# Patient Record
Sex: Female | Born: 1970 | Race: White | Hispanic: No | Marital: Single | Smoking: Current every day smoker
Health system: Southern US, Community
[De-identification: ages and names within clinical notes are randomized; demographics above are authoritative.]

## PROBLEM LIST (undated history)

## (undated) DIAGNOSIS — J45909 Unspecified asthma, uncomplicated: Secondary | ICD-10-CM

## (undated) DIAGNOSIS — I509 Heart failure, unspecified: Secondary | ICD-10-CM

## (undated) DIAGNOSIS — E119 Type 2 diabetes mellitus without complications: Secondary | ICD-10-CM

## (undated) DIAGNOSIS — D649 Anemia, unspecified: Secondary | ICD-10-CM

## (undated) DIAGNOSIS — I10 Essential (primary) hypertension: Secondary | ICD-10-CM

## (undated) DIAGNOSIS — D7389 Other diseases of spleen: Secondary | ICD-10-CM

## (undated) DIAGNOSIS — J449 Chronic obstructive pulmonary disease, unspecified: Secondary | ICD-10-CM

## (undated) DIAGNOSIS — I890 Lymphedema, not elsewhere classified: Secondary | ICD-10-CM

## (undated) HISTORY — DX: Heart failure, unspecified: I50.9

## (undated) HISTORY — DX: Other diseases of spleen: D73.89

## (undated) HISTORY — DX: Chronic obstructive pulmonary disease, unspecified: J44.9

## (undated) HISTORY — DX: Essential (primary) hypertension: I10

## (undated) HISTORY — DX: Type 2 diabetes mellitus without complications: E11.9

## (undated) HISTORY — DX: Anemia, unspecified: D64.9

## (undated) HISTORY — DX: Lymphedema, not elsewhere classified: I89.0

---

## 2017-02-22 ENCOUNTER — Emergency Department: Payer: Self-pay

## 2017-02-22 ENCOUNTER — Encounter: Payer: Self-pay | Admitting: Emergency Medicine

## 2017-02-22 ENCOUNTER — Emergency Department
Admission: EM | Admit: 2017-02-22 | Discharge: 2017-02-22 | Disposition: A | Discharge status: Home / Self Care | Payer: Self-pay | Attending: Emergency Medicine | Admitting: Emergency Medicine

## 2017-02-22 DIAGNOSIS — R0602 Shortness of breath: Secondary | ICD-10-CM | POA: Insufficient documentation

## 2017-02-22 LAB — URINALYSIS, ROUTINE W REFLEX MICROSCOPIC
BACTERIA UA: NONE SEEN
BILIRUBIN URINE: NEGATIVE
Glucose, UA: NEGATIVE mg/dL
KETONES UR: NEGATIVE mg/dL
NITRITE: NEGATIVE
PROTEIN: NEGATIVE mg/dL
Specific Gravity, Urine: 1.006 (ref 1.005–1.030)
pH: 5 (ref 5.0–8.0)

## 2017-02-22 LAB — CBC
HEMATOCRIT: 31 % — AB (ref 35.0–47.0)
Hemoglobin: 9.5 g/dL — ABNORMAL LOW (ref 12.0–16.0)
MCH: 22.2 pg — ABNORMAL LOW (ref 26.0–34.0)
MCHC: 30.8 g/dL — ABNORMAL LOW (ref 32.0–36.0)
MCV: 72 fL — ABNORMAL LOW (ref 80.0–100.0)
Platelets: 377 10*3/uL (ref 150–440)
RBC: 4.31 MIL/uL (ref 3.80–5.20)
RDW: 19.1 % — AB (ref 11.5–14.5)
WBC: 7.5 10*3/uL (ref 3.6–11.0)

## 2017-02-22 LAB — COMPREHENSIVE METABOLIC PANEL
ALBUMIN: 4.4 g/dL (ref 3.5–5.0)
ALK PHOS: 55 U/L (ref 38–126)
ALT: 12 U/L — AB (ref 14–54)
ANION GAP: 7 (ref 5–15)
AST: 18 U/L (ref 15–41)
BUN: 9 mg/dL (ref 6–20)
CALCIUM: 8.9 mg/dL (ref 8.9–10.3)
CO2: 25 mmol/L (ref 22–32)
Chloride: 106 mmol/L (ref 101–111)
Creatinine, Ser: 0.69 mg/dL (ref 0.44–1.00)
GFR calc Af Amer: >60 mL/min (ref >60)
GFR calc non Af Amer: >60 mL/min (ref >60)
GLUCOSE: 97 mg/dL (ref 65–99)
Potassium: 3.7 mmol/L (ref 3.5–5.1)
SODIUM: 138 mmol/L (ref 135–145)
Total Bilirubin: 0.6 mg/dL (ref 0.3–1.2)
Total Protein: 8.3 g/dL — ABNORMAL HIGH (ref 6.5–8.1)

## 2017-02-22 LAB — TROPONIN I
Troponin I: <0.03 ng/mL (ref <0.03)
Troponin I: <0.03 ng/mL (ref <0.03)

## 2017-02-22 NOTE — ED Provider Notes (Signed)
[facility] Emergency Department Provider Note  Time seen: 4:27 PM  I have reviewed the triage vital signs and the nursing notes.   HISTORY  Chief Complaint Shortness of Breath    HPI [name] is a 46 y.o. female who presents to the emergency department for shortness of breath. According to the patient for the past 3 days while ambulating she become short of breath and feels like she needs to stop and rest. Denies any chest pain. Denies any leg pain or swelling. Patient states she used to have similar symptoms when she was smoking however she quit 10 years ago. Denies any fever or increased cough or congestion. Denies any shortness of breath while lying in bed but states if she begins walking or exerting herself she becomes very short of breath.  History reviewed. No pertinent past medical history.  There are no active problems to display for this patient.   History reviewed. No pertinent surgical history.  Prior to Admission medications   Not on File    No Known Allergies  No family history on file.  Social History Social History  Substance Use Topics  . Smoking status: Former Games developermoker  . Smokeless tobacco: Not on file  . Alcohol use Not on file    Review of Systems Constitutional: Negative for fever. Cardiovascular: Negative for chest pain. Respiratory: Positive for shortness of breath with exertion, none at rest. Gastrointestinal: Negative for abdominal pain GU: Positive for Mild dysuria. Musculoskeletal: Negative for back pain. Negative for leg pain. Neurological: Negative for headache All other ROS negative  ____________________________________________   PHYSICAL EXAM:  VITAL SIGNS: ED Triage Vitals  Enc Vitals Group     BP 02/22/17 1351 131/61     Pulse Rate 02/22/17 1351 (!) 58     Resp 02/22/17 1351 20     Temp 02/22/17 1351 98 F (36.7 C)     Temp Source 02/22/17 1351 Oral     SpO2 02/22/17 1257 98 %     Weight  02/22/17 1351 275 lb (124.7 kg)     Height 02/22/17 1351 5\' 1"  (1.549 m)     Head Circumference --      Peak Flow --      Pain Score --      Pain Loc --      Pain Edu? --      Excl. in GC? --     Constitutional: Alert and oriented. Well appearing and in no distress. Eyes: Normal exam ENT   Head: Normocephalic and atraumatic.   Mouth/Throat: Mucous membranes are moist. Cardiovascular: Normal rate, regular rhythm. No murmur Respiratory: Normal respiratory effort without tachypnea nor retractions. Breath sounds are clear  Gastrointestinal: Soft and nontender. No distention.  Obese. Musculoskeletal: Nontender with normal range of motion in all extremities. No lower extremity tenderness. Mild nonpitting edema bilaterally. Neurologic:  Normal speech and language. No gross focal neurologic deficits  Skin:  Skin is warm, dry and intact.  Psychiatric: Mood and affect are normal.  ____________________________________________    EKG  EKG reviewed and interpreted by myself shows sinus bradycardia at 55 bpm, narrow QRS, normal axis, normal intervals, no ST changes. Normal EKG.  ____________________________________________    RADIOLOGY  Chest x-ray is negative  ____________________________________________   INITIAL IMPRESSION / ASSESSMENT AND PLAN / ED COURSE  Pertinent labs & imaging results that were available during my care of the patient were reviewed by me and considered in my medical decision making (see chart for  details).  Patient presents to the emergency department for 3 days of shortness of breath with minimal exertion. Denies any short of breath at rest. Denies any chest pain at any point. Overall the patient appears well. Clear lung sounds, normal respiratory rate, normal heart rate. 99-100% room air saturation. Patient denies any history of DVT/PE. Denies any increase in swelling in her legs. No recent surgery or immobilizations. Denies cough or estrogen use.  Patient is PERC negative. Given the patient's complaint of shortness breath with minimal exertion we will ambulate with a pulse oximeter. We'll obtain a repeat troponin. On review of systems the patient also states mild dysuria we will check a urinalysis sample. Patient agreeable to this plan.  Patient maintained a 98% room air saturation throughout ambulation. Repeat troponin is negative. I believe the patient is safe for discharge home. We will have the patient follow up with cardiology for further evaluation and possible stress test.  Patient agreeable to this plan. I discussed my normal chest pain/shortness breath return precautions. ____________________________________________   FINAL CLINICAL IMPRESSION(S) / ED DIAGNOSES  Dyspnea    Minna AntisPaduchowski, Darbi Chandran, MD 02/22/17 1742

## 2017-02-22 NOTE — ED Triage Notes (Signed)
States SOB with even slight exertion x 3 days. Denies pain or fevers. Denies cough. No unusual swelling in legs.

## 2017-02-22 NOTE — Discharge Instructions (Signed)
You have been seen in the emergency department today for shortness of breath. Your workup has shown normal results. As we discussed please follow-up with your primary care physician in the next 1-2 days for recheck. Return to the emergency department for any chest pain, worsening trouble breathing, or any other symptom personally concerning to yourself.  Please call the number provided for cardiology to arrange a follow-up appointments as possible for further evaluation and consideration of a stress test.

## 2017-05-04 DIAGNOSIS — E669 Obesity, unspecified: Secondary | ICD-10-CM | POA: Insufficient documentation

## 2017-05-04 DIAGNOSIS — R600 Localized edema: Secondary | ICD-10-CM | POA: Insufficient documentation

## 2017-05-04 DIAGNOSIS — D509 Iron deficiency anemia, unspecified: Secondary | ICD-10-CM | POA: Insufficient documentation

## 2017-05-04 DIAGNOSIS — R0789 Other chest pain: Secondary | ICD-10-CM | POA: Insufficient documentation

## 2017-05-04 DIAGNOSIS — R0600 Dyspnea, unspecified: Secondary | ICD-10-CM | POA: Insufficient documentation

## 2017-07-14 DIAGNOSIS — R7303 Prediabetes: Secondary | ICD-10-CM | POA: Insufficient documentation

## 2018-06-01 DIAGNOSIS — G4733 Obstructive sleep apnea (adult) (pediatric): Secondary | ICD-10-CM | POA: Insufficient documentation

## 2018-07-01 ENCOUNTER — Encounter: Payer: Self-pay | Admitting: Emergency Medicine

## 2018-07-01 ENCOUNTER — Emergency Department
Admission: EM | Admit: 2018-07-01 | Discharge: 2018-07-01 | Disposition: A | Discharge status: Home / Self Care | Payer: Self-pay | Attending: Emergency Medicine | Admitting: Emergency Medicine

## 2018-07-01 ENCOUNTER — Other Ambulatory Visit: Payer: Self-pay

## 2018-07-01 ENCOUNTER — Emergency Department: Payer: Self-pay

## 2018-07-01 DIAGNOSIS — J45909 Unspecified asthma, uncomplicated: Secondary | ICD-10-CM | POA: Insufficient documentation

## 2018-07-01 DIAGNOSIS — Z7984 Long term (current) use of oral hypoglycemic drugs: Secondary | ICD-10-CM | POA: Insufficient documentation

## 2018-07-01 DIAGNOSIS — M1711 Unilateral primary osteoarthritis, right knee: Secondary | ICD-10-CM | POA: Insufficient documentation

## 2018-07-01 DIAGNOSIS — M25561 Pain in right knee: Secondary | ICD-10-CM | POA: Insufficient documentation

## 2018-07-01 DIAGNOSIS — Z87891 Personal history of nicotine dependence: Secondary | ICD-10-CM | POA: Insufficient documentation

## 2018-07-01 HISTORY — DX: Unspecified asthma, uncomplicated: J45.909

## 2018-07-01 LAB — GLUCOSE, CAPILLARY: GLUCOSE-CAPILLARY: 103 mg/dL — AB (ref 70–99)

## 2018-07-01 MED ORDER — PREDNISONE 10 MG PO TABS: ORAL_TABLET | ORAL | 0 refills | Status: DC

## 2018-07-01 MED ORDER — HYDROCODONE-ACETAMINOPHEN 5-325 MG PO TABS: 1.0000 | ORAL_TABLET | Freq: Four times a day (QID) | ORAL | 0 refills | Status: DC | PRN

## 2018-07-01 NOTE — ED Triage Notes (Signed)
Pt to ED via POV c/o right knee pain. Pt states that she was trying to get into the bath tub last night and felt something pop in her right knee. When pt got up this morning she was not able to put a lot of pressure on her right knee. Pain has continued to get worse as the day has went on. Pt limping. Pt is in NAD at this time.

## 2018-07-01 NOTE — ED Provider Notes (Signed)
[facility] Emergency Department Provider Note  ____________________________________________   First MD Initiated Contact with Patient 07/01/18 1634     (approximate)  I have reviewed the triage vital signs and the nursing notes.   HISTORY  Chief Complaint Knee Pain   HPI [name] is a 47 y.o. female resents to the ED with complaint of right knee pain.  Patient states that she was trying to get into the bathtub last evening and felt a pop in her right knee.  She denies any previous injury to her knee.  She states that weightbearing increases her pain.  She states that she was at work today when she "could not stand it anymore".  Patient states that she has been limping and taking Goody powders without any relief of her pain.  Currently she rates her pain as 10/10.   Past Medical History:  Diagnosis Date  . Asthma     There are no active problems to display for this patient.   History reviewed. No pertinent surgical history.  Prior to Admission medications   Medication Sig Start Date End Date Taking? Authorizing Provider  Albuterol Sulfate 108 (90 Base) MCG/ACT AEPB Inhale into the lungs.   Yes [provider]  metFORMIN (GLUCOPHAGE) 500 MG tablet Take 500 mg by mouth 2 (two) times daily with a meal.   Yes [provider]  HYDROcodone-acetaminophen (NORCO/VICODIN) 5-325 MG tablet Take 1 tablet by mouth every 6 (six) hours as needed for moderate pain. 07/01/18   Tommi Rumps, PA-C  predniSONE (DELTASONE) 10 MG tablet Take 4 tablets for 2 days, 3 tablets for 2 days, 2 tablets for 2 days, 1 tablet for 2 days. 07/01/18   Tommi Rumps, PA-C    Allergies Patient has no known allergies.  No family history on file.  Social History Social History   Tobacco Use  . Smoking status: Former Games developer  . Smokeless tobacco: Never Used  Substance Use Topics  . Alcohol use: Not Currently  . Drug use: Not Currently    Review of  Systems Constitutional: No fever/chills Cardiovascular: Denies chest pain. Respiratory: Denies shortness of breath. Gastrointestinal: No abdominal pain.  No nausea, no vomiting.   Musculoskeletal: Positive for right knee pain. Skin: Negative for rash. Neurological: Negative for headaches, focal weakness or numbness.  ____________________________________________   PHYSICAL EXAM:  VITAL SIGNS: ED Triage Vitals  Enc Vitals Group     BP 07/01/18 1623 (!) 130/52     Pulse Rate 07/01/18 1623 87     Resp 07/01/18 1623 16     Temp 07/01/18 1623 97.8 F (36.6 C)     Temp Source 07/01/18 1623 Oral     SpO2 07/01/18 1623 98 %     Weight 07/01/18 1624 (!) 310 lb (140.6 kg)     Height 07/01/18 1624 5\' 1"  (1.549 m)     Head Circumference --      Peak Flow --      Pain Score 07/01/18 1624 10     Pain Loc --      Pain Edu? --      Excl. in GC? --    Constitutional: Alert and oriented. Well appearing and in no acute distress. Eyes: Conjunctivae are normal. PERRL. EOMI. Neck: No stridor.   Cardiovascular: Normal rate, regular rhythm. Grossly normal heart sounds.  Good peripheral circulation. Respiratory: Normal respiratory effort.  No retractions. Lungs CTAB. Gastrointestinal: Soft and nontender. No distention.  Obese. Musculoskeletal: On examination  of the right knee there is no gross deformity however patient is morbidly obese and landmarks are difficult to find.  Range of motion is restricted secondary to patient's pain.  There is some crepitus noted with range of motion.  No effusion is present.  Skin is intact and no discoloration or warmth is appreciated. Neurologic:  Normal speech and language. No gross focal neurologic deficits are appreciated.  Skin:  Skin is warm, dry and intact.  Psychiatric: Mood and affect are normal. Speech and behavior are normal.  ____________________________________________   LABS (all labs ordered are listed, but only abnormal results are  displayed)  Labs Reviewed  GLUCOSE, CAPILLARY - Abnormal; Notable for the following components:      Result Value   Glucose-Capillary 103 (*)    All other components within normal limits  CBG MONITORING, ED    RADIOLOGY  ED MD interpretation:  Right knee x-ray is negative for acute bony injury but does show degenerative changes.  Official radiology report(s): Dg Knee Complete 4 Views Right  Result Date: 07/01/2018 CLINICAL DATA:  47 year old female with right knee pain and popping EXAM: RIGHT KNEE - COMPLETE 4+ VIEW COMPARISON:  None. FINDINGS: No evidence of acute fracture or malalignment. 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 No evidence of knee joint effusion. No focal soft tissue abnormality. 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 Electronically Signed   By: Malachy MoanHeath  McCullough M.D.   On: 07/01/2018 17:03    ____________________________________________   PROCEDURES  Procedure(s) performed: None  Procedures  Critical Care performed: No  ____________________________________________   INITIAL IMPRESSION / ASSESSMENT AND PLAN / ED COURSE  As part of my medical decision making, I reviewed the following data within the electronic MEDICAL RECORD NUMBER Notes from prior ED visits and [place] Controlled Substance Database  Patient presents to the ED with complaint of right knee pain since trying to get into the bathtub last evening.  She states that it popped and since that time she has had pain.  She has been taking multiple BC powders without any relief.  Patient denies any previous injury to her knee.  She is still ambulatory but with a limp.  On examination of the knee landmarks are difficult to determine due to patient habitus.  Crepitus is appreciated with restricted range of motion  secondary to pain.  X-ray of the right knee confirms tricompartmentmental degenerative osteoarthritis.  Patient was placed in a knee immobilizer for support for limited use.  She is also given a prescription for prednisone tapering dose starting at 40 mg and Norco as needed for pain.  She is encouraged to ice and elevate this evening.  She was given a note to remain out of work this weekend.  She is to follow-up with Dr. Odis LusterBowers who is the orthopedist on-call.   ____________________________________________   FINAL CLINICAL IMPRESSION(S) / ED DIAGNOSES  Final diagnoses:  Acute pain of right knee  Osteoarthritis of right knee, unspecified osteoarthritis type     ED Discharge Orders         Ordered    predniSONE (DELTASONE) 10 MG tablet     07/01/18 1734    HYDROcodone-acetaminophen (NORCO/VICODIN) 5-325 MG tablet  Every 6 hours PRN     07/01/18 1734           Note:  This document was prepared using Dragon voice recognition software and  may include unintentional dictation errors.    Tommi Rumps, PA-C 07/01/18 1741    Phineas Semen, MD 07/01/18 2008

## 2018-07-01 NOTE — Discharge Instructions (Signed)
Follow-up with Dr. Odis LusterBowers or orthopedist of your choice if any continued problems with your knee.  Ice and elevation tonight.  Wear knee immobilizer for support.  You may take this off to sleep but should be worn anytime you are up walking.  [facility] or Goody powders as this will cause some stomach upset and you are taking too many.  Begin taking the prednisone as directed and if severe pain take Norco.  Do not take at the pain medication, Norco, if you are driving or working.

## 2019-08-25 ENCOUNTER — Ambulatory Visit: Payer: Self-pay | Attending: Internal Medicine

## 2019-08-25 DIAGNOSIS — Z20822 Contact with and (suspected) exposure to covid-19: Secondary | ICD-10-CM | POA: Insufficient documentation

## 2019-08-26 LAB — NOVEL CORONAVIRUS, NAA: SARS-CoV-2, NAA: NOT DETECTED

## 2019-12-07 ENCOUNTER — Emergency Department
Admission: EM | Admit: 2019-12-07 | Discharge: 2019-12-07 | Disposition: A | Discharge status: Home / Self Care | Payer: Self-pay | Attending: Emergency Medicine | Admitting: Emergency Medicine

## 2019-12-07 ENCOUNTER — Emergency Department: Payer: Self-pay

## 2019-12-07 ENCOUNTER — Other Ambulatory Visit: Payer: Self-pay

## 2019-12-07 ENCOUNTER — Encounter: Payer: Self-pay | Admitting: Emergency Medicine

## 2019-12-07 DIAGNOSIS — Z79899 Other long term (current) drug therapy: Secondary | ICD-10-CM | POA: Insufficient documentation

## 2019-12-07 DIAGNOSIS — J45909 Unspecified asthma, uncomplicated: Secondary | ICD-10-CM | POA: Insufficient documentation

## 2019-12-07 DIAGNOSIS — Z87891 Personal history of nicotine dependence: Secondary | ICD-10-CM | POA: Insufficient documentation

## 2019-12-07 DIAGNOSIS — M25561 Pain in right knee: Secondary | ICD-10-CM | POA: Insufficient documentation

## 2019-12-07 MED ORDER — LIDOCAINE 5 % EX PTCH
1.0000 | MEDICATED_PATCH | CUTANEOUS | Status: DC
  Administered 2019-12-07: 1 via TRANSDERMAL
  Filled 2019-12-07: qty 1

## 2019-12-07 MED ORDER — TRAMADOL HCL 50 MG PO TABS
50.0000 mg | ORAL_TABLET | Freq: Once | ORAL | Status: AC
  Administered 2019-12-07: 50 mg via ORAL
  Filled 2019-12-07: qty 1

## 2019-12-07 MED ORDER — TRAMADOL HCL 50 MG PO TABS: 50.0000 mg | ORAL_TABLET | Freq: Four times a day (QID) | ORAL | 0 refills | Status: DC | PRN

## 2019-12-07 MED ORDER — MELOXICAM 15 MG PO TABS: 15.0000 mg | ORAL_TABLET | Freq: Every day | ORAL | 2 refills | Status: DC

## 2019-12-07 NOTE — ED Provider Notes (Signed)
[facility] Emergency Department Provider Note   ____________________________________________   First MD Initiated Contact with Patient 12/07/19 [id number]     (approximate)  I have reviewed the triage vital signs and the nursing notes.   HISTORY  Chief Complaint Knee Pain    HPI [name] is a 49 y.o. female patient presents with acute right knee pain.  Patient has a history of chronic knee pain secondary to osteoarthritis.  Patient pain has increased in the past week.  Patient rates pain as a 6/10.  Patient described the pain as achy.  Pain increased with weightbearing and ambulation.  Patient  lack assurance has hindered her for seeking care with orthopedics.         Past Medical History:  Diagnosis Date  . Asthma     There are no problems to display for this patient.   History reviewed. No pertinent surgical history.  Prior to Admission medications   Medication Sig Start Date End Date Taking? Authorizing Provider  Albuterol Sulfate 108 (90 Base) MCG/ACT AEPB Inhale into the lungs.    [provider]  meloxicam (MOBIC) 15 MG tablet Take 1 tablet (15 mg total) by mouth daily. 12/07/19   Joni Reining, PA-C  metFORMIN (GLUCOPHAGE) 500 MG tablet Take 500 mg by mouth 2 (two) times daily with a meal.    [provider]  traMADol (ULTRAM) 50 MG tablet Take 1 tablet (50 mg total) by mouth every 6 (six) hours as needed. 12/07/19 12/06/20  Joni Reining, PA-C    Allergies Patient has no known allergies.  No family history on file.  Social History Social History   Tobacco Use  . Smoking status: Former Games developer  . Smokeless tobacco: Never Used  Substance Use Topics  . Alcohol use: Not Currently  . Drug use: Not Currently    Review of Systems Constitutional: No fever/chills Eyes: No visual changes. ENT: No sore throat. Cardiovascular: Denies chest pain. Respiratory: Denies shortness of breath. Gastrointestinal: No  abdominal pain.  No nausea, no vomiting.  No diarrhea.  No constipation. Genitourinary: Negative for dysuria. Musculoskeletal: Right knee pain Skin: Negative for rash. Neurological: Negative for headaches, focal weakness or numbness. Endocrine:  Diabetes ____________________________________________   PHYSICAL EXAM:  VITAL SIGNS: ED Triage Vitals  Enc Vitals Group     BP 12/07/19 0853 (!) 143/89     Pulse Rate 12/07/19 0851 70     Resp 12/07/19 0851 20     Temp 12/07/19 0851 97.8 F (36.6 C)     Temp src --      SpO2 12/07/19 0851 98 %     Weight 12/07/19 0854 300 lb (136.1 kg)     Height 12/07/19 0854 5\' 1"  (1.549 m)     Head Circumference --      Peak Flow --      Pain Score 12/07/19 0854 6     Pain Loc --      Pain Edu? --      Excl. in GC? --     Constitutional: Alert and oriented. Well appearing and in no acute distress.  Morbid obesity Cardiovascular: Normal rate, regular rhythm. Grossly normal heart sounds.  Good peripheral circulation. Respiratory: Normal respiratory effort.  No retractions. Lungs CTAB. Musculoskeletal: Body habitus limits the exam.  No obvious deformity to the right knee.  Patient is moderate guarding palpation of medial aspect of the knee.  Patient has full equal range of motion in the sitting position.  Neurologic:  Normal speech and language. No gross focal neurologic deficits are appreciated. No gait instability. Skin:  Skin is warm, dry and intact. No rash noted. Psychiatric: Mood and affect are normal. Speech and behavior are normal.  ____________________________________________   LABS (all labs ordered are listed, but only abnormal results are displayed)  Labs Reviewed - No data to display ____________________________________________  EKG   ____________________________________________  RADIOLOGY  ED MD interpretation:    Official radiology report(s): DG Knee Complete 4 Views Right  Result Date: 12/07/2019 CLINICAL DATA:   Worsening chronic right knee pain. History of arthritis. EXAM: RIGHT KNEE - COMPLETE 4+ VIEW COMPARISON:  07/01/2018 FINDINGS: No acute fracture, dislocation, or knee joint effusion is identified. Moderate to severe medial compartment joint space narrowing is unchanged as is moderate tricompartmental marginal osteophytosis. The soft tissues are unremarkable. IMPRESSION: 1. No acute osseous abnormality. 2. Unchanged tricompartmental osteoarthrosis, greatest medially. Electronically Signed   By: Logan Bores M.D.   On: 12/07/2019 09:51    ____________________________________________   PROCEDURES  Procedure(s) performed (including Critical Care):  Procedures   ____________________________________________   INITIAL IMPRESSION / ASSESSMENT AND PLAN / ED COURSE  As part of my medical decision making, I reviewed the following data within the [facility]      Right knee pain secondary to osteoarthritis.  Discussed x-ray findings with patient which shows no change from the views taken 2019.  Discussed sequela of arthritis with patient.  Patient given discharge care instruction advised [facility].  Take medication as directed.    [name] was evaluated in Emergency Department on 12/07/2019 for the symptoms described in the history of present illness. She was evaluated in the context of the global COVID-19 pandemic, which necessitated consideration that the patient might be at risk for infection with the SARS-CoV-2 virus that causes COVID-19. Institutional protocols and algorithms that pertain to the evaluation of patients at risk for COVID-19 are in a state of rapid change based on information released by regulatory bodies including the CDC and federal and state organizations. These policies and algorithms were followed during the patient's care in the ED.       ____________________________________________   FINAL CLINICAL IMPRESSION(S) / ED  DIAGNOSES  Final diagnoses:  Acute pain of right knee     ED Discharge Orders         Ordered    traMADol (ULTRAM) 50 MG tablet  Every 6 hours PRN     12/07/19 1003    meloxicam (MOBIC) 15 MG tablet  Daily     12/07/19 1003           Note:  This document was prepared using Dragon voice recognition software and may include unintentional dictation errors.    Sable Feil, PA-C 12/07/19 Wiley Ford, MD 12/08/19 [phone]

## 2019-12-07 NOTE — ED Notes (Signed)
See triage note  Presents with right knee pain  Denies any injury to knee   States pain has been there for about 2 years

## 2019-12-07 NOTE — ED Triage Notes (Signed)
C/O right knee pain. States pain has been ongoing for the last couple of years, but worse over past week.  Pain worse when putting weight on knee. Denies injury.

## 2019-12-07 NOTE — Discharge Instructions (Signed)
Follow discharge care instruction take medication as directed. °

## 2020-02-06 ENCOUNTER — Ambulatory Visit: Payer: Self-pay

## 2020-02-08 ENCOUNTER — Other Ambulatory Visit: Payer: Self-pay

## 2020-02-08 ENCOUNTER — Other Ambulatory Visit: Payer: Self-pay | Admitting: Gerontology

## 2020-02-08 ENCOUNTER — Ambulatory Visit: Payer: Self-pay | Admitting: Gerontology

## 2020-02-08 ENCOUNTER — Encounter: Payer: Self-pay | Admitting: Gerontology

## 2020-02-08 VITALS — BP 139/73 | HR 66 | Ht 62.0 in | Wt 319.0 lb

## 2020-02-08 DIAGNOSIS — Z7689 Persons encountering health services in other specified circumstances: Secondary | ICD-10-CM

## 2020-02-08 DIAGNOSIS — M25561 Pain in right knee: Secondary | ICD-10-CM

## 2020-02-08 DIAGNOSIS — E119 Type 2 diabetes mellitus without complications: Secondary | ICD-10-CM | POA: Insufficient documentation

## 2020-02-08 DIAGNOSIS — Z8709 Personal history of other diseases of the respiratory system: Secondary | ICD-10-CM | POA: Insufficient documentation

## 2020-02-08 DIAGNOSIS — F17200 Nicotine dependence, unspecified, uncomplicated: Secondary | ICD-10-CM

## 2020-02-08 LAB — POCT GLYCOSYLATED HEMOGLOBIN (HGB A1C): Hemoglobin A1C: 6.6 % — AB (ref 4.0–5.6)

## 2020-02-08 MED ORDER — MELOXICAM 15 MG PO TABS: 15.0000 mg | ORAL_TABLET | Freq: Every day | ORAL | 2 refills | Status: DC

## 2020-02-08 MED ORDER — METFORMIN HCL 500 MG PO TABS: 500.0000 mg | ORAL_TABLET | Freq: Every day | ORAL | 0 refills | Status: DC

## 2020-02-08 MED ORDER — ALBUTEROL SULFATE HFA 108 (90 BASE) MCG/ACT IN AERS
2.0000 | INHALATION_SPRAY | Freq: Four times a day (QID) | RESPIRATORY_TRACT | 3 refills | Status: DC | PRN
Start: 2020-02-08 — End: 2020-06-26

## 2020-02-08 MED ORDER — FLUTICASONE-SALMETEROL 250-50 MCG/DOSE IN AEPB: 1.0000 | INHALATION_SPRAY | Freq: Two times a day (BID) | RESPIRATORY_TRACT | 3 refills | Status: DC

## 2020-02-08 NOTE — Progress Notes (Signed)
Patient ID: [name], female   DOB: 1970/09/22, 49 y.o.   MRN: [id number]  Chief Complaint  Patient presents with  . Establish Care  . Knee Pain    Right knee, swelling around ankle    HPI [name] is a 49 y.o. female who presents to establish care and evaluation of her chronic conditions. She was seen at the ED on 12/07/2019 for right knee pain. Imaging done showed No acute osseous abnormality.  Unchanged tricompartmental osteoarthrosis, greatest medially, per Dr Jeralyn Ruths A. Currently, she continues to experience constant sharp 6-8/10 pain, which radiates to her lower leg. Walking aggravates pain, and taking Tylenol and Ibuprofen minimally relieves symptoms. She states that she has picked up Meloxicam from the Pharmacy because of cost.  She also states that she was diagnosed with Type 2 diabetes and has not been taking Metformin. Her HgbA1c was done during visit was 6.6% and her blood glucose reading was 179 mg/dl. She denies hypoglycemic/hyperglycemic symptoms, and peripheral neuropathy. She also has a history of COPD and unable to afford her inhaler. She states that her breathing is stable and continues to smoke less than one pack of cigarette daily and admits the desire to quit. She has not had Mammogram, Pap smear nor Colonoscopy done. Overall, she states that she's doing well and offers no further complaint.   Past Medical History:  Diagnosis Date  . Asthma     No past surgical history on file.  No family history on file.  Social History Social History   Tobacco Use  . Smoking status: Current Every Day Smoker    Packs/day: 1.00    Types: Cigarettes  . Smokeless tobacco: Never Used  Vaping Use  . Vaping Use: Never used  Substance Use Topics  . Alcohol use: Not Currently  . Drug use: Not Currently    No Known Allergies  Current Outpatient Medications  Medication Sig Dispense Refill  . albuterol (VENTOLIN HFA) 108 (90 Base) MCG/ACT inhaler Inhale 2 puffs into the lungs  every 6 (six) hours as needed for wheezing or shortness of breath. 18 g 3  . Fluticasone-Salmeterol (ADVAIR) 250-50 MCG/DOSE AEPB Inhale 1 puff into the lungs 2 (two) times daily. 60 each 3  . meloxicam (MOBIC) 15 MG tablet Take 1 tablet (15 mg total) by mouth daily. 30 tablet 2  . metFORMIN (GLUCOPHAGE) 500 MG tablet Take 1 tablet (500 mg total) by mouth daily with breakfast. 30 tablet 0  . traMADol (ULTRAM) 50 MG tablet Take 1 tablet (50 mg total) by mouth every 6 (six) hours as needed. (Patient not taking: Reported on 02/08/2020) 20 tablet 0   No current facility-administered medications for this visit.    Review of Systems Review of Systems  Constitutional: Negative.   HENT: Negative.   Eyes: Negative.   Respiratory: Negative.   Cardiovascular: Negative.   Gastrointestinal: Negative.   Endocrine: Negative.   Genitourinary: Negative.   Musculoskeletal: Positive for arthralgias (right knee pain).  Skin: Negative.   Neurological: Negative.   Hematological: Negative.   Psychiatric/Behavioral: Negative.     Blood pressure 139/73, pulse 66, height _0  (1.575 m), weight (!) 319 lb (144.7 kg), SpO2 95 %.  Physical Exam Physical Exam Constitutional:      Appearance: Normal appearance.  HENT:     Head: Normocephalic and atraumatic.     Nose: Nose normal.     Mouth/Throat:     Mouth: Mucous membranes are moist.  Eyes:     Pupils: Pupils  are equal, round, and reactive to light.  Cardiovascular:     Rate and Rhythm: Normal rate and regular rhythm.     Pulses: Normal pulses.     Heart sounds: Normal heart sounds.  Pulmonary:     Effort: Pulmonary effort is normal.     Breath sounds: Examination of the right-upper field reveals wheezing. Examination of the left-upper field reveals wheezing. Examination of the right-middle field reveals wheezing. Examination of the left-middle field reveals wheezing. Wheezing (expiratory wheezes) present.  Abdominal:     General: Bowel sounds are  normal.  Genitourinary:    Comments: Deferred per Patient. Musculoskeletal:        General: Tenderness (to right knee with palpation) present.     Cervical back: Normal range of motion.  Skin:    General: Skin is warm and dry.  Neurological:     General: No focal deficit present.     Mental Status: She is alert and oriented to person, place, and time. Mental status is at baseline.  Psychiatric:        Mood and Affect: Mood normal.        Behavior: Behavior normal.        Thought Content: Thought content normal.        Judgment: Judgment normal.     Data Reviewed Lab and Past medical history was reviewed.  Assessment and Plan  1. Encounter to establish care -Routine labs will be checked. - Ambulatory referral to Hematology / Oncology for Mammogram and Pap smear - Ambulatory referral to Gastroenterology for Colonoscopy - CBC w/Diff; Future - Comp Met (CMET); Future - Lipid panel; Future - Urinalysis; Future - TSH; Future  2. Right knee pain, unspecified chronicity - She will continue on Meloxicam and follow up with [facility] Orthopedic Surgeon Dr Vickki Hearing. - meloxicam (MOBIC) 15 MG tablet; Take 1 tablet (15 mg total) by mouth daily.  Dispense: 30 tablet; Refill: 2  3. History of COPD - She has expiratory wheezes, and will start on Advair and Albuterol as needed. She was educated on medication side effects and advised to notify clinic. - albuterol (VENTOLIN HFA) 108 (90 Base) MCG/ACT inhaler; Inhale 2 puffs into the lungs every 6 (six) hours as needed for wheezing or shortness of breath.  Dispense: 18 g; Refill: 3 - Fluticasone-Salmeterol (ADVAIR) 250-50 MCG/DOSE AEPB; Inhale 1 puff into the lungs 2 (two) times daily.  Dispense: 60 each; Refill: 3  4. Type 2 diabetes mellitus without complication, without long-term current use of insulin (HCC) - Her HgbA1c was 6.6%, she will start on Metformin, was educated on medication side effects and advised to notify clinic. She was also  encouraged to continue on Low carb /non concentrated sweet diet and exercise as tolerated. - metFORMIN (GLUCOPHAGE) 500 MG tablet; Take 1 tablet (500 mg total) by mouth daily with breakfast.  Dispense: 30 tablet; Refill: 0 - POCT HgB A1C; Future - POCT HgB A1C - Urine Microalbumin w/creat. ratio; Future  5. Smoking - She was advised on smoking cessation and provided with [place] Quit line information  Follow Up: 02/28/2020 if symptoms worsen or fail to improve.   Zacherie Honeyman E Loyal Holzheimer 02/08/2020, 12:20 PM

## 2020-02-08 NOTE — Patient Instructions (Signed)
Smoking Tobacco Information, Adult Smoking tobacco can be harmful to your health. Tobacco contains a poisonous (toxic), colorless chemical called nicotine. Nicotine is addictive. It changes the brain and can make it hard to stop smoking. Tobacco also has other toxic chemicals that can hurt your body and raise your risk of many cancers. How can smoking tobacco affect me? Smoking tobacco puts you at risk for:  Cancer. Smoking is most commonly associated with lung cancer, but can also lead to cancer in other parts of the body.  Chronic obstructive pulmonary disease (COPD). This is a long-term lung condition that makes it hard to breathe. It also gets worse over time.  High blood pressure (hypertension), heart disease, stroke, or heart attack.  Lung infections, such as pneumonia.  Cataracts. This is when the lenses in the eyes become clouded.  Digestive problems. This may include peptic ulcers, heartburn, and gastroesophageal reflux disease (GERD).  Oral health problems, such as gum disease and tooth loss.  Loss of taste and smell. Smoking can affect your appearance by causing:  Wrinkles.  Yellow or stained teeth, fingers, and fingernails. Smoking tobacco can also affect your social life, because:  It may be challenging to find places to smoke when away from home. Many workplaces, restaurants, hotels, and public places are tobacco-free.  Smoking is expensive. This is due to the cost of tobacco and the long-term costs of treating health problems from smoking.  Secondhand smoke may affect those around you. Secondhand smoke can cause lung cancer, breathing problems, and heart disease. Children of smokers have a higher risk for: ? Sudden infant death syndrome (SIDS). ? Ear infections. ? Lung infections. If you currently smoke tobacco, quitting now can help you:  Lead a longer and healthier life.  Look, smell, breathe, and feel better over time.  Save money.  Protect others from the  harms of secondhand smoke. What actions can I take to prevent health problems? Quit smoking   Do not start smoking. Quit if you already do.  Make a plan to quit smoking and commit to it. Look for programs to help you and ask your health care provider for recommendations and ideas.  Set a date and write down all the reasons you want to quit.  Let your friends and family know you are quitting so they can help and support you. Consider finding friends who also want to quit. It can be easier to quit with someone else, so that you can support each other.  Talk with your health care provider about using nicotine replacement medicines to help you quit, such as gum, lozenges, patches, sprays, or pills.  Do not replace cigarette smoking with electronic cigarettes, which are commonly called e-cigarettes. The safety of e-cigarettes is not known, and some may contain harmful chemicals.  If you try to quit but return to smoking, stay positive. It is common to slip up when you first quit, so take it one day at a time.  Be prepared for cravings. When you feel the urge to smoke, chew gum or suck on hard candy. Lifestyle  Stay busy and take care of your body.  Drink enough fluid to keep your urine pale yellow.  Get plenty of exercise and eat a healthy diet. This can help prevent weight gain after quitting.  Monitor your eating habits. Quitting smoking can cause you to have a larger appetite than when you smoke.  Find ways to relax. Go out with friends or family to a movie or a restaurant   where people do not smoke.  Ask your health care provider about having regular tests (screenings) to check for cancer. This may include blood tests, imaging tests, and other tests.  Find ways to manage your stress, such as meditation, yoga, or exercise. Where to find support To get support to quit smoking, consider:  Asking your health care provider for more information and resources.  Taking classes to learn  more about quitting smoking.  Looking for local organizations that offer resources about quitting smoking.  Joining a support group for people who want to quit smoking in your local community.  Calling the smokefree.gov counselor helpline: 1-800-Quit-Now 2016916937) Where to find more information You may find more information about quitting smoking from:  HelpGuide.org: www.helpguide.org  BankRights.uy: smokefree.gov  American Lung Association: www.lung.org Contact a health care provider if you:  Have problems breathing.  Notice that your lips, nose, or fingers turn blue.  Have chest pain.  Are coughing up blood.  Feel faint or you pass out.  Have other health changes that cause you to worry. Summary  Smoking tobacco can negatively affect your health, the health of those around you, your finances, and your social life.  Do not start smoking. Quit if you already do. If you need help quitting, ask your health care provider.  Think about joining a support group for people who want to quit smoking in your local community. There are many effective programs that will help you to quit this behavior. This information is not intended to replace advice given to you by your health care provider. Make sure you discuss any questions you have with your health care provider. Document Revised: 04/07/2019 Document Reviewed: 07/28/2016 Elsevier Patient Education  2020 Elsevier Inc. Carbohydrate Counting for Diabetes Mellitus, Adult  Carbohydrate counting is a method of keeping track of how many carbohydrates you eat. Eating carbohydrates naturally increases the amount of sugar (glucose) in the blood. Counting how many carbohydrates you eat helps keep your blood glucose within normal limits, which helps you manage your diabetes (diabetes mellitus). It is important to know how many carbohydrates you can safely have in each meal. This is different for every person. A diet and nutrition  specialist (registered dietitian) can help you make a meal plan and calculate how many carbohydrates you should have at each meal and snack. Carbohydrates are found in the following foods:  Grains, such as breads and cereals.  Dried beans and soy products.  Starchy vegetables, such as potatoes, peas, and corn.  Fruit and fruit juices.  Milk and yogurt.  Sweets and snack foods, such as cake, cookies, candy, chips, and soft drinks. How do I count carbohydrates? There are two ways to count carbohydrates in food. You can use either of the methods or a combination of both. Reading "Nutrition Facts" on packaged food The "Nutrition Facts" list is included on the labels of almost all packaged foods and beverages in the [place] It includes:  The serving size.  Information about nutrients in each serving, including the grams (g) of carbohydrate per serving. To use the "Nutrition Facts":  Decide how many servings you will have.  Multiply the number of servings by the number of carbohydrates per serving.  The resulting number is the total amount of carbohydrates that you will be having. Learning standard serving sizes of other foods When you eat carbohydrate foods that are not packaged or do not include "Nutrition Facts" on the label, you need to measure the servings in order to count  the amount of carbohydrates:  Measure the foods that you will eat with a food scale or measuring cup, if needed.  Decide how many standard-size servings you will eat.  Multiply the number of servings by 15. Most carbohydrate-rich foods have about 15 g of carbohydrates per serving. ? For example, if you eat 8 oz (170 g) of strawberries, you will have eaten 2 servings and 30 g of carbohydrates (2 servings x 15 g = 30 g).  For foods that have more than one food mixed, such as soups and casseroles, you must count the carbohydrates in each food that is included. The following list contains standard serving sizes of  common carbohydrate-rich foods. Each of these servings has about 15 g of carbohydrates:   hamburger bun or  English muffin.   oz (15 mL) syrup.   oz (14 g) jelly.  1 slice of bread.  1 six-inch tortilla.  3 oz (85 g) cooked rice or pasta.  4 oz (113 g) cooked dried beans.  4 oz (113 g) starchy vegetable, such as peas, corn, or potatoes.  4 oz (113 g) hot cereal.  4 oz (113 g) mashed potatoes or  of a large baked potato.  4 oz (113 g) canned or frozen fruit.  4 oz (120 mL) fruit juice.  4-6 crackers.  6 chicken nuggets.  6 oz (170 g) unsweetened dry cereal.  6 oz (170 g) plain fat-free yogurt or yogurt sweetened with artificial sweeteners.  8 oz (240 mL) milk.  8 oz (170 g) fresh fruit or one small piece of fruit.  24 oz (680 g) popped popcorn. Example of carbohydrate counting Sample meal  3 oz (85 g) chicken breast.  6 oz (170 g) brown rice.  4 oz (113 g) corn.  8 oz (240 mL) milk.  8 oz (170 g) strawberries with sugar-free whipped topping. Carbohydrate calculation 1. Identify the foods that contain carbohydrates: ? Rice. ? Corn. ? Milk. ? Strawberries. 2. Calculate how many servings you have of each food: ? 2 servings rice. ? 1 serving corn. ? 1 serving milk. ? 1 serving strawberries. 3. Multiply each number of servings by 15 g: ? 2 servings rice x 15 g = 30 g. ? 1 serving corn x 15 g = 15 g. ? 1 serving milk x 15 g = 15 g. ? 1 serving strawberries x 15 g = 15 g. 4. Add together all of the amounts to find the total grams of carbohydrates eaten: ? 30 g + 15 g + 15 g + 15 g = 75 g of carbohydrates total. Summary  Carbohydrate counting is a method of keeping track of how many carbohydrates you eat.  Eating carbohydrates naturally increases the amount of sugar (glucose) in the blood.  Counting how many carbohydrates you eat helps keep your blood glucose within normal limits, which helps you manage your diabetes.  A diet and nutrition  specialist (registered dietitian) can help you make a meal plan and calculate how many carbohydrates you should have at each meal and snack. This information is not intended to replace advice given to you by your health care provider. Make sure you discuss any questions you have with your health care provider. Document Revised: 02/04/2017 Document Reviewed: 12/25/2015 Elsevier Patient Education  [place].

## 2020-02-13 ENCOUNTER — Other Ambulatory Visit: Payer: Self-pay

## 2020-02-13 ENCOUNTER — Ambulatory Visit: Payer: Self-pay | Admitting: Pharmacy Technician

## 2020-02-13 DIAGNOSIS — Z79899 Other long term (current) drug therapy: Secondary | ICD-10-CM

## 2020-02-13 NOTE — Progress Notes (Signed)
Completed [facility] application.  Patient agreed to all terms of the [facility] contract.  Mailing contract to patient to sign.   Patient approved to receive medication assistance at [facility] until time for re-certification in 8864, and as long as eligibility criteria continues to be met.    Provided patient with community resource material based on her particular needs.    [facility]

## 2020-02-21 ENCOUNTER — Other Ambulatory Visit: Payer: Self-pay

## 2020-02-21 DIAGNOSIS — E119 Type 2 diabetes mellitus without complications: Secondary | ICD-10-CM

## 2020-02-21 DIAGNOSIS — Z7689 Persons encountering health services in other specified circumstances: Secondary | ICD-10-CM

## 2020-02-22 LAB — CBC WITH DIFFERENTIAL/PLATELET
Basophils Absolute: 0.1 10*3/uL (ref 0.0–0.2)
Basos: 1 %
EOS (ABSOLUTE): 0.3 10*3/uL (ref 0.0–0.4)
Eos: 3 %
Hematocrit: 27.8 % — ABNORMAL LOW (ref 34.0–46.6)
Hemoglobin: 8.2 g/dL — ABNORMAL LOW (ref 11.1–15.9)
Immature Grans (Abs): 0 10*3/uL (ref 0.0–0.1)
Immature Granulocytes: 0 %
Lymphocytes Absolute: 2.4 10*3/uL (ref 0.7–3.1)
Lymphs: 22 %
MCH: 20.2 pg — ABNORMAL LOW (ref 26.6–33.0)
MCHC: 29.5 g/dL — ABNORMAL LOW (ref 31.5–35.7)
MCV: 69 fL — ABNORMAL LOW (ref 79–97)
Monocytes Absolute: 0.8 10*3/uL (ref 0.1–0.9)
Monocytes: 8 %
Neutrophils Absolute: 7.1 10*3/uL — ABNORMAL HIGH (ref 1.4–7.0)
Neutrophils: 66 %
Platelets: 254 10*3/uL (ref 150–450)
RBC: 4.06 x10E6/uL (ref 3.77–5.28)
RDW: 18.2 % — ABNORMAL HIGH (ref 11.7–15.4)
WBC: 10.7 10*3/uL (ref 3.4–10.8)

## 2020-02-22 LAB — URINALYSIS
Bilirubin, UA: NEGATIVE
Glucose, UA: NEGATIVE
Ketones, UA: NEGATIVE
Leukocytes,UA: NEGATIVE
Nitrite, UA: NEGATIVE
Protein,UA: NEGATIVE
RBC, UA: NEGATIVE
Specific Gravity, UA: 1.015 (ref 1.005–1.030)
Urobilinogen, Ur: 1 mg/dL (ref 0.2–1.0)
pH, UA: 6.5 (ref 5.0–7.5)

## 2020-02-22 LAB — COMPREHENSIVE METABOLIC PANEL
ALT: 9 IU/L (ref 0–32)
AST: 13 IU/L (ref 0–40)
Albumin/Globulin Ratio: 1.3 (ref 1.2–2.2)
Albumin: 4.1 g/dL (ref 3.8–4.8)
Alkaline Phosphatase: 64 IU/L (ref 48–121)
BUN/Creatinine Ratio: 14 (ref 9–23)
BUN: 11 mg/dL (ref 6–24)
Bilirubin Total: <0.2 mg/dL (ref 0.0–1.2)
CO2: 23 mmol/L (ref 20–29)
Calcium: 8.8 mg/dL (ref 8.7–10.2)
Chloride: 101 mmol/L (ref 96–106)
Creatinine, Ser: 0.81 mg/dL (ref 0.57–1.00)
GFR calc Af Amer: 98 mL/min/{1.73_m2} (ref >59)
GFR calc non Af Amer: 85 mL/min/{1.73_m2} (ref >59)
Globulin, Total: 3.1 g/dL (ref 1.5–4.5)
Glucose: 133 mg/dL — ABNORMAL HIGH (ref 65–99)
Potassium: 4 mmol/L (ref 3.5–5.2)
Sodium: 137 mmol/L (ref 134–144)
Total Protein: 7.2 g/dL (ref 6.0–8.5)

## 2020-02-22 LAB — TSH: TSH: 5.23 u[IU]/mL — ABNORMAL HIGH (ref 0.450–4.500)

## 2020-02-22 LAB — LIPID PANEL
Chol/HDL Ratio: 4.3 ratio (ref 0.0–4.4)
Cholesterol, Total: 137 mg/dL (ref 100–199)
HDL: 32 mg/dL — ABNORMAL LOW (ref >39)
LDL Chol Calc (NIH): 73 mg/dL (ref 0–99)
Triglycerides: 186 mg/dL — ABNORMAL HIGH (ref 0–149)
VLDL Cholesterol Cal: 32 mg/dL (ref 5–40)

## 2020-02-22 LAB — MICROALBUMIN / CREATININE URINE RATIO
Creatinine, Urine: 80.6 mg/dL
Microalb/Creat Ratio: 14 mg/g creat (ref 0–29)
Microalbumin, Urine: 11.4 ug/mL

## 2020-02-26 ENCOUNTER — Telehealth: Payer: Self-pay | Admitting: Pharmacist

## 2020-02-26 NOTE — Telephone Encounter (Signed)
02/26/2020 3:09:00 PM - ProAirHFA & Advair to pat. & dr  -- Annette Johnson - Monday, February 26, 2020 3:06 PM --Received pharmacy printout to order Advair 250/50 Inhale 1 puff into the lungs two times a day & Albjterol HFA 90mcg--ordering ProAir HFA per [facility]-Inhale 2 puffs into the lungs every 6 hours as needed for wheezing or shortness of breath. Printed applications, mailing patient her portion to sign & return, also sending to [facility] for Elizabeth to sign. 

## 2020-02-27 ENCOUNTER — Ambulatory Visit: Payer: Self-pay | Admitting: Gerontology

## 2020-03-04 ENCOUNTER — Telehealth: Payer: Self-pay

## 2020-03-04 ENCOUNTER — Telehealth: Payer: Self-pay | Admitting: Pharmacist

## 2020-03-04 NOTE — Telephone Encounter (Signed)
LVM for pt to contact the office to reschedule her nurse triage visit.  I was unable to contact her initially.  I've asked front office to try to place her on the schedule for tomorrow if she is available.  Thanks,  [place], [place]

## 2020-03-04 NOTE — Telephone Encounter (Signed)
03/04/2020 4:12:03 PM - Advair & ProAir HFA pending  -- Rhetta Mura - Monday, March 04, 2020 4:11 PM --I have received the signed provider portion of applications for ProAir HFA & Advair. Holding for patient to return her portion, mailed to patient 02/26/2020.

## 2020-03-05 ENCOUNTER — Ambulatory Visit: Payer: Self-pay | Admitting: Specialist

## 2020-03-05 ENCOUNTER — Other Ambulatory Visit: Payer: Self-pay

## 2020-03-05 ENCOUNTER — Ambulatory Visit: Payer: Self-pay | Admitting: Gerontology

## 2020-03-05 DIAGNOSIS — M25561 Pain in right knee: Secondary | ICD-10-CM

## 2020-03-05 NOTE — Progress Notes (Signed)
   Subjective:    Patient ID: [name], female    DOB: 1971-03-05, 49 y.o.   MRN: [id number]  HPI 49 year old who is super obese. We calculate her BMI is 59.8. She is complaining of R knee pain. X rays taken previously show tricompartmental OA. Medial compartment is essentially bone on bone. She's been taking Mobic.   She also has a Hx of diabetes and COPD.    Review of Systems     Objective:   Physical Exam She has a mild limp to the R side. She has full act EXT and flexes pass 90 degrees. She has some mild P/F crepitus.       Assessment & Plan:  She is not a candidate for TKA because of her weight. I am going to refer her to [facility] as we do not have the capability of even offering a steroid injection.

## 2020-03-07 ENCOUNTER — Encounter: Payer: Self-pay | Admitting: Emergency Medicine

## 2020-03-07 ENCOUNTER — Emergency Department: Payer: Self-pay

## 2020-03-07 ENCOUNTER — Emergency Department
Admission: EM | Admit: 2020-03-07 | Discharge: 2020-03-07 | Disposition: A | Discharge status: Home / Self Care | Payer: Self-pay | Attending: Student in an Organized Health Care Education/Training Program | Admitting: Student in an Organized Health Care Education/Training Program

## 2020-03-07 ENCOUNTER — Other Ambulatory Visit: Payer: Self-pay

## 2020-03-07 ENCOUNTER — Ambulatory Visit: Payer: Self-pay | Admitting: Gerontology

## 2020-03-07 VITALS — BP 132/83 | HR 67 | Ht 62.0 in | Wt 339.6 lb

## 2020-03-07 DIAGNOSIS — F17200 Nicotine dependence, unspecified, uncomplicated: Secondary | ICD-10-CM

## 2020-03-07 DIAGNOSIS — R600 Localized edema: Secondary | ICD-10-CM

## 2020-03-07 DIAGNOSIS — E119 Type 2 diabetes mellitus without complications: Secondary | ICD-10-CM

## 2020-03-07 DIAGNOSIS — Z7951 Long term (current) use of inhaled steroids: Secondary | ICD-10-CM | POA: Insufficient documentation

## 2020-03-07 DIAGNOSIS — Z20822 Contact with and (suspected) exposure to covid-19: Secondary | ICD-10-CM | POA: Insufficient documentation

## 2020-03-07 DIAGNOSIS — R0602 Shortness of breath: Secondary | ICD-10-CM

## 2020-03-07 DIAGNOSIS — Z7984 Long term (current) use of oral hypoglycemic drugs: Secondary | ICD-10-CM | POA: Insufficient documentation

## 2020-03-07 DIAGNOSIS — J449 Chronic obstructive pulmonary disease, unspecified: Secondary | ICD-10-CM | POA: Insufficient documentation

## 2020-03-07 DIAGNOSIS — J45909 Unspecified asthma, uncomplicated: Secondary | ICD-10-CM | POA: Insufficient documentation

## 2020-03-07 DIAGNOSIS — F17210 Nicotine dependence, cigarettes, uncomplicated: Secondary | ICD-10-CM | POA: Insufficient documentation

## 2020-03-07 DIAGNOSIS — IMO0001 Reserved for inherently not codable concepts without codable children: Secondary | ICD-10-CM

## 2020-03-07 DIAGNOSIS — R2241 Localized swelling, mass and lump, right lower limb: Secondary | ICD-10-CM | POA: Insufficient documentation

## 2020-03-07 LAB — COMPREHENSIVE METABOLIC PANEL
ALT: 13 U/L (ref 0–44)
AST: 16 U/L (ref 15–41)
Albumin: 4.1 g/dL (ref 3.5–5.0)
Alkaline Phosphatase: 62 U/L (ref 38–126)
Anion gap: 10 (ref 5–15)
BUN: 14 mg/dL (ref 6–20)
CO2: 27 mmol/L (ref 22–32)
Calcium: 8.7 mg/dL — ABNORMAL LOW (ref 8.9–10.3)
Chloride: 100 mmol/L (ref 98–111)
Creatinine, Ser: 1.01 mg/dL — ABNORMAL HIGH (ref 0.44–1.00)
GFR calc Af Amer: >60 mL/min (ref >60)
GFR calc non Af Amer: >60 mL/min (ref >60)
Glucose, Bld: 139 mg/dL — ABNORMAL HIGH (ref 70–99)
Potassium: 4 mmol/L (ref 3.5–5.1)
Sodium: 137 mmol/L (ref 135–145)
Total Bilirubin: 0.6 mg/dL (ref 0.3–1.2)
Total Protein: 7.7 g/dL (ref 6.5–8.1)

## 2020-03-07 LAB — CBC
HCT: 28.3 % — ABNORMAL LOW (ref 36.0–46.0)
Hemoglobin: 7.8 g/dL — ABNORMAL LOW (ref 12.0–15.0)
MCH: 20.6 pg — ABNORMAL LOW (ref 26.0–34.0)
MCHC: 27.6 g/dL — ABNORMAL LOW (ref 30.0–36.0)
MCV: 74.9 fL — ABNORMAL LOW (ref 80.0–100.0)
Platelets: 228 10*3/uL (ref 150–400)
RBC: 3.78 MIL/uL — ABNORMAL LOW (ref 3.87–5.11)
RDW: 20.5 % — ABNORMAL HIGH (ref 11.5–15.5)
WBC: 9.4 10*3/uL (ref 4.0–10.5)
nRBC: 0.2 % (ref 0.0–0.2)

## 2020-03-07 LAB — SARS CORONAVIRUS 2 BY RT PCR (HOSPITAL ORDER, PERFORMED IN ~~LOC~~ HOSPITAL LAB): SARS Coronavirus 2: NEGATIVE

## 2020-03-07 LAB — TROPONIN I (HIGH SENSITIVITY)
Troponin I (High Sensitivity): 11 ng/L (ref <18)
Troponin I (High Sensitivity): 12 ng/L (ref <18)

## 2020-03-07 LAB — FIBRIN DERIVATIVES D-DIMER (ARMC ONLY): Fibrin derivatives D-dimer (ARMC): 798.56 ng/mL (FEU) — ABNORMAL HIGH (ref 0.00–499.00)

## 2020-03-07 LAB — BRAIN NATRIURETIC PEPTIDE: B Natriuretic Peptide: 290.7 pg/mL — ABNORMAL HIGH (ref 0.0–100.0)

## 2020-03-07 MED ORDER — METHYLPREDNISOLONE SODIUM SUCC 125 MG IJ SOLR
125.0000 mg | Freq: Once | INTRAMUSCULAR | Status: AC
  Administered 2020-03-07: 125 mg via INTRAVENOUS
  Filled 2020-03-07: qty 2

## 2020-03-07 MED ORDER — IPRATROPIUM-ALBUTEROL 0.5-2.5 (3) MG/3ML IN SOLN
3.0000 mL | Freq: Once | RESPIRATORY_TRACT | Status: AC
  Administered 2020-03-07: 3 mL via RESPIRATORY_TRACT
  Filled 2020-03-07: qty 3

## 2020-03-07 MED ORDER — IOHEXOL 350 MG/ML SOLN
75.0000 mL | Freq: Once | INTRAVENOUS | Status: AC | PRN
  Administered 2020-03-07: 75 mL via INTRAVENOUS

## 2020-03-07 MED ORDER — PREDNISONE 10 MG (21) PO TBPK
ORAL_TABLET | ORAL | 0 refills | Status: DC
Start: 2020-03-07 — End: 2020-03-14

## 2020-03-07 MED ORDER — METFORMIN HCL 500 MG PO TABS: 500.0000 mg | ORAL_TABLET | Freq: Every day | ORAL | 2 refills | Status: DC

## 2020-03-07 NOTE — ED Provider Notes (Signed)
[facility] Emergency Department Provider Note    First MD Initiated Contact with Patient 03/07/20 1413     (approximate)  I have reviewed the triage vital signs and the nursing notes.   HISTORY  Chief Complaint Shortness of Breath and Leg Pain    HPI Nehemiah Montee is a 49 y.o. female history of asthma presents to the ER for evaluation of progressive worsening shortness of breath and wheeze.  Also concerned about some swelling to the right leg.  States she has had issues with this in the past.  Denies any chest pain.  Does have some tightness with ambulation.  Feels like her COPD asthma is acting up.  Is not been out of having any fevers or chills.  No productive cough.  No recent antibiotics or steroids.    Past Medical History:  Diagnosis Date  . Asthma    No family history on file. History reviewed. No pertinent surgical history. Patient Active Problem List   Diagnosis Date Noted  . Bilateral leg edema 03/07/2020  . Encounter to establish care 02/08/2020  . Right knee pain 02/08/2020  . History of COPD 02/08/2020  . Type 2 diabetes mellitus without complications (HCC) 02/08/2020  . Smoking 02/08/2020      Prior to Admission medications   Medication Sig Start Date End Date Taking? Authorizing Provider  albuterol (VENTOLIN HFA) 108 (90 Base) MCG/ACT inhaler Inhale 2 puffs into the lungs every 6 (six) hours as needed for wheezing or shortness of breath. 02/08/20   Iloabachie, Chioma E, NP  Fluticasone-Salmeterol (ADVAIR) 250-50 MCG/DOSE AEPB Inhale 1 puff into the lungs 2 (two) times daily. 02/08/20   Iloabachie, Chioma E, NP  meloxicam (MOBIC) 15 MG tablet Take 1 tablet (15 mg total) by mouth daily. 02/08/20   Iloabachie, Chioma E, NP  metFORMIN (GLUCOPHAGE) 500 MG tablet Take 1 tablet (500 mg total) by mouth daily with breakfast. 03/07/20   Iloabachie, Chioma E, NP  traMADol (ULTRAM) 50 MG tablet Take 1 tablet (50 mg total) by mouth every 6 (six)  hours as needed. Patient not taking: Reported on 02/08/2020 12/07/19 12/06/20  Joni Reining, PA-C    Allergies Patient has no known allergies.    Social History Social History   Tobacco Use  . Smoking status: Current Every Day Smoker    Packs/day: 1.00    Types: Cigarettes  . Smokeless tobacco: Never Used  Vaping Use  . Vaping Use: Never used  Substance Use Topics  . Alcohol use: Not Currently  . Drug use: Not Currently    Review of Systems Patient denies headaches, rhinorrhea, blurry vision, numbness, shortness of breath, chest pain, edema, cough, abdominal pain, nausea, vomiting, diarrhea, dysuria, fevers, rashes or hallucinations unless otherwise stated above in HPI. ____________________________________________   PHYSICAL EXAM:  VITAL SIGNS: Vitals:   03/07/20 1432 03/07/20 1518  BP:  (!) 159/93  Pulse: 60 60  Resp: 15 20  Temp:    SpO2: 95% 95%    Constitutional: Alert and oriented.  Eyes: Conjunctivae are normal.  Head: Atraumatic. Nose: No congestion/rhinnorhea. Mouth/Throat: Mucous membranes are moist.   Neck: No stridor. Painless ROM.  Cardiovascular: Normal rate, regular rhythm. Grossly normal heart sounds.  Good peripheral circulation. Respiratory: mild tachypnea with diffuse expiratory wheeze. Gastrointestinal: Soft and nontender. No distention. No abdominal bruits. No CVA tenderness. Genitourinary:  Musculoskeletal: No lower extremity tenderness nor edema.  No joint effusions. Neurologic:  Normal speech and language. No gross focal neurologic deficits are  appreciated. No facial droop Skin:  Skin is warm, dry and intact. No rash noted. Psychiatric: Mood and affect are normal. Speech and behavior are normal.  ____________________________________________   LABS (all labs ordered are listed, but only abnormal results are displayed)  Results for orders placed or performed during the hospital encounter of 03/07/20 (from the past 24 hour(s))  CBC      Status: Abnormal   Collection Time: 03/07/20 11:43 AM  Result Value Ref Range   WBC 9.4 4.0 - 10.5 K/uL   RBC 3.78 (L) 3.87 - 5.11 MIL/uL   Hemoglobin 7.8 (L) 12.0 - 15.0 g/dL   HCT 76.7 (L) 36 - 46 %   MCV 74.9 (L) 80.0 - 100.0 fL   MCH 20.6 (L) 26.0 - 34.0 pg   MCHC 27.6 (L) 30.0 - 36.0 g/dL   RDW 34.1 (H) 93.7 - 90.2 %   Platelets 228 150 - 400 K/uL   nRBC 0.2 0.0 - 0.2 %  Comprehensive metabolic panel     Status: Abnormal   Collection Time: 03/07/20 11:43 AM  Result Value Ref Range   Sodium 137 135 - 145 mmol/L   Potassium 4.0 3.5 - 5.1 mmol/L   Chloride 100 98 - 111 mmol/L   CO2 27 22 - 32 mmol/L   Glucose, Bld 139 (H) 70 - 99 mg/dL   BUN 14 6 - 20 mg/dL   Creatinine, Ser 4.09 (H) 0.44 - 1.00 mg/dL   Calcium 8.7 (L) 8.9 - 10.3 mg/dL   Total Protein 7.7 6.5 - 8.1 g/dL   Albumin 4.1 3.5 - 5.0 g/dL   AST 16 15 - 41 U/L   ALT 13 0 - 44 U/L   Alkaline Phosphatase 62 38 - 126 U/L   Total Bilirubin 0.6 0.3 - 1.2 mg/dL   GFR calc non Af Amer >60 >60 mL/min   GFR calc Af Amer >60 >60 mL/min   Anion gap 10 5 - 15  Troponin I (High Sensitivity)     Status: None   Collection Time: 03/07/20 11:43 AM  Result Value Ref Range   Troponin I (High Sensitivity) 11 <18 ng/L  Brain natriuretic peptide     Status: Abnormal   Collection Time: 03/07/20 11:43 AM  Result Value Ref Range   B Natriuretic Peptide 290.7 (H) 0.0 - 100.0 pg/mL  Fibrin derivatives D-Dimer ([facility] only)     Status: Abnormal   Collection Time: 03/07/20  2:36 PM  Result Value Ref Range   Fibrin derivatives D-dimer ([facility]) 798.56 (H) 0.00 - 499.00 ng/mL (FEU)  Troponin I (High Sensitivity)     Status: None   Collection Time: 03/07/20  2:36 PM  Result Value Ref Range   Troponin I (High Sensitivity) 12 <18 ng/L   ____________________________________________  EKG My review and personal interpretation at Time: 11:36   Indication: sob  Rate: 70  Rhythm: sinus Axis: normal Other: normal intervals, no  stemi ____________________________________________  RADIOLOGY  I personally reviewed all radiographic images ordered to evaluate for the above acute complaints and reviewed radiology reports and findings.  These findings were personally discussed with the patient.  Please see medical record for radiology report.  ____________________________________________   PROCEDURES  Procedure(s) performed:  Procedures    Critical Care performed: no ____________________________________________   INITIAL IMPRESSION / ASSESSMENT AND PLAN / ED COURSE  Pertinent labs & imaging results that were available during my care of the patient were reviewed by me and considered in my medical decision making (  see chart for details).   DDX: Asthma, copd, CHF, pna, ptx, malignancy, Pe, anemia   Solymar Grace is a 49 y.o. who presents to the ED with symptoms as described above.  Does have some wheezing on exam but also has some lower extremity swelling but right is greater than left.  No history of DVTs but she not on anticoagulation.  Her lung exam is consistent with COPD exacerbation or asthma will give nebulizer as well as steroids.  Will test for Covid.  D-dimer was in triage and is elevated and given the swelling with her consistent shortness of breath will order lower extremity duplex as well as CTA.  Patient be signed out to oncoming physician pending reassessment.     The patient was evaluated in Emergency Department today for the symptoms described in the history of present illness. He/she was evaluated in the context of the global COVID-19 pandemic, which necessitated consideration that the patient might be at risk for infection with the SARS-CoV-2 virus that causes COVID-19. Institutional protocols and algorithms that pertain to the evaluation of patients at risk for COVID-19 are in a state of rapid change based on information released by regulatory bodies including the CDC and federal and state  organizations. These policies and algorithms were followed during the patient's care in the ED.  As part of my medical decision making, I reviewed the following data within the electronic MEDICAL RECORD NUMBER Nursing notes reviewed and incorporated, Labs reviewed, notes from prior ED visits and [place] Controlled Substance Database   ____________________________________________   FINAL CLINICAL IMPRESSION(S) / ED DIAGNOSES  Final diagnoses:  SOB (shortness of breath)  Edema of right lower leg      NEW MEDICATIONS STARTED DURING THIS VISIT:  New Prescriptions   No medications on file     Note:  This document was prepared using Dragon voice recognition software and may include unintentional dictation errors.    Willy Eddy, MD 03/07/20 1539

## 2020-03-07 NOTE — Discharge Instructions (Addendum)
As we discussed please talk to your doctor about obtaining further imaging of your spleen to better evaluate the abnormality seen today. Please seek medical attention for any high fevers, chest pain, shortness of breath, change in behavior, persistent vomiting, bloody stool or any other new or concerning symptoms.

## 2020-03-07 NOTE — Progress Notes (Signed)
Established Patient Office Visit  Subjective:  Patient ID: [name], female    DOB: [birth year]  Age: 49 y.o. MRN: [id number]  CC: No chief complaint on file.   HPI [name] presents for follow up of Type 2 diabetes mellitus, COPD . Her HgbA1c done on 02/08/2020 was 6.6% and she states that she's compliant with her medications and continues to make healthy lifestyle modifications. Her blood glucose was 113 mg/dl when checked during visit. Currently, she states that she's has been experiencing worsening shorthness of breath , that has been going on for 1 week. She reports that she's short of breath walking from her bedroom to the bathroom. She endorses wheezing, chest tightness and non productive cough. She states that she cut back to smoking 1/2 pack of cigarette daily and continues to working on quitting. She also c/o edema to bilateral lower extremity with right lower leg > than left lower leg. She admits to experiencing claudication to right lower leg, denies erythema. She's concerned about her shortness of breath and edema.   Past Medical History:  Diagnosis Date  . Asthma     No past surgical history on file.  No family history on file.  Social History   Socioeconomic History  . Marital status: Single    Spouse name: Not on file  . Number of children: Not on file  . Years of education: Not on file  . Highest education level: Not on file  Occupational History  . Not on file  Tobacco Use  . Smoking status: Current Every Day Smoker    Packs/day: 1.00    Types: Cigarettes  . Smokeless tobacco: Never Used  Vaping Use  . Vaping Use: Never used  Substance and Sexual Activity  . Alcohol use: Not Currently  . Drug use: Not Currently  . Sexual activity: Not on file  Other Topics Concern  . Not on file  Social History Narrative  . Not on file   Social Determinants of Health   Financial Resource Strain:   . Difficulty of Paying Living Expenses:   Food Insecurity:    . Worried About Programme researcher, broadcasting/film/video in the Last Year:   . Barista in the Last Year:   Transportation Needs:   . Freight forwarder (Medical):   Marland Kitchen Lack of Transportation (Non-Medical):   Physical Activity:   . Days of Exercise per Week:   . Minutes of Exercise per Session:   Stress:   . Feeling of Stress :   Social Connections:   . Frequency of Communication with Friends and Family:   . Frequency of Social Gatherings with Friends and Family:   . Attends Religious Services:   . Active Member of Clubs or Organizations:   . Attends Banker Meetings:   Marland Kitchen Marital Status:   Intimate Partner Violence:   . Fear of Current or Ex-Partner:   . Emotionally Abused:   Marland Kitchen Physically Abused:   . Sexually Abused:     Outpatient Medications Prior to Visit  Medication Sig Dispense Refill  . albuterol (VENTOLIN HFA) 108 (90 Base) MCG/ACT inhaler Inhale 2 puffs into the lungs every 6 (six) hours as needed for wheezing or shortness of breath. 18 g 3  . Fluticasone-Salmeterol (ADVAIR) 250-50 MCG/DOSE AEPB Inhale 1 puff into the lungs 2 (two) times daily. 60 each 3  . meloxicam (MOBIC) 15 MG tablet Take 1 tablet (15 mg total) by mouth daily. 30 tablet 2  .  traMADol (ULTRAM) 50 MG tablet Take 1 tablet (50 mg total) by mouth every 6 (six) hours as needed. (Patient not taking: Reported on 02/08/2020) 20 tablet 0  . metFORMIN (GLUCOPHAGE) 500 MG tablet Take 1 tablet (500 mg total) by mouth daily with breakfast. 30 tablet 0   No facility-administered medications prior to visit.    No Known Allergies  ROS Review of Systems  Constitutional: Negative.   Respiratory: Positive for cough, chest tightness, shortness of breath and wheezing.   Cardiovascular: Positive for leg swelling.  Neurological: Negative.   Psychiatric/Behavioral: Negative.       Objective:    Physical Exam HENT:     Head: Normocephalic and atraumatic.  Cardiovascular:     Rate and Rhythm: Normal rate and  regular rhythm.     Pulses: Normal pulses.     Heart sounds: Normal heart sounds.  Pulmonary:     Breath sounds: Examination of the right-upper field reveals wheezing. Examination of the left-upper field reveals wheezing. Wheezing present.  Musculoskeletal:     Right lower leg: Edema (+3) present.     Left lower leg: Edema (+1) present.  Skin:    General: Skin is warm.  Neurological:     Mental Status: She is alert.     BP 132/83 (BP Location: Left Arm, Patient Position: Sitting, Cuff Size: Large)   Pulse 67   Ht 5\' 2"  (1.575 m)   Wt (!) 339 lb 9.6 oz (154 kg)   LMP 02/26/2020 (Approximate)   SpO2 93%   BMI 62.11 kg/m  Wt Readings from Last 3 Encounters:  03/07/20 (!) 315 lb (142.9 kg)  03/07/20 (!) 339 lb 9.6 oz (154 kg)  02/22/20 (!) 327 lb 6.4 oz (148.5 kg)     Health Maintenance Due  Topic Date Due  . Hepatitis C Screening  Never done  . PNEUMOCOCCAL POLYSACCHARIDE VACCINE AGE 67-64 HIGH RISK  Never done  . FOOT EXAM  Never done  . OPHTHALMOLOGY EXAM  Never done  . COVID-19 Vaccine (1) Never done  . HIV Screening  Never done  . TETANUS/TDAP  Never done  . PAP SMEAR-Modifier  Never done  . MAMMOGRAM  Never done  . COLONOSCOPY  Never done  . INFLUENZA VACCINE  02/25/2020    There are no preventive care reminders to display for this patient.  Lab Results  Component Value Date   TSH 5.230 (H) 02/21/2020   Lab Results  Component Value Date   WBC 9.4 03/07/2020   HGB 7.8 (L) 03/07/2020   HCT 28.3 (L) 03/07/2020   MCV 74.9 (L) 03/07/2020   PLT 228 03/07/2020   Lab Results  Component Value Date   NA 137 03/07/2020   K 4.0 03/07/2020   CO2 27 03/07/2020   GLUCOSE 139 (H) 03/07/2020   BUN 14 03/07/2020   CREATININE 1.01 (H) 03/07/2020   BILITOT 0.6 03/07/2020   ALKPHOS 62 03/07/2020   AST 16 03/07/2020   ALT 13 03/07/2020   PROT 7.7 03/07/2020   ALBUMIN 4.1 03/07/2020   CALCIUM 8.7 (L) 03/07/2020   ANIONGAP 10 03/07/2020   Lab Results  Component  Value Date   CHOL 137 02/21/2020   Lab Results  Component Value Date   HDL 32 (L) 02/21/2020   Lab Results  Component Value Date   LDLCALC 73 02/21/2020   Lab Results  Component Value Date   TRIG 186 (H) 02/21/2020   Lab Results  Component Value Date   CHOLHDL 4.3  02/21/2020   Lab Results  Component Value Date   HGBA1C 6.6 (A) 02/08/2020      Assessment & Plan:   1. Type 2 diabetes mellitus without complication, without long-term current use of insulin (HCC) - She will continue on current treatment regimen, advised to continue on low carb/non concentrated sweet diet. - metFORMIN (GLUCOPHAGE) 500 MG tablet; Take 1 tablet (500 mg total) by mouth daily with breakfast.  Dispense: 30 tablet; Refill: 2   2. Bilateral leg edema - Ddx DVT, unable to get imaging, she was advised to go to the Emergency Room.  3. Smoking - She was strongly encouraged on smoking cessation, was provided with [place] Quit line information.  4. SOB (shortness of breath) - Ddx PE, COPD exacerbation,unable to get imaging, she was advised to go to the Emergency room.     Follow-up: Return in about 1 week (around 03/14/2020), or if symptoms worsen or fail to improve.    Biddie Sebek Trellis Paganini, NP

## 2020-03-07 NOTE — ED Notes (Signed)
[employer] tech in room.

## 2020-03-07 NOTE — ED Notes (Signed)
Pt with o2 sat decreasing while sleeping. Pt placed on O2 2 Lnc. Pt tolerating well.

## 2020-03-07 NOTE — ED Provider Notes (Signed)
[place] negative for DVT. CT without evidence of PE. 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 Discussed this with the patient. Discussed importance of follow up with PCP for further management and work up.   Phineas Semen, MD 03/07/20 [phone]

## 2020-03-07 NOTE — ED Triage Notes (Signed)
Pt here with c/o bilateral leg pain and swelling over the past few days, R>L. Denies hx of CHF. States her MD thinks possible blood clot. Pt states some increasing shob and wheezing the past few days as well, chest tightness with shob at times with exertion. Pt in no distress in triage.

## 2020-03-08 ENCOUNTER — Telehealth: Payer: Self-pay

## 2020-03-08 NOTE — Telephone Encounter (Signed)
Contacted patient to schedule her colonoscopy.  Her sister answered the call and stated that pt is not available.  I asked her to please have her sister call to the office to reschedule this call.  Chart notes that patient was in ER yesterday for SOB.  Thanks,  [place], [place]

## 2020-03-14 ENCOUNTER — Other Ambulatory Visit: Payer: Self-pay

## 2020-03-14 ENCOUNTER — Ambulatory Visit: Payer: Self-pay | Admitting: Gerontology

## 2020-03-14 VITALS — BP 134/81 | HR 65 | Temp 97.6°F | Resp 18 | Ht 62.0 in | Wt 335.5 lb

## 2020-03-14 DIAGNOSIS — R7989 Other specified abnormal findings of blood chemistry: Secondary | ICD-10-CM

## 2020-03-14 DIAGNOSIS — R600 Localized edema: Secondary | ICD-10-CM

## 2020-03-14 DIAGNOSIS — R9389 Abnormal findings on diagnostic imaging of other specified body structures: Secondary | ICD-10-CM

## 2020-03-14 DIAGNOSIS — R0602 Shortness of breath: Secondary | ICD-10-CM

## 2020-03-14 NOTE — Progress Notes (Signed)
Established Patient Office Visit  Subjective:  Patient ID: [name], female    DOB: 26-Jun-1971  Age: 49 y.o. MRN: [id number]  CC: No chief complaint on file.   HPI [name] is a 49 y.o. female who presents for follow up of shortness of breath and bilateral lower extremity edema. She reports being compliant with her medication and treatment regimen. She was seen at the ED on 03/07/2020 for shortness of breath and was treated with prednisone and nebulizer. During her ED visit, chest CT was done and showed   presence of cardiomegaly and no acute intrathoracic findings or pulmonary embolism seen. No pneumonia or pulmonary edema was also noted. Currently, she continues to experience intermittent shortness of breath and wheezing. Though, she denies chest pain, she endorsed chest tightness and verbalized using her Albuterol inhaler every 4-6 hours as needed with moderate relief. She reports cutting back on her smoking and states that she currently smokes 1/2 a pack of cigarette per day and admits the desire to quit.   During her ED visit, her D-dimer which was noted to be 798.56 ng/mL, doppler US done to RLE showed no evidence of deep venous thrombosis in the right lower extremity. Left common femoral vein also patent per Dr. Bretta Bang. Currently, she states that the edema to her right lower extremity is greater than the left, and she denies claudication or erythema to the RLE. Her lab done 03/07/2020 showed a BNP of 290.7pd/mL, and Cardiomegaly was noted on CT scan. She denies chest pain and palpitations. In addition, her CT scan also showed Atypical configuration of the spleen, suggesting a possible exophytic solid mass of approximately 6-7 cm greatest dimension. Per consensus guidelines, recommend further characterization with nonemergent MRI of the spleen or PET-CT, per Dr. Bary Richard. She denies abnormal pain, fever, or chills. Overall, she states that she is doing well, despite her  persistent shortness of breath and offers no further complaint.   Past Medical History:  Diagnosis Date  . Asthma     No past surgical history on file.  No family history on file.  Social History   Socioeconomic History  . Marital status: Single    Spouse name: Not on file  . Number of children: Not on file  . Years of education: Not on file  . Highest education level: Not on file  Occupational History  . Not on file  Tobacco Use  . Smoking status: Current Every Day Smoker    Packs/day: 1.00    Types: Cigarettes  . Smokeless tobacco: Never Used  Vaping Use  . Vaping Use: Never used  Substance and Sexual Activity  . Alcohol use: Not Currently  . Drug use: Not Currently  . Sexual activity: Not on file  Other Topics Concern  . Not on file  Social History Narrative  . Not on file   Social Determinants of Health   Financial Resource Strain:   . Difficulty of Paying Living Expenses: Not on file  Food Insecurity:   . Worried About Programme researcher, broadcasting/film/video in the Last Year: Not on file  . Ran Out of Food in the Last Year: Not on file  Transportation Needs:   . Lack of Transportation (Medical): Not on file  . Lack of Transportation (Non-Medical): Not on file  Physical Activity:   . Days of Exercise per Week: Not on file  . Minutes of Exercise per Session: Not on file  Stress:   . Feeling of Stress :  Not on file  Social Connections:   . Frequency of Communication with Friends and Family: Not on file  . Frequency of Social Gatherings with Friends and Family: Not on file  . Attends Religious Services: Not on file  . Active Member of Clubs or Organizations: Not on file  . Attends Banker Meetings: Not on file  . Marital Status: Not on file  Intimate Partner Violence:   . Fear of Current or Ex-Partner: Not on file  . Emotionally Abused: Not on file  . Physically Abused: Not on file  . Sexually Abused: Not on file    Outpatient Medications Prior to Visit   Medication Sig Dispense Refill  . albuterol (VENTOLIN HFA) 108 (90 Base) MCG/ACT inhaler Inhale 2 puffs into the lungs every 6 (six) hours as needed for wheezing or shortness of breath. 18 g 3  . Fluticasone-Salmeterol (ADVAIR) 250-50 MCG/DOSE AEPB Inhale 1 puff into the lungs 2 (two) times daily. 60 each 3  . meloxicam (MOBIC) 15 MG tablet Take 1 tablet (15 mg total) by mouth daily. 30 tablet 2  . metFORMIN (GLUCOPHAGE) 500 MG tablet Take 1 tablet (500 mg total) by mouth daily with breakfast. 30 tablet 2  . traMADol (ULTRAM) 50 MG tablet Take 1 tablet (50 mg total) by mouth every 6 (six) hours as needed. (Patient not taking: Reported on 02/08/2020) 20 tablet 0  . predniSONE (STERAPRED UNI-PAK 21 TAB) 10 MG (21) TBPK tablet Per packaging instructions (Patient not taking: Reported on 03/14/2020) 21 tablet 0   No facility-administered medications prior to visit.    No Known Allergies  ROS Review of Systems  Constitutional: Negative.   Respiratory: Positive for chest tightness (.Hx of Asthma ), shortness of breath (.Hx of Asthma .) and wheezing (..Hx of Asthma ).   Cardiovascular: Positive for leg swelling. Palpitations: .Bilateral Lower extremity edema   Gastrointestinal: Negative.   Genitourinary: Negative.   Musculoskeletal: Negative.   Neurological: Negative.   Psychiatric/Behavioral: Negative.       Objective:    Physical Exam Constitutional:      Appearance: Normal appearance. She is obese.  HENT:     Head: Normocephalic and atraumatic.  Cardiovascular:     Rate and Rhythm: Normal rate and regular rhythm.     Pulses: Normal pulses.     Heart sounds: Normal heart sounds.  Pulmonary:     Breath sounds: Wheezing (.Bilateral anterior wheezing with expiration) present.  Musculoskeletal:     Right lower leg: Edema (.+2 Pitting ) present.     Left lower leg: Edema (.+1 pitting) present.  Neurological:     General: No focal deficit present.     Mental Status: She is alert and  oriented to person, place, and time.  Psychiatric:        Mood and Affect: Mood normal.        Behavior: Behavior normal.        Thought Content: Thought content normal.        Judgment: Judgment normal.     BP 134/81 (BP Location: Left Arm, Patient Position: Sitting)   Pulse 65   Temp 97.6 F (36.4 C)   Resp 18   Ht 5\' 2"  (1.575 m)   Wt (!) 335 lb 8 oz (152.2 kg)   LMP 02/26/2020 (Approximate)   SpO2 90%   BMI 61.36 kg/m  Wt Readings from Last 3 Encounters:  03/14/20 (!) 335 lb 8 oz (152.2 kg)  03/07/20 (!) 315 lb (142.9  kg)  03/07/20 (!) 339 lb 9.6 oz (154 kg)   She has lost a about 4 lbs in a month. She was advised and encouraged to continue her weight loss regimen.   Health Maintenance Due  Topic Date Due  . Hepatitis C Screening  Never done  . PNEUMOCOCCAL POLYSACCHARIDE VACCINE AGE 57-64 HIGH RISK  Never done  . FOOT EXAM  Never done  . OPHTHALMOLOGY EXAM  Never done  . COVID-19 Vaccine (1) Never done  . HIV Screening  Never done  . TETANUS/TDAP  Never done  . PAP SMEAR-Modifier  Never done  . MAMMOGRAM  Never done  . COLONOSCOPY  Never done  . INFLUENZA VACCINE  02/25/2020    There are no preventive care reminders to display for this patient.  Lab Results  Component Value Date   TSH 5.230 (H) 02/21/2020   Lab Results  Component Value Date   WBC 9.4 03/07/2020   HGB 7.8 (L) 03/07/2020   HCT 28.3 (L) 03/07/2020   MCV 74.9 (L) 03/07/2020   PLT 228 03/07/2020   Lab Results  Component Value Date   NA 137 03/07/2020   K 4.0 03/07/2020   CO2 27 03/07/2020   GLUCOSE 139 (H) 03/07/2020   BUN 14 03/07/2020   CREATININE 1.01 (H) 03/07/2020   BILITOT 0.6 03/07/2020   ALKPHOS 62 03/07/2020   AST 16 03/07/2020   ALT 13 03/07/2020   PROT 7.7 03/07/2020   ALBUMIN 4.1 03/07/2020   CALCIUM 8.7 (L) 03/07/2020   ANIONGAP 10 03/07/2020   Lab Results  Component Value Date   CHOL 137 02/21/2020   Lab Results  Component Value Date   HDL 32 (L) 02/21/2020    Lab Results  Component Value Date   LDLCALC 73 02/21/2020   Lab Results  Component Value Date   TRIG 186 (H) 02/21/2020   Lab Results  Component Value Date   CHOLHDL 4.3 02/21/2020   Lab Results  Component Value Date   HGBA1C 6.6 (A) 02/08/2020      Assessment & Plan:   1. SOB (shortness of breath) She continues to experience increased shortness of breath and would be referred to pulmonology   - Ambulatory referral to Pulmonology  2. Bilateral leg edema RLE edema > LLE edema - Ambulatory referral to Cardiology - Ambulatory referral to Vascular Surgery   3. Abnormal CT scan   - Ambulatory referral to Gastroenterology to evaluate exophytic solid mass noted on her spleen.  4. Elevated brain natriuretic peptide (BNP) level  - Ambulatory referral to Cardiology for an abnormally elevated BNP level.    Follow-up: Return in about 1 month (around 04/17/2020), or if symptoms worsen or fail to improve.    Onnie Graham, RN

## 2020-03-18 ENCOUNTER — Telehealth: Payer: Self-pay | Admitting: Pharmacist

## 2020-03-18 NOTE — Telephone Encounter (Signed)
03/18/2020 12:20:12 PM - ProAir faxed to [employer]  -- [name] - Monday, March 18, 2020 12:19 PM --Faxed [employer] application for ProAir HFA 0.09mg /2.5gm Inhale 2 puffs into the lungs every 6 hours as needed for wheezing or shortness of breath with copies of check stubs and 1040.   03/18/2020 12:18:39 PM - Advair faxed to [employer]  -- [name] - Monday, March 18, 2020 12:17 PM --Faxed [employer] application for enrollment on Advair 250/50 Inhale 1 puff into the lungs 2 times a day with copies of check stubs.

## 2020-04-08 ENCOUNTER — Other Ambulatory Visit: Payer: Self-pay

## 2020-04-08 ENCOUNTER — Ambulatory Visit (INDEPENDENT_AMBULATORY_CARE_PROVIDER_SITE_OTHER): Payer: Self-pay | Admitting: Vascular Surgery

## 2020-04-08 ENCOUNTER — Encounter (INDEPENDENT_AMBULATORY_CARE_PROVIDER_SITE_OTHER): Payer: Self-pay | Admitting: Vascular Surgery

## 2020-04-08 VITALS — BP 136/77 | HR 78 | Ht 61.0 in | Wt 331.0 lb

## 2020-04-08 DIAGNOSIS — J45909 Unspecified asthma, uncomplicated: Secondary | ICD-10-CM

## 2020-04-08 DIAGNOSIS — I890 Lymphedema, not elsewhere classified: Secondary | ICD-10-CM | POA: Insufficient documentation

## 2020-04-08 DIAGNOSIS — E119 Type 2 diabetes mellitus without complications: Secondary | ICD-10-CM

## 2020-04-08 NOTE — Progress Notes (Signed)
MRN : [id number]  [name] is a 49 y.o. (1971/05/02) female who presents with chief complaint of  Chief Complaint  Patient presents with  . New Patient (Initial Visit)    LE edema  .  History of Present Illness:   Patient is seen for evaluation of leg pain and leg swelling. The patient first noticed the swelling remotely. The swelling is associated with pain and discoloration. The pain and swelling worsens with prolonged dependency and improves with elevation. The pain is unrelated to activity.  The patient notes that in the morning the legs are significantly improved but they steadily worsened throughout the course of the day. The patient also notes a steady worsening of the discoloration in the ankle and shin area.   The patient denies claudication symptoms.  The patient denies symptoms consistent with rest pain.  The patient denies and extensive history of DJD and LS spine disease.  The patient has no had any past angiography, interventions or vascular surgery.  Elevation makes the leg symptoms better, dependency makes them much worse. There is no history of ulcerations. The patient denies any recent changes in medications.  The patient has not been wearing graduated compression.  The patient denies a history of DVT or PE. There is no prior history of phlebitis. There is no history of primary lymphedema.  No history of malignancies. No history of trauma or groin or pelvic surgery. There is no history of radiation treatment to the groin or pelvis  The patient denies amaurosis fugax or recent TIA symptoms. There are no recent neurological changes noted. The patient denies recent episodes of angina or shortness of breath  Duplex ultrasound of the lower extremities is negative for DVT.  No outpatient medications have been marked as taking for the 04/08/20 encounter (Office Visit) with Gilda Crease, Latina Craver, MD.    Past Medical History:  Diagnosis Date  . Asthma     No past  surgical history on file.  Social History Social History   Tobacco Use  . Smoking status: Current Every Day Smoker    Packs/day: 1.00    Types: Cigarettes  . Smokeless tobacco: Never Used  Vaping Use  . Vaping Use: Never used  Substance Use Topics  . Alcohol use: Not Currently  . Drug use: Not Currently    Family History No family history of bleeding/clotting disorders, porphyria or autoimmune disease   No Known Allergies   REVIEW OF SYSTEMS (Negative unless checked)  Constitutional: [] Weight loss  [] Fever  [] Chills Cardiac: [] Chest pain   [] Chest pressure   [] Palpitations   [] Shortness of breath when laying flat   [] Shortness of breath with exertion. Vascular:  [] Pain in legs with walking   [x] Pain in legs at rest  [] History of DVT   [] Phlebitis   [x] Swelling in legs   [] Varicose veins   [] Non-healing ulcers Pulmonary:   [] Uses home oxygen   [] Productive cough   [] Hemoptysis   [] Wheeze  [] COPD   [] Asthma Neurologic:  [] Dizziness   [] Seizures   [] History of stroke   [] History of TIA  [] Aphasia   [] Vissual changes   [] Weakness or numbness in arm   [] Weakness or numbness in leg Musculoskeletal:   [] Joint swelling   [] Joint pain   [] Low back pain Hematologic:  [] Easy bruising  [] Easy bleeding   [] Hypercoagulable state   [] Anemic Gastrointestinal:  [] Diarrhea   [] Vomiting  [] Gastroesophageal reflux/heartburn   [] Difficulty swallowing. Genitourinary:  [] Chronic kidney disease   [] Difficult urination  []   Frequent urination   [] Blood in urine Skin:  [] Rashes   [] Ulcers  Psychological:  [] History of anxiety   []  History of major depression.  Physical Examination  Vitals:   04/08/20 1336  BP: 136/77  Pulse: 78  Weight: (!) 331 lb (150.1 kg)  Height: 5\' 1"  (1.549 m)   Body mass index is 62.54 kg/m. Gen: WD/WN, NAD Head: [place]/AT, No temporalis wasting.  Ear/Nose/Throat: Hearing grossly intact, nares w/o erythema or drainage Eyes: PER, EOMI, sclera nonicteric.  Neck: Supple, no  large masses.   Pulmonary:  Good air movement, no audible wheezing bilaterally, no use of accessory muscles.  Cardiac: RRR, no JVD Vascular: scattered varicosities present bilaterally.  Moderate venous stasis changes to the legs bilaterally.  4+ soft pitting edema Vessel Right Left  Radial Palpable Palpable  PT Palpable Palpable  DP Palpable Palpable  Gastrointestinal: Non-distended. No guarding/no peritoneal signs.  Musculoskeletal: M/S 5/5 throughout.  No deformity or atrophy.  Neurologic: CN 2-12 intact. Symmetrical.  Speech is fluent. Motor exam as listed above. Psychiatric: Judgment intact, Mood & affect appropriate for pt's clinical situation. Dermatologic: venous rashes no ulcers noted.  No changes consistent with cellulitis.  CBC Lab Results  Component Value Date   WBC 9.4 03/07/2020   HGB 7.8 (L) 03/07/2020   HCT 28.3 (L) 03/07/2020   MCV 74.9 (L) 03/07/2020   PLT 228 03/07/2020    BMET    Component Value Date/Time   NA 137 03/07/2020 1143   NA 137 02/21/2020 0958   K 4.0 03/07/2020 1143   CL 100 03/07/2020 1143   CO2 27 03/07/2020 1143   GLUCOSE 139 (H) 03/07/2020 1143   BUN 14 03/07/2020 1143   BUN 11 02/21/2020 0958   CREATININE 1.01 (H) 03/07/2020 1143   CALCIUM 8.7 (L) 03/07/2020 1143   GFRNONAA >60 03/07/2020 1143   GFRAA >60 03/07/2020 1143   CrCl cannot be calculated (Patient's most recent lab result is older than the maximum 21 days allowed.).  COAG No results found for: INR, PROTIME  Radiology No results found.   Assessment/Plan 1. Lymphedema I have had a long discussion with the patient regarding swelling and why it  causes symptoms.  Patient will begin wearing graduated compression stockings class 1 (20-30 mmHg) on a daily basis a prescription was given. The patient will  beginning wearing the stockings first thing in the morning and removing them in the evening. The patient is instructed specifically not to sleep in the stockings.   In  addition, behavioral modification will be initiated.  This will include frequent elevation, use of over the counter pain medications and exercise such as walking.  I have reviewed systemic causes for chronic edema such as liver, kidney and cardiac etiologies.  The patient denies problems with these organ systems.    Consideration for a lymph pump will also be made based upon the effectiveness of conservative therapy.  This would help to improve the edema control and prevent sequela such as ulcers and infections   Patient should undergo duplex ultrasound of the venous system to ensure that DVT or reflux is not present.  The patient will follow-up with me after the ultrasound.    2. Asthma, unspecified asthma severity, unspecified whether complicated, unspecified whether persistent Continue pulmonary medications and aerosols as already ordered, these medications have been reviewed and there are no changes at this time.   3. Type 2 diabetes mellitus without complication, without long-term current use of insulin (HCC) Continue hypoglycemic medications  as already ordered, these medications have been reviewed and there are no changes at this time.  Hgb A1C to be monitored as already arranged by primary service     Levora Dredge, MD  04/08/2020 1:40 PM

## 2020-04-14 ENCOUNTER — Encounter (INDEPENDENT_AMBULATORY_CARE_PROVIDER_SITE_OTHER): Payer: Self-pay | Admitting: Vascular Surgery

## 2020-04-17 ENCOUNTER — Other Ambulatory Visit: Payer: Self-pay

## 2020-04-17 ENCOUNTER — Ambulatory Visit: Payer: Self-pay | Admitting: Gerontology

## 2020-04-17 ENCOUNTER — Encounter: Payer: Self-pay | Admitting: Gerontology

## 2020-04-17 VITALS — BP 131/74 | HR 69 | Ht 62.0 in | Wt 319.0 lb

## 2020-04-17 DIAGNOSIS — R0602 Shortness of breath: Secondary | ICD-10-CM

## 2020-04-17 DIAGNOSIS — E119 Type 2 diabetes mellitus without complications: Secondary | ICD-10-CM

## 2020-04-17 DIAGNOSIS — R600 Localized edema: Secondary | ICD-10-CM

## 2020-04-17 DIAGNOSIS — M79671 Pain in right foot: Secondary | ICD-10-CM

## 2020-04-17 DIAGNOSIS — Z0000 Encounter for general adult medical examination without abnormal findings: Secondary | ICD-10-CM

## 2020-04-17 DIAGNOSIS — M79672 Pain in left foot: Secondary | ICD-10-CM | POA: Insufficient documentation

## 2020-04-17 MED ORDER — GABAPENTIN 100 MG PO CAPS: 100.0000 mg | ORAL_CAPSULE | Freq: Every day | ORAL | 0 refills | Status: DC

## 2020-04-17 NOTE — Patient Instructions (Signed)
Call Cardiologist for appointment [phone].

## 2020-04-17 NOTE — Progress Notes (Signed)
Established Patient Office Visit  Subjective:  Patient ID: [name], female    DOB: Nov 18, 1970  Age: 49 y.o. MRN: [id number]  CC:  Chief Complaint  Patient presents with  . Diabetes  . Tingling    R foot worse than L; middle of foot throbs  . Leg Pain    HPI [name] presents for follow up of her lower extremity edema, shortness of breath, and bilateral foot pain . She was seen by [facility] on 04/08/20 to evaluate her lower extremity edema. Patient was prescribed to start wearing graduated compression stockings class 1 (20-30 mmHg) on a daily basis, wearing the stockings first thing in the morning and removing them in the evening. She reports that she has ordered the stockings and is waiting on delivery, but has not yet received them. Also educated on  frequent elevation, use of over the counter pain medications and exercise such as walking. Her last A1c was 02/08/20 and was noted to be 6.6% and she reports compliance with her daily metformin. She is noted to have a BMI of 58.35. She has lost 12lb since last recorded on 04/08/20. States that she has been "watching what she eats". Also reports increasing daily activity, but struggles with this d/t her chronic shortness of breath. She reports that her chronic shortness of breath is stable and has not worsened. She denies wheezing or chest tightness at home. She reports compliance with her inhalers and pulmonology follow up is scheduled for 05/10/20. She also reports bilateral foot pain worsening over the past month. She states that the pain is in the soles of her feet and that the right hurts more than the left. She states that the pain is a constant throbbing with intermittent "bee stinging"-like pain. States that nothing makes the pain better or worse. Reports that her meloxicam does not help treat the pain. Rates the pain 7/10. Patient states that she is doing well overall and offers no further complaint.   Past Medical  History:  Diagnosis Date  . Asthma     History reviewed. No pertinent surgical history.  History reviewed. No pertinent family history.  Social History   Socioeconomic History  . Marital status: Single    Spouse name: Not on file  . Number of children: Not on file  . Years of education: Not on file  . Highest education level: Not on file  Occupational History  . Not on file  Tobacco Use  . Smoking status: Current Some Day Smoker    Packs/day: 1.00    Types: Cigarettes  . Smokeless tobacco: Never Used  . Tobacco comment: cutting back  Vaping Use  . Vaping Use: Never used  Substance and Sexual Activity  . Alcohol use: Not Currently  . Drug use: Not Currently  . Sexual activity: Not on file  Other Topics Concern  . Not on file  Social History Narrative  . Not on file   Social Determinants of Health   Financial Resource Strain:   . Difficulty of Paying Living Expenses: Not on file  Food Insecurity:   . Worried About Programme researcher, broadcasting/film/video in the Last Year: Not on file  . Ran Out of Food in the Last Year: Not on file  Transportation Needs:   . Lack of Transportation (Medical): Not on file  . Lack of Transportation (Non-Medical): Not on file  Physical Activity:   . Days of Exercise per Week: Not on file  . Minutes of  Exercise per Session: Not on file  Stress:   . Feeling of Stress : Not on file  Social Connections:   . Frequency of Communication with Friends and Family: Not on file  . Frequency of Social Gatherings with Friends and Family: Not on file  . Attends Religious Services: Not on file  . Active Member of Clubs or Organizations: Not on file  . Attends Banker Meetings: Not on file  . Marital Status: Not on file  Intimate Partner Violence:   . Fear of Current or Ex-Partner: Not on file  . Emotionally Abused: Not on file  . Physically Abused: Not on file  . Sexually Abused: Not on file    Outpatient Medications Prior to Visit  Medication Sig  Dispense Refill  . albuterol (VENTOLIN HFA) 108 (90 Base) MCG/ACT inhaler Inhale 2 puffs into the lungs every 6 (six) hours as needed for wheezing or shortness of breath. 18 g 3  . Fluticasone-Salmeterol (ADVAIR) 250-50 MCG/DOSE AEPB Inhale 1 puff into the lungs 2 (two) times daily. 60 each 3  . meloxicam (MOBIC) 15 MG tablet Take 1 tablet (15 mg total) by mouth daily. 30 tablet 2  . metFORMIN (GLUCOPHAGE) 500 MG tablet Take 1 tablet (500 mg total) by mouth daily with breakfast. 30 tablet 2  . traMADol (ULTRAM) 50 MG tablet Take 1 tablet (50 mg total) by mouth every 6 (six) hours as needed. (Patient not taking: Reported on 02/08/2020) 20 tablet 0   No facility-administered medications prior to visit.    No Known Allergies  ROS Review of Systems  Respiratory: Positive for shortness of breath. Negative for cough, chest tightness and wheezing.        Hx of asthma, Reports that she feels at her baseline with this chronic symptom.  Cardiovascular: Positive for leg swelling. Negative for chest pain.       R leg is more swollen than L.   Endocrine: Negative.   Musculoskeletal: Positive for myalgias.       Pain on the posterior surface of both feet  Skin: Positive for color change.       Erythema reported to R lower extremity      Objective:    Physical Exam Constitutional:      Appearance: Normal appearance. She is obese.  Cardiovascular:     Rate and Rhythm: Normal rate and regular rhythm.     Heart sounds: Normal heart sounds.  Pulmonary:     Effort: Pulmonary effort is normal.     Breath sounds: Normal breath sounds.  Musculoskeletal:        General: Swelling present. Normal range of motion.     Right lower leg: Edema present.     Comments: R lower extremity 1+ pitting edema, L lower extremity 0  Skin:    General: Skin is warm and dry.     Capillary Refill: Capillary refill takes less than 2 seconds.     Findings: Erythema present.     Comments: Blanchable erythema to R lower  extremity  Neurological:     General: No focal deficit present.     Mental Status: She is alert and oriented to person, place, and time.     BP 131/74 (BP Location: Right Arm, Patient Position: Sitting)   Pulse 69   Ht 5\' 2"  (1.575 m)   Wt (!) 319 lb (144.7 kg)   SpO2 95%   BMI 58.35 kg/m  Wt Readings from Last 3 Encounters:  04/17/20 04/19/20)  319 lb (144.7 kg)  04/08/20 (!) 331 lb (150.1 kg)  03/14/20 (!) 335 lb 8 oz (152.2 kg)     Health Maintenance Due  Topic Date Due  . Hepatitis C Screening  Never done  . PNEUMOCOCCAL POLYSACCHARIDE VACCINE AGE 79-64 HIGH RISK  Never done  . FOOT EXAM  Never done  . OPHTHALMOLOGY EXAM  Never done  . COVID-19 Vaccine (1) Never done  . HIV Screening  Never done  . TETANUS/TDAP  Never done  . PAP SMEAR-Modifier  Never done  . MAMMOGRAM  Never done  . COLONOSCOPY  Never done  . INFLUENZA VACCINE  02/25/2020    There are no preventive care reminders to display for this patient.  Lab Results  Component Value Date   TSH 5.230 (H) 02/21/2020   Lab Results  Component Value Date   WBC 9.4 03/07/2020   HGB 7.8 (L) 03/07/2020   HCT 28.3 (L) 03/07/2020   MCV 74.9 (L) 03/07/2020   PLT 228 03/07/2020   Lab Results  Component Value Date   NA 137 03/07/2020   K 4.0 03/07/2020   CO2 27 03/07/2020   GLUCOSE 139 (H) 03/07/2020   BUN 14 03/07/2020   CREATININE 1.01 (H) 03/07/2020   BILITOT 0.6 03/07/2020   ALKPHOS 62 03/07/2020   AST 16 03/07/2020   ALT 13 03/07/2020   PROT 7.7 03/07/2020   ALBUMIN 4.1 03/07/2020   CALCIUM 8.7 (L) 03/07/2020   ANIONGAP 10 03/07/2020   Lab Results  Component Value Date   CHOL 137 02/21/2020   Lab Results  Component Value Date   HDL 32 (L) 02/21/2020   Lab Results  Component Value Date   LDLCALC 73 02/21/2020   Lab Results  Component Value Date   TRIG 186 (H) 02/21/2020   Lab Results  Component Value Date   CHOLHDL 4.3 02/21/2020   Lab Results  Component Value Date   HGBA1C 6.6 (A)  02/08/2020      Assessment & Plan:   1. Type 2 diabetes mellitus without complication, without long-term current use of insulin (HCC) -Continue metformin 500mg  daily with breakfast. Follow low carb/low concentrated sugar diet.  - HgB A1c; Future  2. Bilateral leg edema -Follow instructions from Vascular.  -Start wearing graduated compression stockings class 1 (20-30 mmHg) on a daily basis, wearing the stockings first thing in the morning and removing them in the evening.  -Frequently elevate your legs  -Do exercise, such as walking, as tolerated.  3. Morbid (severe) obesity due to excess calories (HCC) -Exercise regularly as tolerated. -Modify diet by counting calories. -Eat a low saturated fat, low carb diet.  4. Foot pain, bilateral -Start gabapentin as prescribed (100mg  qHS). -Frequently elevate your feet   - gabapentin (NEURONTIN) 100 MG capsule; Take 1 capsule (100 mg total) by mouth at bedtime.  Dispense: 30 capsule; Refill: 0  5. Healthcare maintenance - TSH; Future  6. SOB (shortness of breath) -She reports compliance with her Advair inhaler twice daily and her albuterol inhaler as needed. -Her SOB is stable, she has an appointment with pulmonology on 05/10/20.   Follow-up: Return in about 4 weeks (around 05/15/2020), or if symptoms worsen or fail to improve.    05/12/20, Student-NP

## 2020-05-10 ENCOUNTER — Other Ambulatory Visit: Payer: Self-pay

## 2020-05-10 ENCOUNTER — Ambulatory Visit (INDEPENDENT_AMBULATORY_CARE_PROVIDER_SITE_OTHER): Payer: Self-pay | Admitting: Internal Medicine

## 2020-05-10 ENCOUNTER — Encounter: Payer: Self-pay | Admitting: Internal Medicine

## 2020-05-10 VITALS — BP 136/90 | HR 62 | Temp 97.8°F | Ht 61.0 in | Wt 334.6 lb

## 2020-05-10 DIAGNOSIS — R0600 Dyspnea, unspecified: Secondary | ICD-10-CM

## 2020-05-10 DIAGNOSIS — Z87891 Personal history of nicotine dependence: Secondary | ICD-10-CM

## 2020-05-10 DIAGNOSIS — R0609 Other forms of dyspnea: Secondary | ICD-10-CM

## 2020-05-10 DIAGNOSIS — Z862 Personal history of diseases of the blood and blood-forming organs and certain disorders involving the immune mechanism: Secondary | ICD-10-CM

## 2020-05-10 DIAGNOSIS — R7989 Other specified abnormal findings of blood chemistry: Secondary | ICD-10-CM

## 2020-05-10 DIAGNOSIS — Z7185 Encounter for immunization safety counseling: Secondary | ICD-10-CM

## 2020-05-10 DIAGNOSIS — Z87898 Personal history of other specified conditions: Secondary | ICD-10-CM

## 2020-05-10 MED ORDER — SPIRIVA RESPIMAT 2.5 MCG/ACT IN AERS
2.0000 | INHALATION_SPRAY | Freq: Every day | RESPIRATORY_TRACT | 6 refills | Status: AC
  Filled 2020-10-30: qty 4, 30d supply, fill #0
  Filled 2020-11-29: qty 12, 90d supply, fill #0
  Filled 2021-02-28: qty 12, 90d supply, fill #1

## 2020-05-10 MED ORDER — SPIRIVA RESPIMAT 2.5 MCG/ACT IN AERS
2.0000 | INHALATION_SPRAY | Freq: Every day | RESPIRATORY_TRACT | 6 refills | Status: DC
Start: 2020-05-10 — End: 2020-05-10

## 2020-05-10 NOTE — Patient Instructions (Addendum)
ICD-10-CM   1. Dyspnea on exertion  R06.00   2. History of wheezing  Z87.898   3. Smoking history  Z87.891   4. History of anemia  Z86.2   5. Elevated brain natriuretic peptide (BNP) level  R79.89     Your shortness of breath is multifactorial which means for many reasons.  Your weight, physical deconditioning and anemia are definitely contributing to your shortness of breath.  In addition there might be undiagnosed heart problems.  From a pulmonary standpoint I do not see any evidence of pulmonary fibrosis or lung cancer or pneumonia to explain your shortness of breath.  You might or might not have fully controlled COPD/asthma  In addition untreated sleep apnea might also be contributing  Overall can be very difficult to improve your shortness of breath especially when it is multifactorial  Plan  -Check CBC with differential, IgE, chemistry, liver function test,  -Order 2D echo -Order full pulmonary function testing -Please work with your primary care physician/sleep doctor to ensure that you get your CPAP working again -Keep appointment with cardiology -Continue Advair 250/50 1 puff twice daily -Start empiric Spiriva Respimat sample 2.5 dose at 2 puff once daily -Continue albuterol -Refer to pulmonary rehabilitation - pls ge covid vaccie  Follow-up -8-10 weeks to review progress and results

## 2020-05-10 NOTE — Progress Notes (Signed)
OV 05/10/2020  Subjective:  Patient ID: [name], female , DOB: Mar 16, 1971 , age 49 y.o. , MRN: [id number] , ADDRESS: [address] [place] [address] PCP Rolm Gala, NP Patient Care Team: Rolm Gala, NP as PCP - General (Gerontology)  This Provider for this visit: Treatment Team:  Attending Provider: Kalman Shan, MD    05/10/2020 -   Chief Complaint  Patient presents with  . Consult    SOB on exertion, wheezing with activity, slight cough.     HPI [name] 49 y.o. -morbidly obese female who is a smoker referred by primary care physician Dr Edman Circle for evaluation of dyspnea.  Patient tells me that she is always morbidly obese and her weight fluctuates but overall stable.  She also tells me that she has chronic anemia since a young adult life.  She does not think it has been a change in this.  There is no history of any blood transfusions in the last year.  No hospitalizations in the last year.  She tells me 1 year ago she had insidious onset of shortness of breath.  Diagnosed with sleep apnea on the same time started on CPAP but 6 months ago the CPAP machine broke and she has not been using CPAP.  She says in the last 1 year shortness of breath has been insidiously getting worse.  Class III levels.  She was only able to walk 1 out of her 3 laps and stop because of shortness of breath.  It gets better with rest.  Associated with wheezing wheezing also present with exertion relieved by rest.  She had elevated BNP that I reviewed the primary care notes.  Has been referred to cardiology so far no echo done.  In August 2021 CT angiogram ruled out pulmonary embolism, pulmonary fibrosis and pneumonia.  No mention of emphysema.  She is a smoker.  She is frustrated by her symptom burden.  She tells me in the last 6 months she she is having to work less because of her shortness of breath.  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  She is having  to do less hours.  She had a walking desaturation test in the office.  185 feet x 3 laps.  But she only did 1 lap and stop because of dyspnea.  Her resting pulse ox was 100%.  Resting heart rate was 62/min.  Final pulse ox was 98% and final heart rate was 104/min.  She got tachycardic and shortness of breath and stop.  She finished the test prematurely.  IMPRESSION: CTAngio PE protocol Aug 2021 1. No acute intrathoracic findings. No pulmonary embolism seen. No pneumonia or pulmonary edema. 2. Cardiomegaly. 3. Atypical configuration of the spleen, suggesting a possible exophytic solid mass of approximately 6-7 cm greatest dimension. Per consensus guidelines, recommend further characterization with nonemergent MRI of the spleen or PET-CT.   Electronically Signed   By: Bary Richard M.D.   On: 03/07/2020 16:19  ROS - per HPI  Results for [name], [name] (MRN [id number]) as of 05/10/2020 10:06  Ref. Range 03/07/2020 11:43  Creatinine Latest Ref Range: 0.44 - 1.00 mg/dL 7.26 (H)   Results for [name], [name] (MRN [id number]) as of 05/10/2020 10:06  Ref. Range 03/07/2020 11:43  B Natriuretic Peptide Latest Ref Range: 0.0 - 100.0 pg/mL 290.7 (H)  Results for [name], [name] (MRN [id number]) as of 05/10/2020 10:06  Ref. Range 02/21/2020 09:58 03/07/2020 11:43  Hemoglobin Latest Ref Range: 12.0 - 15.0 g/dL 8.2 (L) 7.8 (L)    Results for [name], [name] (MRN [id number]) as of 05/10/2020 10:06  Ref. Range 02/21/2020 09:58 03/07/2020 11:43  Hemoglobin Latest Ref Range: 12.0 - 15.0 g/dL 8.2 (L) 7.8 (L)     has a past medical history of Asthma.   reports that she has been smoking cigarettes. She has been smoking about 3.00 packs per day. She has never used smokeless tobacco.  No past surgical history on file.  No Known Allergies  Immunization History  Administered Date(s) Administered  . Influenza,inj,Quad PF,6+ Mos 06/01/2017, 06/01/2018    No family history on file.   Current Outpatient  Medications:  .  albuterol (VENTOLIN HFA) 108 (90 Base) MCG/ACT inhaler, Inhale 2 puffs into the lungs every 6 (six) hours as needed for wheezing or shortness of breath., Disp: 18 g, Rfl: 3 .  Fluticasone-Salmeterol (ADVAIR) 250-50 MCG/DOSE AEPB, Inhale 1 puff into the lungs 2 (two) times daily., Disp: 60 each, Rfl: 3 .  gabapentin (NEURONTIN) 100 MG capsule, Take 1 capsule (100 mg total) by mouth at bedtime., Disp: 30 capsule, Rfl: 0 .  meloxicam (MOBIC) 15 MG tablet, Take 1 tablet (15 mg total) by mouth daily., Disp: 30 tablet, Rfl: 2 .  metFORMIN (GLUCOPHAGE) 500 MG tablet, Take 1 tablet (500 mg total) by mouth daily with breakfast., Disp: 30 tablet, Rfl: 2      Objective:   Vitals:   05/10/20 0956  BP: 136/90  Pulse: 62  Temp: 97.8 F (36.6 C)  TempSrc: Temporal  SpO2: 100%  Weight: (!) 334 lb 9.6 oz (151.8 kg)  Height: 5\' 1"  (1.549 m)    Estimated body mass index is 63.22 kg/m as calculated from the following:   Height as of this encounter: 5\' 1"  (1.549 m).   Weight as of this encounter: 334 lb 9.6 oz (151.8 kg).  @WEIGHTCHANGE @    05/10/20 0956  Weight: (!) 334 lb 9.6 oz (151.8 kg)     Physical Exam  General Appearance:    Alert, cooperative, no distress, appears stated age - older , Deconditioned looking - yes , OBESE  - yes, Sitting on Wheelchair -  yes  Head:    Normocephalic, without obvious abnormality, atraumatic  Eyes:    PERRL, conjunctiva/corneas clear,  Ears:    Normal TM's and external ear canals, both ears  Nose:   Nares normal, septum midline, mucosa normal, no drainage    or sinus tenderness. OXYGEN ON  - no . Patient is @ ra   Throat:   Lips, mucosa, and tongue normal; teeth and gums normal. Cyanosis on lips - no  Neck:   Supple, symmetrical, trachea midline, no adenopathy;    thyroid:  no enlargement/tenderness/nodules; no carotid   bruit or JVD  Back:     Symmetric, no curvature, ROM normal, no CVA tenderness  Lungs:     Distress -  no , Wheeze no, Barrell Chest - no, Purse lip breathing - no, Crackles - no   Chest Wall:    No tenderness or deformity.    Heart:    Regular rate and rhythm, S1 and S2 normal, no rub   or gallop, Murmur - no  Breast Exam:    NOT DONE  Abdomen:     Soft, non-tender, bowel sounds active all four quadrants,    no masses, no organomegaly. Visceral obesity - yes  Genitalia:   NOT DONE  Rectal:  NOT DONE  Extremities:   Extremities - normal, Has Cane - no, Clubbing - no, Edema - no  Pulses:   2+ and symmetric all extremities  Skin:   Stigmata of Connective Tissue Disease - no  Lymph nodes:   Cervical, supraclavicular, and axillary nodes normal  Psychiatric:  Neurologic:   Pleasant - yes, Anxious - no, Flat affect - no  CAm-ICU - neg, Alert and Oriented x 3 - yes, Moves all 4s - yes, Speech - normal, Cognition - intact         Assessment:       ICD-10-CM   1. Dyspnea on exertion  R06.00   2. History of wheezing  Z87.898   3. Smoking history  Z87.891   4. History of anemia  Z86.2   5. Elevated brain natriuretic peptide (BNP) level  R79.89   6. Vaccine counseling  Z71.85        Plan:     Patient Instructions     ICD-10-CM   1. Dyspnea on exertion  R06.00   2. History of wheezing  Z87.898   3. Smoking history  Z87.891   4. History of anemia  Z86.2   5. Elevated brain natriuretic peptide (BNP) level  R79.89     Your shortness of breath is multifactorial which means for many reasons.  Your weight, physical deconditioning and anemia are definitely contributing to your shortness of breath.  In addition there might be undiagnosed heart problems.  From a pulmonary standpoint I do not see any evidence of pulmonary fibrosis or lung cancer or pneumonia to explain your shortness of breath.  You might or might not have fully controlled COPD/asthma  In addition untreated sleep apnea might also be contributing  Overall can be very difficult to improve your shortness of breath especially  when it is multifactorial  Plan  -Check CBC with differential, IgE, chemistry, liver function test,  -Order 2D echo -Order full pulmonary function testing -Please work with your primary care physician/sleep doctor to ensure that you get your CPAP working again -Keep appointment with cardiology -Continue Advair 250/50 1 puff twice daily -Start empiric Spiriva Respimat sample 2.5 dose at 2 puff once daily -Continue albuterol -Refer to pulmonary rehabilitation - pls ge covid vaccie  Follow-up -8-10 weeks to review progress and results     SIGNATURE    Dr. Kalman Shan, M.D., F.C.C.P,  Pulmonary and Critical Care Medicine Staff Physician, [facility] Center Director - Interstitial Lung Disease  Program  Pulmonary Fibrosis [facility] at [facility] [place], [place], [address]  Pager: [phone], If no answer or between  15:00h - 7:00h: call [phone] Telephone: [phone]  10:32 AM 05/10/2020

## 2020-05-10 NOTE — Addendum Note (Signed)
Addended by: Lajoyce Lauber A on: 05/10/2020 05:10 PM   Modules accepted: Orders

## 2020-05-13 ENCOUNTER — Telehealth (INDEPENDENT_AMBULATORY_CARE_PROVIDER_SITE_OTHER): Payer: Self-pay

## 2020-05-13 NOTE — Telephone Encounter (Signed)
This was received by e-mail from Sherlyn Lick at [employer]:   HI Vernona Rieger [facility] all is well and you have a great weekend. I wanted to let you know that patient MRN [id number], who does not have any health insurance, could not afford a pump. So on Friday I set her up with a pump I've donated to her.  I just faxed her report a few minutes ago to you and Dr. Gilda Crease. Thank you for sending me this patient!   Kristie Cowman Sr. Media planner [employer]

## 2020-05-21 ENCOUNTER — Other Ambulatory Visit: Payer: Self-pay | Admitting: Gerontology

## 2020-05-21 DIAGNOSIS — M25561 Pain in right knee: Secondary | ICD-10-CM

## 2020-05-21 DIAGNOSIS — M79671 Pain in right foot: Secondary | ICD-10-CM

## 2020-05-21 DIAGNOSIS — E119 Type 2 diabetes mellitus without complications: Secondary | ICD-10-CM

## 2020-05-28 ENCOUNTER — Other Ambulatory Visit: Payer: Self-pay

## 2020-05-28 ENCOUNTER — Ambulatory Visit: Payer: Self-pay | Admitting: Gerontology

## 2020-05-28 VITALS — BP 127/70 | HR 63 | Resp 20 | Wt 336.8 lb

## 2020-05-28 DIAGNOSIS — G4733 Obstructive sleep apnea (adult) (pediatric): Secondary | ICD-10-CM

## 2020-05-28 DIAGNOSIS — R600 Localized edema: Secondary | ICD-10-CM

## 2020-05-28 DIAGNOSIS — M79671 Pain in right foot: Secondary | ICD-10-CM

## 2020-05-28 DIAGNOSIS — E119 Type 2 diabetes mellitus without complications: Secondary | ICD-10-CM

## 2020-05-28 DIAGNOSIS — M79672 Pain in left foot: Secondary | ICD-10-CM

## 2020-05-28 MED ORDER — GABAPENTIN 100 MG PO CAPS: 200.0000 mg | ORAL_CAPSULE | Freq: Every day | ORAL | 0 refills | Status: DC

## 2020-05-28 NOTE — Progress Notes (Signed)
Established Patient Office Visit  Subjective:  Patient ID: [name], female    DOB: 1971-06-26  Age: 49 y.o. MRN: [id number]  CC: No chief complaint on file.   HPI [name] presents for follow up after seeing pulmonology. Pulmonology reports that patient's weight, physical deconditioning, and anemia are contributing to her shortness of breath and did not find any evidence of pulmonary fibrosis, lung cancer, or pneumonia. Pulmonology also added Spiriva inhaler to patient's medication, which she is still waiting to receive. They also recommended that she follow up to get her CPAP working again. Following up on her bilateral foot pain addressed at last visit, she reports that the gabapentin that we started has decreased her pain, but she feels that it wears off. She has also started wearing the compression stockings prescribed by vascular and reports improvement in lower leg edema and pain. She reports compliance with her metformin and her blood glucose is 156 in clinic today. She denies any hypo/hyperglycemic episodes. She has gained 2lbs since last visit. She has cut down her smoking to 3-4 cigarettes daily. She declined the flu shot and COVID vaccinations today. She has several upcoming appointments (GI, Cardiology, and Pulmonology). Overall, she states that her health is improving and she offers no further complaints.   Past Medical History:  Diagnosis Date  . Asthma     No past surgical history on file.  No family history on file.  Social History   Socioeconomic History  . Marital status: Single    Spouse name: Not on file  . Number of children: Not on file  . Years of education: Not on file  . Highest education level: Not on file  Occupational History  . Not on file  Tobacco Use  . Smoking status: Current Every Day Smoker    Packs/day: 3.00    Types: Cigarettes  . Smokeless tobacco: Never Used  . Tobacco comment: 1/2 pack/day 05/10/20  Vaping Use  . Vaping Use:  Never used  Substance and Sexual Activity  . Alcohol use: Not Currently  . Drug use: Not Currently  . Sexual activity: Not on file  Other Topics Concern  . Not on file  Social History Narrative  . Not on file   Social Determinants of Health   Financial Resource Strain:   . Difficulty of Paying Living Expenses: Not on file  Food Insecurity:   . Worried About Programme researcher, broadcasting/film/video in the Last Year: Not on file  . Ran Out of Food in the Last Year: Not on file  Transportation Needs:   . Lack of Transportation (Medical): Not on file  . Lack of Transportation (Non-Medical): Not on file  Physical Activity:   . Days of Exercise per Week: Not on file  . Minutes of Exercise per Session: Not on file  Stress:   . Feeling of Stress : Not on file  Social Connections:   . Frequency of Communication with Friends and Family: Not on file  . Frequency of Social Gatherings with Friends and Family: Not on file  . Attends Religious Services: Not on file  . Active Member of Clubs or Organizations: Not on file  . Attends Banker Meetings: Not on file  . Marital Status: Not on file  Intimate Partner Violence:   . Fear of Current or Ex-Partner: Not on file  . Emotionally Abused: Not on file  . Physically Abused: Not on file  . Sexually Abused: Not on file  Outpatient Medications Prior to Visit  Medication Sig Dispense Refill  . albuterol (VENTOLIN HFA) 108 (90 Base) MCG/ACT inhaler Inhale 2 puffs into the lungs every 6 (six) hours as needed for wheezing or shortness of breath. 18 g 3  . Fluticasone-Salmeterol (ADVAIR) 250-50 MCG/DOSE AEPB Inhale 1 puff into the lungs 2 (two) times daily. 60 each 3  . metFORMIN (GLUCOPHAGE) 500 MG tablet TAKE ONE TABLET BY MOUTH EVERY DAY WITH BREAKFAST 30 tablet 3  . Tiotropium Bromide Monohydrate (SPIRIVA RESPIMAT) 2.5 MCG/ACT AERS Inhale 2 puffs into the lungs daily. 4 g 6  . gabapentin (NEURONTIN) 100 MG capsule TAKE ONE CAPSULE BY MOUTH AT BEDTIME  30 capsule 0  . meloxicam (MOBIC) 15 MG tablet TAKE ONE TABLET BY MOUTH EVERY DAY 30 tablet 0   No facility-administered medications prior to visit.    No Known Allergies  ROS Review of Systems  Constitutional: Negative.   Respiratory: Positive for chest tightness and shortness of breath.        Chest tightness with overexertion  Cardiovascular: Negative.   Gastrointestinal: Negative.   Endocrine: Negative.   Musculoskeletal: Negative.   Skin: Negative.   Neurological: Positive for numbness.       Pins and needles and numbness in bottom of feet      Objective:    Physical Exam Constitutional:      Appearance: Normal appearance. She is obese.  Cardiovascular:     Rate and Rhythm: Normal rate and regular rhythm.     Pulses: Normal pulses.     Heart sounds: Normal heart sounds.  Pulmonary:     Breath sounds: Normal breath sounds.  Abdominal:     General: Bowel sounds are normal.     Palpations: Abdomen is soft.  Musculoskeletal:        General: Normal range of motion.  Neurological:     General: No focal deficit present.     Mental Status: She is alert and oriented to person, place, and time.     BP 127/70 (BP Location: Left Arm, Patient Position: Sitting, Cuff Size: Large)   Pulse 63   Resp 20   Wt (!) 336 lb 12.8 oz (152.8 kg)   SpO2 95%   BMI 63.64 kg/m  Wt Readings from Last 3 Encounters:  05/28/20 (!) 336 lb 12.8 oz (152.8 kg)  05/10/20 (!) 334 lb 9.6 oz (151.8 kg)  04/17/20 (!) 319 lb (144.7 kg)     Health Maintenance Due  Topic Date Due  . Hepatitis C Screening  Never done  . PNEUMOCOCCAL POLYSACCHARIDE VACCINE AGE 79-64 HIGH RISK  Never done  . FOOT EXAM  Never done  . OPHTHALMOLOGY EXAM  Never done  . COVID-19 Vaccine (1) Never done  . HIV Screening  Never done  . TETANUS/TDAP  Never done  . PAP SMEAR-Modifier  Never done  . MAMMOGRAM  Never done  . COLONOSCOPY  Never done  . INFLUENZA VACCINE  02/25/2020    There are no preventive care  reminders to display for this patient.  Lab Results  Component Value Date   TSH 5.230 (H) 02/21/2020   Lab Results  Component Value Date   WBC 9.4 03/07/2020   HGB 7.8 (L) 03/07/2020   HCT 28.3 (L) 03/07/2020   MCV 74.9 (L) 03/07/2020   PLT 228 03/07/2020   Lab Results  Component Value Date   NA 137 03/07/2020   K 4.0 03/07/2020   CO2 27 03/07/2020   GLUCOSE 139 (H)  03/07/2020   BUN 14 03/07/2020   CREATININE 1.01 (H) 03/07/2020   BILITOT 0.6 03/07/2020   ALKPHOS 62 03/07/2020   AST 16 03/07/2020   ALT 13 03/07/2020   PROT 7.7 03/07/2020   ALBUMIN 4.1 03/07/2020   CALCIUM 8.7 (L) 03/07/2020   ANIONGAP 10 03/07/2020   Lab Results  Component Value Date   CHOL 137 02/21/2020   Lab Results  Component Value Date   HDL 32 (L) 02/21/2020   Lab Results  Component Value Date   LDLCALC 73 02/21/2020   Lab Results  Component Value Date   TRIG 186 (H) 02/21/2020   Lab Results  Component Value Date   CHOLHDL 4.3 02/21/2020   Lab Results  Component Value Date   HGBA1C 6.6 (A) 02/08/2020      Assessment & Plan:   1. Foot pain, bilateral Symptoms are improving. Increase gabapentin dose as prescribed. Continue to elevate your feet when possible. - gabapentin (NEURONTIN) 100 MG capsule; Take 2 capsules (200 mg total) by mouth at bedtime.  Dispense: 60 capsule; Refill: 0  2. Type 2 diabetes mellitus without complication, without long-term current use of insulin (HCC) Her last A1c was 6.6% on 02/08/20, her goal is <5.7%. Continue metformin as prescribed. Follow low carb/low concentrated sugar diet.  - HgB A1c; Future  3. Lower extremity edema Edema is improved with her graduated compression stockings, leg elevation, and increasing her walking.   4. OSA (obstructive sleep apnea) Provided [facility] application for sleep study and CPAP. - Ambulatory referral to Neurology  5. Morbid (severe) obesity due to excess calories (HCC) Her BMI is 63.64 kg/m^2. -Exercise  regularly as tolerated. -Modify diet by counting calories. -Eat a low saturated fat, low carb diet -Goal to lose 5lb by next visit (06/26/20)   Follow-up: Return in about 29 days (around 06/26/2020), or if symptoms worsen or fail to improve.    Kathlene November, Student-NP

## 2020-05-30 ENCOUNTER — Ambulatory Visit (INDEPENDENT_AMBULATORY_CARE_PROVIDER_SITE_OTHER): Payer: Self-pay | Admitting: Gastroenterology

## 2020-05-30 ENCOUNTER — Other Ambulatory Visit: Payer: Self-pay

## 2020-05-30 ENCOUNTER — Encounter: Payer: Self-pay | Admitting: Gastroenterology

## 2020-05-30 VITALS — BP 124/65 | HR 84 | Temp 97.8°F | Ht 61.0 in | Wt 336.0 lb

## 2020-05-30 DIAGNOSIS — D7389 Other diseases of spleen: Secondary | ICD-10-CM

## 2020-05-30 NOTE — Patient Instructions (Signed)
[facility] at 3:30 PM. Please remember to be fasting (nothing to east or drink) 4 hours before your MRI.

## 2020-05-30 NOTE — Progress Notes (Signed)
[name] [address]  [address]  [place], [place] [address]  Main: [phone]  Fax: [phone]   Gastroenterology Consultation  Referring Provider:     Rolm Gala, NP Primary Care Physician:  Rolm Gala, NP Reason for Consultation:     Abnormal CT        HPI:    Chief Complaint  Patient presents with  . Abnormal CT Scan    [name] is a 49 y.o. y/o female referred for consultation & management  by Dr. Linzie Collin, Francoise Ceo, NP.  Patient underwent CT scan in August 2021 due to shortness of breath which showed atypical configuration of the spleen, suggesting a possible exophytic solid mass. The patient denies abdominal or flank pain, anorexia, nausea or vomiting, dysphagia, change in bowel habits or black or bloody stools or weight loss.  Patient has never had a screening colonoscopy.  No family history of colon cancer.  Patient denies having any other colorectal cancer screening tests either  Past Medical History:  Diagnosis Date  . Asthma     No past surgical history on file.  Prior to Admission medications   Medication Sig Start Date End Date Taking? Authorizing Provider  albuterol (VENTOLIN HFA) 108 (90 Base) MCG/ACT inhaler Inhale 2 puffs into the lungs every 6 (six) hours as needed for wheezing or shortness of breath. 02/08/20  Yes Iloabachie, Chioma E, NP  Fluticasone-Salmeterol (ADVAIR) 250-50 MCG/DOSE AEPB Inhale 1 puff into the lungs 2 (two) times daily. 02/08/20  Yes Iloabachie, Chioma E, NP  gabapentin (NEURONTIN) 100 MG capsule Take 2 capsules (200 mg total) by mouth at bedtime. 05/28/20  Yes Iloabachie, Chioma E, NP  meloxicam (MOBIC) 7.5 MG tablet Take 7.5 mg by mouth daily.   Yes [provider]  metFORMIN (GLUCOPHAGE) 500 MG tablet TAKE ONE TABLET BY MOUTH EVERY DAY WITH BREAKFAST 05/21/20  Yes Iloabachie, Chioma E, NP  Tiotropium Bromide Monohydrate (SPIRIVA RESPIMAT) 2.5 MCG/ACT AERS Inhale 2 puffs into the  lungs daily. 05/10/20  Yes Kalman Shan, MD    No family history on file.   Social History   Tobacco Use  . Smoking status: Current Every Day Smoker    Packs/day: 0.25    Types: Cigarettes  . Smokeless tobacco: Never Used  . Tobacco comment: 1/2 pack/day 05/30/20  Vaping Use  . Vaping Use: Never used  Substance Use Topics  . Alcohol use: Not Currently  . Drug use: Not Currently    Allergies as of 05/30/2020  . (No Known Allergies)    Review of Systems:    All systems reviewed and negative except where noted in HPI.   Physical Exam:  BP 124/65   Pulse 84   Temp 97.8 F (36.6 C) (Oral)   Ht 5\' 1"  (1.549 m)   Wt (!) 336 lb (152.4 kg)   BMI 63.49 kg/m  No LMP recorded. Psych:  Alert and cooperative. Normal mood and affect. General:   Alert,  Well-developed, well-nourished, pleasant and cooperative in NAD Head:  Normocephalic and atraumatic. Eyes:  Sclera clear, no icterus.   Conjunctiva pink. Ears:  Normal auditory acuity. Nose:  No deformity, discharge, or lesions. Mouth:  No deformity or lesions,oropharynx pink & moist. Neck:  Supple; no masses or thyromegaly. Abdomen:  Normal bowel sounds.  No bruits.  Soft, non-tender and non-distended without masses, hepatosplenomegaly or hernias noted.  No guarding or rebound tenderness.    Msk:  Symmetrical without gross deformities. Good, equal movement &  strength bilaterally. Pulses:  Normal pulses noted. Extremities:  No clubbing or edema.  No cyanosis. Neurologic:  Alert and oriented x3;  grossly normal neurologically. Skin:  Intact without significant lesions or rashes. No jaundice. Lymph Nodes:  No significant cervical adenopathy. Psych:  Alert and cooperative. Normal mood and affect.   Labs: CBC    Component Value Date/Time   WBC 9.4 03/07/2020 1143   RBC 3.78 (L) 03/07/2020 1143   HGB 7.8 (L) 03/07/2020 1143   HGB 8.2 (L) 02/21/2020 0958   HCT 28.3 (L) 03/07/2020 1143   HCT 27.8 (L) 02/21/2020 0958    PLT 228 03/07/2020 1143   PLT 254 02/21/2020 0958   MCV 74.9 (L) 03/07/2020 1143   MCV 69 (L) 02/21/2020 0958   MCH 20.6 (L) 03/07/2020 1143   MCHC 27.6 (L) 03/07/2020 1143   RDW 20.5 (H) 03/07/2020 1143   RDW 18.2 (H) 02/21/2020 0958   LYMPHSABS 2.4 02/21/2020 0958   EOSABS 0.3 02/21/2020 0958   BASOSABS 0.1 02/21/2020 0958   CMP     Component Value Date/Time   NA 137 03/07/2020 1143   NA 137 02/21/2020 0958   K 4.0 03/07/2020 1143   CL 100 03/07/2020 1143   CO2 27 03/07/2020 1143   GLUCOSE 139 (H) 03/07/2020 1143   BUN 14 03/07/2020 1143   BUN 11 02/21/2020 0958   CREATININE 1.01 (H) 03/07/2020 1143   CALCIUM 8.7 (L) 03/07/2020 1143   PROT 7.7 03/07/2020 1143   PROT 7.2 02/21/2020 0958   ALBUMIN 4.1 03/07/2020 1143   ALBUMIN 4.1 02/21/2020 0958   AST 16 03/07/2020 1143   ALT 13 03/07/2020 1143   ALKPHOS 62 03/07/2020 1143   BILITOT 0.6 03/07/2020 1143   BILITOT <0.2 02/21/2020 0958   GFRNONAA >60 03/07/2020 1143   GFRAA >60 03/07/2020 1143    Imaging Studies: No results found.  Assessment and Plan:   Sharryn Belding is a 49 y.o. y/o female has been referred for abnormal CT  Order MRI of liver to evaluate spleen lesion seen on CT chest  Depending on findings, she may need further imaging/biopsy versus oncology referral  We discussed colon cancer screening.  Patient not interested in colonoscopy after discussing risks and benefits in detail.  She would prefer to do noninvasive testing and follow-up with PCP in this regard.  PCP can consider Cologuard or other noninvasive methods as per patient preference.  Patient was advised that if these are positive she will need colonoscopy subsequently and she verbalized understanding  She states she has an upcoming appointment with PCP soon and will discuss noninvasive CRC screening with her PCP at that time  Dr [name]  Speech recognition software was used to dictate the above note.

## 2020-05-31 ENCOUNTER — Ambulatory Visit (INDEPENDENT_AMBULATORY_CARE_PROVIDER_SITE_OTHER): Payer: Self-pay

## 2020-05-31 DIAGNOSIS — R0609 Other forms of dyspnea: Secondary | ICD-10-CM

## 2020-05-31 DIAGNOSIS — R0600 Dyspnea, unspecified: Secondary | ICD-10-CM

## 2020-05-31 MED ORDER — PERFLUTREN LIPID MICROSPHERE
1.0000 mL | INTRAVENOUS | Status: AC | PRN
  Administered 2020-05-31: 2 mL via INTRAVENOUS

## 2020-06-03 LAB — ECHOCARDIOGRAM COMPLETE
AR max vel: 3.25 cm2
AV Area VTI: 3.18 cm2
AV Area mean vel: 2.93 cm2
AV Mean grad: 7 mmHg
AV Peak grad: 13.2 mmHg
Ao pk vel: 1.82 m/s
Area-P 1/2: 2.62 cm2
S' Lateral: 3.6 cm
Single Plane A4C EF: 44.1 %

## 2020-06-11 ENCOUNTER — Other Ambulatory Visit: Payer: Self-pay

## 2020-06-11 DIAGNOSIS — D7389 Other diseases of spleen: Secondary | ICD-10-CM

## 2020-06-12 ENCOUNTER — Other Ambulatory Visit: Payer: Self-pay

## 2020-06-13 ENCOUNTER — Other Ambulatory Visit: Payer: Self-pay

## 2020-06-13 ENCOUNTER — Ambulatory Visit
Admission: RE | Admit: 2020-06-13 | Discharge: 2020-06-13 | Disposition: A | Discharge status: Home / Self Care | Payer: Self-pay | Source: Ambulatory Visit | Attending: Gastroenterology | Admitting: Gastroenterology

## 2020-06-13 DIAGNOSIS — D7389 Other diseases of spleen: Secondary | ICD-10-CM | POA: Insufficient documentation

## 2020-06-13 MED ORDER — GADOBUTROL 1 MMOL/ML IV SOLN
10.0000 mL | Freq: Once | INTRAVENOUS | Status: AC | PRN
  Administered 2020-06-13: 10 mL via INTRAVENOUS

## 2020-06-18 ENCOUNTER — Telehealth: Payer: Self-pay

## 2020-06-18 NOTE — Telephone Encounter (Signed)
-----   Message from Pasty Spillers, MD sent at 06/18/2020 11:20 AM EST ----- [name] please let the patient know, her MRI did not show any concerning lesions in the spleen.  However, it suggests fatty liver.  This is a finding that is also benign and can reverse with diet, weight loss and exercise.  I recommend follow-up with our clinic in 3 to 6 months for this.  However, the MRI also suggest possible cardiac dysfunction.  I am copying patient's PCP on this note for their review, to see if they would like to refer you to cardiology.  Please asked the patient to follow-up with her PCP to see if she needs a cardiology referral

## 2020-06-18 NOTE — Telephone Encounter (Signed)
Called patient to let her know the below results. Patient agreed on her follow up appointment and to contact her PCP in referral to the possible cardiac dysfunction and find out with her if she will need a referral to the cardiologist. Patient had no further questions. Patient will be placed in our recall list to remind her of her appointment.

## 2020-06-20 ENCOUNTER — Telehealth: Payer: Self-pay | Admitting: Internal Medicine

## 2020-06-20 DIAGNOSIS — I272 Pulmonary hypertension, unspecified: Secondary | ICD-10-CM

## 2020-06-20 NOTE — Telephone Encounter (Signed)
Margie  Echo shows evidence of Pulm Htn  Plan  - she neds to complete rest of workup per my recent ov -> then she can be given appt with app for followup  - refer to Dr Gala Romney or Dr Marca Ancona in [facility] for Right heart cath  Thanks  MR

## 2020-06-24 NOTE — Telephone Encounter (Signed)
Lm for patient.  

## 2020-06-26 ENCOUNTER — Other Ambulatory Visit: Payer: Self-pay

## 2020-06-26 ENCOUNTER — Ambulatory Visit: Payer: Self-pay | Admitting: Gerontology

## 2020-06-26 ENCOUNTER — Other Ambulatory Visit: Payer: Self-pay | Admitting: Gerontology

## 2020-06-26 VITALS — BP 160/90 | HR 60 | Temp 97.3°F | Resp 16 | Wt 336.8 lb

## 2020-06-26 DIAGNOSIS — Z0000 Encounter for general adult medical examination without abnormal findings: Secondary | ICD-10-CM

## 2020-06-26 DIAGNOSIS — M79672 Pain in left foot: Secondary | ICD-10-CM

## 2020-06-26 DIAGNOSIS — M79671 Pain in right foot: Secondary | ICD-10-CM

## 2020-06-26 DIAGNOSIS — R7989 Other specified abnormal findings of blood chemistry: Secondary | ICD-10-CM

## 2020-06-26 DIAGNOSIS — R0600 Dyspnea, unspecified: Secondary | ICD-10-CM

## 2020-06-26 DIAGNOSIS — E119 Type 2 diabetes mellitus without complications: Secondary | ICD-10-CM

## 2020-06-26 DIAGNOSIS — G4733 Obstructive sleep apnea (adult) (pediatric): Secondary | ICD-10-CM

## 2020-06-26 DIAGNOSIS — R600 Localized edema: Secondary | ICD-10-CM

## 2020-06-26 DIAGNOSIS — Z8709 Personal history of other diseases of the respiratory system: Secondary | ICD-10-CM

## 2020-06-26 DIAGNOSIS — R0609 Other forms of dyspnea: Secondary | ICD-10-CM

## 2020-06-26 DIAGNOSIS — I10 Essential (primary) hypertension: Secondary | ICD-10-CM

## 2020-06-26 LAB — POCT GLYCOSYLATED HEMOGLOBIN (HGB A1C)
HbA1c POC (<> result, manual entry): 0 % (ref 4.0–5.6)
HbA1c, POC (controlled diabetic range): 0 % (ref 0.0–7.0)
HbA1c, POC (prediabetic range): 0 % — AB (ref 5.7–6.4)
Hemoglobin A1C: 6.1 % — AB (ref 4.0–5.6)

## 2020-06-26 MED ORDER — HYDROCHLOROTHIAZIDE 12.5 MG PO TABS: 12.5000 mg | ORAL_TABLET | Freq: Every day | ORAL | 0 refills | Status: AC

## 2020-06-26 MED ORDER — GABAPENTIN 100 MG PO CAPS: 200.0000 mg | ORAL_CAPSULE | Freq: Every day | ORAL | 1 refills | Status: AC

## 2020-06-26 MED ORDER — FLUTICASONE-SALMETEROL 250-50 MCG/DOSE IN AEPB: 1.0000 | INHALATION_SPRAY | Freq: Two times a day (BID) | RESPIRATORY_TRACT | 3 refills | Status: AC

## 2020-06-26 MED ORDER — ALBUTEROL SULFATE HFA 108 (90 BASE) MCG/ACT IN AERS: 2.0000 | INHALATION_SPRAY | Freq: Four times a day (QID) | RESPIRATORY_TRACT | 3 refills | Status: AC | PRN

## 2020-06-26 NOTE — Telephone Encounter (Addendum)
Patient is aware of results and voiced her understanding.  Referral has been placed to cardiology as patient is agreeable. appointment scheduled for 08/30/2019 at 2:30. This is first available with MR. APP's schedules are full for Dec and schedule is not out for Jan.  Patient does not wish to drive to [place].   Nothing further needed.

## 2020-06-26 NOTE — Progress Notes (Signed)
Established Patient Office Visit  Subjective:  Patient ID: [name], female    DOB: 1970/08/05  Age: 49 y.o. MRN: [id number]  CC: No chief complaint on file.   HPI [name] presents for follow up shortness of breath, bilateral lower leg edema and type 2 diabetes. She is compliant with her medications and continues to make healthy lifestyle changes. Her HgbA1c done during visit decreased from 6.6% to 6.1%, and blood glucose was 99 mg/dl.  She denies hypoglycemia, but endorses polyuria. She continues to experience intermittent shortness of breath with exertion and uses albuterol as needed. 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 Liver MRI was recommended. She continues to experience bilateral lower extremity edema and is yet to follow up with Cardiologist and she will follow up with Vascular Surgery on 07/08/2020. She has a history of sleep apnea and states that her CPAP is not functional. Her blood pressure was elevated during visit, she smokes 1-2 cigarettes daily and admits the desire to quit. Overall, she states that she's doing well and offers no further complaint.  Past Medical History:  Diagnosis Date   Asthma     No past surgical history on file.  No family history on file.  Social History   Socioeconomic History   Marital status: Single    Spouse name: Not on file   Number of children: Not on file   Years of education: Not on file   Highest education level: Not on file  Occupational History   Not on file  Tobacco Use   Smoking status: Current Every Day Smoker    Packs/day: 0.25    Types: Cigarettes   Smokeless tobacco: Never Used   Tobacco comment: 1/2 pack/day 05/30/20  Vaping Use   Vaping Use: Never used  Substance and Sexual Activity   Alcohol use: Not Currently   Drug use: Not Currently   Sexual activity:  Not on file  Other Topics Concern   Not on file  Social History Narrative   Not on file   Social Determinants of Health   Financial Resource Strain:    Difficulty of Paying Living Expenses: Not on file  Food Insecurity:    Worried About Running Out of Food in the Last Year: Not on file   [employer] of Food in the Last Year: Not on file  Transportation Needs:    Lack of Transportation (Medical): Not on file   Lack of Transportation (Non-Medical): Not on file  Physical Activity:    Days of Exercise per Week: Not on file   Minutes of Exercise per Session: Not on file  Stress:    Feeling of Stress : Not on file  Social Connections:    Frequency of Communication with Friends and Family: Not on file   Frequency of Social Gatherings with Friends and Family: Not on file   Attends Religious Services: Not on file   Active Member of Clubs or Organizations: Not on file   Attends Banker Meetings: Not on file   Marital Status: Not on file  Intimate Partner Violence:    Fear of Current or Ex-Partner: Not on file   Emotionally Abused: Not on file   Physically Abused: Not on file   Sexually Abused: Not on file    Outpatient Medications Prior to Visit  Medication Sig Dispense Refill   meloxicam (MOBIC) 7.5 MG  tablet Take 7.5 mg by mouth daily.     metFORMIN (GLUCOPHAGE) 500 MG tablet TAKE ONE TABLET BY MOUTH EVERY DAY WITH BREAKFAST 30 tablet 3   Tiotropium Bromide Monohydrate (SPIRIVA RESPIMAT) 2.5 MCG/ACT AERS Inhale 2 puffs into the lungs daily. 4 g 6   albuterol (VENTOLIN HFA) 108 (90 Base) MCG/ACT inhaler Inhale 2 puffs into the lungs every 6 (six) hours as needed for wheezing or shortness of breath. 18 g 3   Fluticasone-Salmeterol (ADVAIR) 250-50 MCG/DOSE AEPB Inhale 1 puff into the lungs 2 (two) times daily. 60 each 3   gabapentin (NEURONTIN) 100 MG capsule TAKE 2 CAPSULES BY MOUTH AT BEDTIME 60 capsule 0   No facility-administered medications  prior to visit.    No Known Allergies  ROS Review of Systems  Constitutional: Negative.   HENT: Sinus pain: neurology.   Respiratory: Positive for shortness of breath (exertion).   Cardiovascular: Positive for leg swelling.  Gastrointestinal: Negative.   Endocrine: Positive for polyuria.  Neurological: Negative.   Psychiatric/Behavioral: Negative.       Objective:    Physical Exam HENT:     Head: Normocephalic and atraumatic.  Cardiovascular:     Rate and Rhythm: Normal rate and regular rhythm.     Pulses: Normal pulses.     Heart sounds: Normal heart sounds.  Pulmonary:     Breath sounds: Examination of the right-upper field reveals wheezing. Examination of the left-upper field reveals wheezing. Examination of the right-middle field reveals wheezing. Examination of the left-middle field reveals wheezing. Wheezing (expiratory wheezes) present.  Abdominal:     General: Bowel sounds are normal.     Palpations: Abdomen is soft.  Skin:    General: Skin is warm.  Neurological:     General: No focal deficit present.     Mental Status: She is alert and oriented to person, place, and time. Mental status is at baseline.  Psychiatric:        Mood and Affect: Mood normal.        Behavior: Behavior normal.        Thought Content: Thought content normal.        Judgment: Judgment normal.     BP (!) 160/90 (BP Location: Right Arm, Patient Position: Sitting, Cuff Size: Large)    Pulse 60    Temp (!) 97.3 F (36.3 C)    Resp 16    Wt (!) 336 lb 12.8 oz (152.8 kg)    SpO2 95%    BMI 63.64 kg/m  Wt Readings from Last 3 Encounters:  06/26/20 (!) 336 lb 12.8 oz (152.8 kg)  05/30/20 (!) 336 lb (152.4 kg)  05/28/20 (!) 336 lb 12.8 oz (152.8 kg)   She was encouraged to loose weight  Health Maintenance Due  Topic Date Due   Hepatitis C Screening  Never done   PNEUMOCOCCAL POLYSACCHARIDE VACCINE AGE 60-64 HIGH RISK  Never done   FOOT EXAM  Never done   OPHTHALMOLOGY EXAM  Never  done   COVID-19 Vaccine (1) Never done   HIV Screening  Never done   TETANUS/TDAP  Never done   PAP SMEAR-Modifier  Never done   MAMMOGRAM  Never done   COLONOSCOPY  Never done   INFLUENZA VACCINE  02/25/2020    There are no preventive care reminders to display for this patient.  Lab Results  Component Value Date   TSH 5.230 (H) 02/21/2020   Lab Results  Component Value Date   WBC 9.4 03/07/2020  HGB 7.8 (L) 03/07/2020   HCT 28.3 (L) 03/07/2020   MCV 74.9 (L) 03/07/2020   PLT 228 03/07/2020   Lab Results  Component Value Date   NA 137 03/07/2020   K 4.0 03/07/2020   CO2 27 03/07/2020   GLUCOSE 139 (H) 03/07/2020   BUN 14 03/07/2020   CREATININE 1.01 (H) 03/07/2020   BILITOT 0.6 03/07/2020   ALKPHOS 62 03/07/2020   AST 16 03/07/2020   ALT 13 03/07/2020   PROT 7.7 03/07/2020   ALBUMIN 4.1 03/07/2020   CALCIUM 8.7 (L) 03/07/2020   ANIONGAP 10 03/07/2020   Lab Results  Component Value Date   CHOL 137 02/21/2020   Lab Results  Component Value Date   HDL 32 (L) 02/21/2020   Lab Results  Component Value Date   LDLCALC 73 02/21/2020   Lab Results  Component Value Date   TRIG 186 (H) 02/21/2020   Lab Results  Component Value Date   CHOLHDL 4.3 02/21/2020   Lab Results  Component Value Date   HGBA1C 6.6 (A) 02/08/2020      Assessment & Plan:   1. Bilateral leg edema -She will follow up with Cardiologist, advised to elevate leg and wear compression stockings. - Ambulatory referral to Cardiology - Hepatic function panel - Basic Metabolic Panel (BMET) - CBC w/Diff - IgE  2. OSA (obstructive sleep apnea) - She was advised to complete [facility] Financial application for - Ambulatory referral to Neurology  3. Type 2 diabetes mellitus without complication, without long-term current use of insulin (HCC) - Her HgbA1c was 6.1%, will continue on current treatment regimen, low carb/non concentrated sweet diet and exercise as tolerated. - POCT HgB A1C -  HgB A1c  4. Healthcare maintenance  - Flu Vaccine QUAD 6+ mos PF IM (Fluarix Quad PF) was administered  5. History of COPD - Her COPD is under control, will continue on treatment regimen, advised on smoking cessation. - albuterol (VENTOLIN HFA) 108 (90 Base) MCG/ACT inhaler; Inhale 2 puffs into the lungs every 6 (six) hours as needed for wheezing or shortness of breath.  Dispense: 18 g; Refill: 3 - Fluticasone-Salmeterol (ADVAIR) 250-50 MCG/DOSE AEPB; Inhale 1 puff into the lungs 2 (two) times daily.  Dispense: 60 each; Refill: 3  6. Foot pain, bilateral-  -Pain is under control with taking Gabapentin, will continue on current treatment regimen. - gabapentin (NEURONTIN) 100 MG capsule; Take 2 capsules (200 mg total) by mouth at bedtime.  Dispense: 60 capsule; Refill: 1  7. Essential hypertension - Her blood pressure was elevated, will start on 12.5 mg HCTZ, educated on medication side effects and to notify clinic. She was to measure blood pressure, record and bring log to follow up appointment, DASH diet and exercise as tolerated. - hydrochlorothiazide (HYDRODIURIL) 12.5 MG tablet; Take 1 tablet (12.5 mg total) by mouth daily.  Dispense: 30 tablet; Refill: 0  8. Dyspnea on exertion - She will continue to use inhaler and follow up wit Pulmonology.      Follow-up: Return in about 29 days (around 07/25/2020), or if symptoms worsen or fail to improve.    Koa Zoeller Trellis Paganini, NP

## 2020-06-26 NOTE — Patient Instructions (Signed)
Edema  Edema is when you have too much fluid in your body or under your skin. Edema may make your legs, feet, and ankles swell up. Swelling is also common in looser tissues, like around your eyes. This is a common condition. It gets more common as you get older. There are many possible causes of edema. Eating too much salt (sodium) and being on your feet or sitting for a long time can cause edema in your legs, feet, and ankles. Hot weather may make edema worse. Edema is usually painless. Your skin may look swollen or shiny. Follow these instructions at home:  Keep the swollen body part raised (elevated) above the level of your heart when you are sitting or lying down.  Do not sit still or stand for a long time.  Do not wear tight clothes. Do not wear garters on your upper legs.  Exercise your legs. This can help the swelling go down.  Wear elastic bandages or support stockings as told by your doctor.  Eat a low-salt (low-sodium) diet to reduce fluid as told by your doctor.  Depending on the cause of your swelling, you may need to limit how much fluid you drink (fluid restriction).  Take over-the-counter and prescription medicines only as told by your doctor. Contact a doctor if:  Treatment is not working.  You have heart, liver, or kidney disease and have symptoms of edema.  You have sudden and unexplained weight gain. Get help right away if:  You have shortness of breath or chest pain.  You cannot breathe when you lie down.  You have pain, redness, or warmth in the swollen areas.  You have heart, liver, or kidney disease and get edema all of a sudden.  You have a fever and your symptoms get worse all of a sudden. Summary  Edema is when you have too much fluid in your body or under your skin.  Edema may make your legs, feet, and ankles swell up. Swelling is also common in looser tissues, like around your eyes.  Raise (elevate) the swollen body part above the level of your  heart when you are sitting or lying down.  Follow your doctor's instructions about diet and how much fluid you can drink (fluid restriction). This information is not intended to replace advice given to you by your health care provider. Make sure you discuss any questions you have with your health care provider. Document Revised: 07/16/2017 Document Reviewed: 07/31/2016 Elsevier Patient Education  2020 Elsevier Inc.  DASH Eating Plan DASH stands for "Dietary Approaches to Stop Hypertension." The DASH eating plan is a healthy eating plan that has been shown to reduce high blood pressure (hypertension). It may also reduce your risk for type 2 diabetes, heart disease, and stroke. The DASH eating plan may also help with weight loss. What are tips for following this plan?  General guidelines  Avoid eating more than 2,300 mg (milligrams) of salt (sodium) a day. If you have hypertension, you may need to reduce your sodium intake to 1,500 mg a day.  Limit alcohol intake to no more than 1 drink a day for nonpregnant women and 2 drinks a day for men. One drink equals 12 oz of beer, 5 oz of wine, or 1 oz of hard liquor.  Work with your health care provider to maintain a healthy body weight or to lose weight. Ask what an ideal weight is for you.  Get at least 30 minutes of exercise that causes your   heart to beat faster (aerobic exercise) most days of the week. Activities may include walking, swimming, or biking.  Work with your health care provider or diet and nutrition specialist (dietitian) to adjust your eating plan to your individual calorie needs. Reading food labels   Check food labels for the amount of sodium per serving. Choose foods with less than 5 percent of the Daily Value of sodium. Generally, foods with less than 300 mg of sodium per serving fit into this eating plan.  To find whole grains, look for the word "whole" as the first word in the ingredient list. Shopping  Buy products  labeled as "low-sodium" or "no salt added."  Buy fresh foods. Avoid canned foods and premade or frozen meals. Cooking  Avoid adding salt when cooking. Use salt-free seasonings or herbs instead of table salt or sea salt. Check with your health care provider or pharmacist before using salt substitutes.  Do not fry foods. Cook foods using healthy methods such as baking, boiling, grilling, and broiling instead.  Cook with heart-healthy oils, such as olive, canola, soybean, or sunflower oil. Meal planning  Eat a balanced diet that includes: ? 5 or more servings of fruits and vegetables each day. At each meal, try to fill half of your plate with fruits and vegetables. ? Up to 6-8 servings of whole grains each day. ? Less than 6 oz of lean meat, poultry, or fish each day. A 3-oz serving of meat is about the same size as a deck of cards. One egg equals 1 oz. ? 2 servings of low-fat dairy each day. ? A serving of nuts, seeds, or beans 5 times each week. ? Heart-healthy fats. Healthy fats called Omega-3 fatty acids are found in foods such as flaxseeds and coldwater fish, like sardines, salmon, and mackerel.  Limit how much you eat of the following: ? Canned or prepackaged foods. ? Food that is high in trans fat, such as fried foods. ? Food that is high in saturated fat, such as fatty meat. ? Sweets, desserts, sugary drinks, and other foods with added sugar. ? Full-fat dairy products.  Do not salt foods before eating.  Try to eat at least 2 vegetarian meals each week.  Eat more home-cooked food and less restaurant, buffet, and fast food.  When eating at a restaurant, ask that your food be prepared with less salt or no salt, if possible. What foods are recommended? The items listed may not be a complete list. Talk with your dietitian about what dietary choices are best for you. Grains Whole-grain or whole-wheat bread. Whole-grain or whole-wheat pasta. Brown rice. Oatmeal. Quinoa. Bulgur.  Whole-grain and low-sodium cereals. Pita bread. Low-fat, low-sodium crackers. Whole-wheat flour tortillas. Vegetables Fresh or frozen vegetables (raw, steamed, roasted, or grilled). Low-sodium or reduced-sodium tomato and vegetable juice. Low-sodium or reduced-sodium tomato sauce and tomato paste. Low-sodium or reduced-sodium canned vegetables. Fruits All fresh, dried, or frozen fruit. Canned fruit in natural juice (without added sugar). Meat and other protein foods Skinless chicken or turkey. Ground chicken or turkey. Pork with fat trimmed off. Fish and seafood. Egg whites. Dried beans, peas, or lentils. Unsalted nuts, nut butters, and seeds. Unsalted canned beans. Lean cuts of beef with fat trimmed off. Low-sodium, lean deli meat. Dairy Low-fat (1%) or fat-free (skim) milk. Fat-free, low-fat, or reduced-fat cheeses. Nonfat, low-sodium ricotta or cottage cheese. Low-fat or nonfat yogurt. Low-fat, low-sodium cheese. Fats and oils Soft margarine without trans fats. Vegetable oil. Low-fat, reduced-fat, or light mayonnaise and   salad dressings (reduced-sodium). Canola, safflower, olive, soybean, and sunflower oils. Avocado. Seasoning and other foods Herbs. Spices. Seasoning mixes without salt. Unsalted popcorn and pretzels. Fat-free sweets. What foods are not recommended? The items listed may not be a complete list. Talk with your dietitian about what dietary choices are best for you. Grains Baked goods made with fat, such as croissants, muffins, or some breads. Dry pasta or rice meal packs. Vegetables Creamed or fried vegetables. Vegetables in a cheese sauce. Regular canned vegetables (not low-sodium or reduced-sodium). Regular canned tomato sauce and paste (not low-sodium or reduced-sodium). Regular tomato and vegetable juice (not low-sodium or reduced-sodium). Pickles. Olives. Fruits Canned fruit in a light or heavy syrup. Fried fruit. Fruit in cream or butter sauce. Meat and other protein  foods Fatty cuts of meat. Ribs. Fried meat. Bacon. Sausage. Bologna and other processed lunch meats. Salami. Fatback. Hotdogs. Bratwurst. Salted nuts and seeds. Canned beans with added salt. Canned or smoked fish. Whole eggs or egg yolks. Chicken or turkey with skin. Dairy Whole or 2% milk, cream, and half-and-half. Whole or full-fat cream cheese. Whole-fat or sweetened yogurt. Full-fat cheese. Nondairy creamers. Whipped toppings. Processed cheese and cheese spreads. Fats and oils Butter. Stick margarine. Lard. Shortening. Ghee. Bacon fat. Tropical oils, such as coconut, palm kernel, or palm oil. Seasoning and other foods Salted popcorn and pretzels. Onion salt, garlic salt, seasoned salt, table salt, and sea salt. Worcestershire sauce. Tartar sauce. Barbecue sauce. Teriyaki sauce. Soy sauce, including reduced-sodium. Steak sauce. Canned and packaged gravies. Fish sauce. Oyster sauce. Cocktail sauce. Horseradish that you find on the shelf. Ketchup. Mustard. Meat flavorings and tenderizers. Bouillon cubes. Hot sauce and Tabasco sauce. Premade or packaged marinades. Premade or packaged taco seasonings. Relishes. Regular salad dressings. Where to find more information:  National Heart, Lung, and Blood Institute: www.nhlbi.nih.gov  American Heart Association: www.heart.org Summary  The DASH eating plan is a healthy eating plan that has been shown to reduce high blood pressure (hypertension). It may also reduce your risk for type 2 diabetes, heart disease, and stroke.  With the DASH eating plan, you should limit salt (sodium) intake to 2,300 mg a day. If you have hypertension, you may need to reduce your sodium intake to 1,500 mg a day.  When on the DASH eating plan, aim to eat more fresh fruits and vegetables, whole grains, lean proteins, low-fat dairy, and heart-healthy fats.  Work with your health care provider or diet and nutrition specialist (dietitian) to adjust your eating plan to your  individual calorie needs. This information is not intended to replace advice given to you by your health care provider. Make sure you discuss any questions you have with your health care provider. Document Revised: 06/25/2017 Document Reviewed: 07/06/2016 Elsevier Patient Education  2020 Elsevier Inc.  

## 2020-06-27 LAB — SPECIMEN STATUS REPORT

## 2020-06-28 LAB — SPECIMEN STATUS REPORT

## 2020-06-28 LAB — HEMOGLOBIN A1C
Est. average glucose Bld gHb Est-mCnc: 134 mg/dL
Hgb A1c MFr Bld: 6.3 % — ABNORMAL HIGH (ref 4.8–5.6)

## 2020-07-05 ENCOUNTER — Telehealth: Payer: Self-pay | Admitting: Pharmacist

## 2020-07-05 NOTE — Telephone Encounter (Signed)
07/05/2020 2:32:10 PM - Advair 250/50 pending  -- Rhetta Mura - Friday, July 05, 2020 2:31 PM --I have received the provider signed script for Advair 250/50, holding for patient to sign her portion-put in bag with meds on the wall to pick up 06/28/2020.

## 2020-07-05 NOTE — Progress Notes (Unsigned)
   Patient ID: [name], female    DOB: 1971/04/03, 49 y.o.   MRN: [id number]  HPI  [name] is a 49 y/o female with a history of  Echo report from 05/31/20 reviewed and showed an EF of 55% along with mild LVH, moderately elevated PA pressure of 59.4 mmHg, moderate LAE, moderate MR and mild/moderate TR.   Was in the ED 03/07/20 due to shortness of breath and leg pain. [place] negative for DVT. CT was negative for PE. Treated and released.   She presents today for her initial visit with a chief complaint of  Review of Systems    Physical Exam    Assessment & Plan:  1: Chronic heart failure with preserved ejection fraction with structural changes (LVH/ LAE)- - NYHA class - BNP 03/07/20 was 290.7  2: HTN- - BP - saw PCP ([name]) 06/26/20 - BMP 03/07/20 reviewed and showed sodium 137, potassium 4.0, creatinine 1.01 and GFR >60  3: DM- - A1c 06/26/20 was 6.1%  4: COPD- - saw pulmonology [name]) 05/10/20  5: Splenic lesion- - saw GI [name]) 05/30/20 - abdominal MRI completed 06/13/20  6: Lymphedema- -- saw vascular ([name]) 04/08/20

## 2020-07-08 ENCOUNTER — Ambulatory Visit (INDEPENDENT_AMBULATORY_CARE_PROVIDER_SITE_OTHER): Payer: Self-pay | Admitting: Vascular Surgery

## 2020-07-08 ENCOUNTER — Ambulatory Visit: Payer: Self-pay | Admitting: Family

## 2020-07-08 ENCOUNTER — Telehealth: Payer: Self-pay | Admitting: Family

## 2020-07-08 NOTE — Telephone Encounter (Signed)
Patient did not show for her initial [facility] Failure [facility] appointment on 07/08/20. Will attempt to reschedule.

## 2020-07-23 ENCOUNTER — Ambulatory Visit: Payer: Self-pay | Admitting: Family

## 2020-07-24 ENCOUNTER — Telehealth: Payer: Self-pay | Admitting: Pharmacist

## 2020-07-24 NOTE — Telephone Encounter (Signed)
07/24/2020 2:16:35 PM - [id number] application faxed to [facility]  -- Rhetta Mura - Wednesday, July 24, 2020 2:15 PM --Faxed [employer] application for enrollment-Spiriva Respimat 2.50mcg, also patient signed letter for meds to be shipped to [place].

## 2020-07-25 ENCOUNTER — Ambulatory Visit: Payer: Self-pay

## 2020-08-01 ENCOUNTER — Other Ambulatory Visit: Payer: Self-pay

## 2020-08-01 ENCOUNTER — Encounter: Payer: Self-pay | Admitting: Family

## 2020-08-01 ENCOUNTER — Ambulatory Visit: Payer: Self-pay | Attending: Family | Admitting: Family

## 2020-08-01 VITALS — BP 135/65 | HR 74 | Resp 20 | Ht 61.0 in | Wt 303.4 lb

## 2020-08-01 DIAGNOSIS — Z791 Long term (current) use of non-steroidal anti-inflammatories (NSAID): Secondary | ICD-10-CM | POA: Insufficient documentation

## 2020-08-01 DIAGNOSIS — E119 Type 2 diabetes mellitus without complications: Secondary | ICD-10-CM

## 2020-08-01 DIAGNOSIS — D739 Disease of spleen, unspecified: Secondary | ICD-10-CM | POA: Insufficient documentation

## 2020-08-01 DIAGNOSIS — I5032 Chronic diastolic (congestive) heart failure: Secondary | ICD-10-CM

## 2020-08-01 DIAGNOSIS — F17210 Nicotine dependence, cigarettes, uncomplicated: Secondary | ICD-10-CM | POA: Insufficient documentation

## 2020-08-01 DIAGNOSIS — I110 Hypertensive heart disease with heart failure: Secondary | ICD-10-CM | POA: Insufficient documentation

## 2020-08-01 DIAGNOSIS — Z7951 Long term (current) use of inhaled steroids: Secondary | ICD-10-CM | POA: Insufficient documentation

## 2020-08-01 DIAGNOSIS — D7389 Other diseases of spleen: Secondary | ICD-10-CM

## 2020-08-01 DIAGNOSIS — J449 Chronic obstructive pulmonary disease, unspecified: Secondary | ICD-10-CM

## 2020-08-01 DIAGNOSIS — Z79899 Other long term (current) drug therapy: Secondary | ICD-10-CM | POA: Insufficient documentation

## 2020-08-01 DIAGNOSIS — I890 Lymphedema, not elsewhere classified: Secondary | ICD-10-CM

## 2020-08-01 DIAGNOSIS — I10 Essential (primary) hypertension: Secondary | ICD-10-CM

## 2020-08-01 DIAGNOSIS — Z7984 Long term (current) use of oral hypoglycemic drugs: Secondary | ICD-10-CM | POA: Insufficient documentation

## 2020-08-01 NOTE — Patient Instructions (Addendum)
Continue weighing daily and call for an overnight weight gain of > 2 pounds or a weekly weight gain of >5 pounds.   Low-Sodium Eating Plan Sodium, which is an element that makes up salt, helps you maintain a healthy balance of fluids in your body. Too much sodium can increase your blood pressure and cause fluid and waste to be held in your body. Your health care provider or dietitian may recommend following this plan if you have high blood pressure (hypertension), kidney disease, liver disease, or heart failure. Eating less sodium can help lower your blood pressure, reduce swelling, and protect your heart, liver, and kidneys. What are tips for following this plan? General guidelines  Most people on this plan should limit their sodium intake to 2,000 mg (milligrams) of sodium each day. Reading food labels   The Nutrition Facts label lists the amount of sodium in one serving of the food. If you eat more than one serving, you must multiply the listed amount of sodium by the number of servings.  Choose foods with less than 140 mg of sodium per serving.  Avoid foods with 300 mg of sodium or more per serving. Shopping  Look for lower-sodium products, often labeled as "low-sodium" or "no salt added."  Always check the sodium content even if foods are labeled as "unsalted" or "no salt added".  Buy fresh foods. ? Avoid canned foods and premade or frozen meals. ? Avoid canned, cured, or processed meats  Buy breads that have less than 80 mg of sodium per slice. Cooking  Eat more home-cooked food and less restaurant, buffet, and fast food.  Avoid adding salt when cooking. Use salt-free seasonings or herbs instead of table salt or sea salt. Check with your health care provider or pharmacist before using salt substitutes.  Cook with plant-based oils, such as canola, sunflower, or olive oil. Meal planning  When eating at a restaurant, ask that your food be prepared with less salt or no salt,  if possible.  Avoid foods that contain MSG (monosodium glutamate). MSG is sometimes added to Chinese food, bouillon, and some canned foods. What foods are recommended? The items listed may not be a complete list. Talk with your dietitian about what dietary choices are best for you. Grains Low-sodium cereals, including oats, puffed wheat and rice, and shredded wheat. Low-sodium crackers. Unsalted rice. Unsalted pasta. Low-sodium bread. Whole-grain breads and whole-grain pasta. Vegetables Fresh or frozen vegetables. "No salt added" canned vegetables. "No salt added" tomato sauce and paste. Low-sodium or reduced-sodium tomato and vegetable juice. Fruits Fresh, frozen, or canned fruit. Fruit juice. Meats and other protein foods Fresh or frozen (no salt added) meat, poultry, seafood, and fish. Low-sodium canned tuna and salmon. Unsalted nuts. Dried peas, beans, and lentils without added salt. Unsalted canned beans. Eggs. Unsalted nut butters. Dairy Milk. Soy milk. Cheese that is naturally low in sodium, such as ricotta cheese, fresh mozzarella, or Swiss cheese Low-sodium or reduced-sodium cheese. Cream cheese. Yogurt. Fats and oils Unsalted butter. Unsalted margarine with no trans fat. Vegetable oils such as canola or olive oils. Seasonings and other foods Fresh and dried herbs and spices. Salt-free seasonings. Low-sodium mustard and ketchup. Sodium-free salad dressing. Sodium-free light mayonnaise. Fresh or refrigerated horseradish. Lemon juice. Vinegar. Homemade, reduced-sodium, or low-sodium soups. Unsalted popcorn and pretzels. Low-salt or salt-free chips. What foods are not recommended? The items listed may not be a complete list. Talk with your dietitian about what dietary choices are best for you. Grains Instant hot   cereals. Bread stuffing, pancake, and biscuit mixes. Croutons. Seasoned rice or pasta mixes. Noodle soup cups. Boxed or frozen macaroni and cheese. Regular salted crackers.  Self-rising flour. Vegetables Sauerkraut, pickled vegetables, and relishes. Olives. French fries. Onion rings. Regular canned vegetables (not low-sodium or reduced-sodium). Regular canned tomato sauce and paste (not low-sodium or reduced-sodium). Regular tomato and vegetable juice (not low-sodium or reduced-sodium). Frozen vegetables in sauces. Meats and other protein foods Meat or fish that is salted, canned, smoked, spiced, or pickled. Bacon, ham, sausage, hotdogs, corned beef, chipped beef, packaged lunch meats, salt pork, jerky, pickled herring, anchovies, regular canned tuna, sardines, salted nuts. Dairy Processed cheese and cheese spreads. Cheese curds. Blue cheese. Feta cheese. String cheese. Regular cottage cheese. Buttermilk. Canned milk. Fats and oils Salted butter. Regular margarine. Ghee. Bacon fat. Seasonings and other foods Onion salt, garlic salt, seasoned salt, table salt, and sea salt. Canned and packaged gravies. Worcestershire sauce. Tartar sauce. Barbecue sauce. Teriyaki sauce. Soy sauce, including reduced-sodium. Steak sauce. Fish sauce. Oyster sauce. Cocktail sauce. Horseradish that you find on the shelf. Regular ketchup and mustard. Meat flavorings and tenderizers. Bouillon cubes. Hot sauce and Tabasco sauce. Premade or packaged marinades. Premade or packaged taco seasonings. Relishes. Regular salad dressings. Salsa. Potato and tortilla chips. Corn chips and puffs. Salted popcorn and pretzels. Canned or dried soups. Pizza. Frozen entrees and pot pies. Summary  Eating less sodium can help lower your blood pressure, reduce swelling, and protect your heart, liver, and kidneys.  Most people on this plan should limit their sodium intake to 1,500-2,000 mg (milligrams) of sodium each day.  Canned, boxed, and frozen foods are high in sodium. Restaurant foods, fast foods, and pizza are also very high in sodium. You also get sodium by adding salt to food.  Try to cook at home, eat  more fresh fruits and vegetables, and eat less fast food, canned, processed, or prepared foods. This information is not intended to replace advice given to you by your health care provider. Make sure you discuss any questions you have with your health care provider. Document Revised: 06/25/2017 Document Reviewed: 07/06/2016 Elsevier Patient Education  2020 [employer] Inc.  

## 2020-08-01 NOTE — Progress Notes (Signed)
Patient ID: [name], female    DOB: [birth year], 50 y.o.   MRN: [id number]  HPI  [name] is a 50 y/o female with a history of asthma, DM, HTN, COPD, splenic lesion, anemia,  lymphedema, current tobacco use and chronic heart failure.   Echo report from 05/31/20 reviewed and showed an EF of 55% along with mild LVH, moderately elevated PA pressure of 59.4 mmHg, moderate LAE, moderate MR and mild/moderate TR.   Was in the ED 03/07/20 due to shortness of breath and leg pain. [place] negative for DVT. CT was negative for PE. Treated and released.   She presents today for her initial visit with a chief complaint of moderate shortness of breath with little exertion. She describes this as having been present for several months with varying levels of severity. She has associated fatigue, cough, wheezing, pedal edema (improving), palpitations and light-headedness along with this. She denies any difficulty sleeping, abdominal distention or chest pain.   Has been inconsistently weighing herself but thinks her scales aren't correct. She says that she has analog scales with the dial.   Says that she doesn't drink a lot of fluids but chews on crushed ice "all the time".   Past Medical History:  Diagnosis Date  . Anemia   . Asthma   . CHF (congestive heart failure) (HCC)   . COPD (chronic obstructive pulmonary disease) (HCC)   . Diabetes mellitus without complication (HCC)   . Hypertension   . Lymphedema   . Splenic lesion    History reviewed. No pertinent surgical history. History reviewed. No pertinent family history. Social History   Tobacco Use  . Smoking status: Current Every Day Smoker    Packs/day: 0.25    Types: Cigarettes  . Smokeless tobacco: Never Used  . Tobacco comment: 1/2 [name]/day 05/30/20  Substance Use Topics  . Alcohol use: Not Currently   No Known Allergies Prior to Admission medications   Medication Sig Start Date End Date Taking? Authorizing Provider  albuterol  (VENTOLIN HFA) 108 (90 Base) MCG/ACT inhaler Inhale 2 puffs into the lungs every 6 (six) hours as needed for wheezing or shortness of breath. 06/26/20  Yes Iloabachie, Chioma E, NP  Fluticasone-Salmeterol (ADVAIR) 250-50 MCG/DOSE AEPB Inhale 1 puff into the lungs 2 (two) times daily. 06/26/20  Yes Iloabachie, Chioma E, NP  gabapentin (NEURONTIN) 100 MG capsule Take 2 capsules (200 mg total) by mouth at bedtime. 06/26/20  Yes Iloabachie, Chioma E, NP  hydrochlorothiazide (HYDRODIURIL) 12.5 MG tablet Take 1 tablet (12.5 mg total) by mouth daily. 06/26/20  Yes Iloabachie, Chioma E, NP  meloxicam (MOBIC) 7.5 MG tablet Take 7.5 mg by mouth daily.   Yes [provider]  metFORMIN (GLUCOPHAGE) 500 MG tablet TAKE ONE TABLET BY MOUTH EVERY DAY WITH BREAKFAST 05/21/20  Yes Iloabachie, Chioma E, NP  Tiotropium Bromide Monohydrate (SPIRIVA RESPIMAT) 2.5 MCG/ACT AERS Inhale 2 puffs into the lungs daily. 05/10/20  Yes Kalman Shan, MD    Review of Systems  Constitutional: Positive for fatigue (easily). Negative for appetite change.  HENT: Negative for congestion, postnasal drip and sore throat.   Eyes: Negative.   Respiratory: Positive for cough, shortness of breath (easily) and wheezing.   Cardiovascular: Positive for palpitations and leg swelling (improving). Negative for chest pain.  Gastrointestinal: Negative for abdominal distention and abdominal pain.  Endocrine: Negative.   Genitourinary: Negative.   Musculoskeletal: Negative for back pain and neck pain.  Skin: Negative.   Allergic/Immunologic: Negative.   Neurological:  Positive for light-headedness. Negative for dizziness.  Hematological: Negative for adenopathy. Does not bruise/bleed easily.  Psychiatric/Behavioral: Negative for dysphoric mood and sleep disturbance (sleeping on 3 pillows). The patient is not nervous/anxious.    Vitals:   08/01/20 1125  BP: 135/65  Pulse: 74  Resp: 20  SpO2: 96%  Weight: (!) 303 lb 6 oz (137.6 kg)   Height: 5\' 1"  (1.549 m)   Wt Readings from Last 3 Encounters:  08/01/20 (!) 303 lb 6 oz (137.6 kg)  06/26/20 (!) 336 lb 12.8 oz (152.8 kg)  05/30/20 (!) 336 lb (152.4 kg)   Lab Results  Component Value Date   CREATININE 1.01 (H) 03/07/2020   CREATININE 0.81 02/21/2020   CREATININE 0.69 02/22/2017    Physical Exam Vitals and nursing note reviewed.  Constitutional:      Appearance: She is well-developed.  HENT:     Head: Normocephalic and atraumatic.  Neck:     Vascular: No JVD.  Cardiovascular:     Rate and Rhythm: Normal rate and regular rhythm.  Pulmonary:     Effort: Pulmonary effort is normal. No respiratory distress.     Breath sounds: Examination of the right-upper field reveals wheezing. Examination of the left-upper field reveals wheezing. Examination of the right-lower field reveals wheezing and rhonchi. Examination of the left-lower field reveals wheezing and rhonchi. Wheezing and rhonchi present. No rales.  Abdominal:     Palpations: Abdomen is soft.     Tenderness: There is no abdominal tenderness.  Musculoskeletal:     Cervical back: Neck supple.     Right lower leg: No tenderness. No edema.     Left lower leg: No tenderness. No edema.  Skin:    General: Skin is warm and dry.  Neurological:     General: No focal deficit present.     Mental Status: She is alert and oriented to person, place, and time.  Psychiatric:        Mood and Affect: Mood normal.        Behavior: Behavior normal.     Assessment & Plan:  1: Chronic heart failure with preserved ejection fraction with structural changes (LVH/ LAE)- - NYHA class III - euvolemic today - not weighing consistently as she has analog scales and questions accuracy; set of digital scales given to patient and she was instructed to weigh every morning, write the weight down and call for an overnight weight gain of > 2 pounds or a weekly weight gain of > 5 pounds - not adding salt but does admit to "loving"  oodles and noodles; has not looked at sodium content but knows they are high; pulled up food label for this product while patient in the room and explained that it has roughly 900mg  sodium. Reviewed the importance of keeping daily sodium intake to around 2000mg / day and written dietary information was given to her about this - says that she doesn't drink much fluids but does eat ice "all day"; works at [employer] and takes home a bag of ice after every shift; no idea how much fluid she is taking in because of the ice eating - BNP 03/07/20 was 290.7 - has not gotten her covid vaccine; questions answered and information provided - has received her flu vaccine for this season  2: HTN- - BP looks good today - saw PCP (Iloabachie) 06/26/20 - BMP 03/07/20 reviewed and showed sodium 137, potassium 4.0, creatinine 1.01 and GFR >60  3: DM- - A1c 06/26/20 was 6.1% -  glucose at home was 124  4: COPD- - saw pulmonology Marchelle Gearing) 05/10/20 - smoking 2-3 cigarettes daily; had quit once before for 10 years  - complete cessation discussed for 3 minutes with her  5: Splenic lesion- - saw GI Maximino Greenland) 05/30/20 - abdominal MRI completed 06/13/20  6: Lymphedema- - saw vascular (Schnier) 04/08/20  - normally wears compression socks daily but didn't wear them today   Patient did not bring her medications nor a list. Each medication was verbally reviewed with the patient and she was encouraged to bring the bottles to every visit to confirm accuracy of list.  Return in 2 months or sooner for any questions/problems before then.

## 2020-08-07 ENCOUNTER — Telehealth: Payer: Self-pay | Admitting: Pharmacist

## 2020-08-07 NOTE — Telephone Encounter (Signed)
08/07/2020 11:40:34 AM - Advair pending  -- Rhetta Mura - Wednesday, August 07, 2020 11:39 AM --I have received the signed script for Advair 250/50--holding for patient to return her portion--mailed to patient 08/05/2020.

## 2020-08-13 ENCOUNTER — Telehealth: Payer: Self-pay | Admitting: Internal Medicine

## 2020-08-14 NOTE — Telephone Encounter (Signed)
Patient has an appt with Dr. Marchelle Gearing on 08/29/20 @ 2:30pm and needs to have PFT before her return appt. I have spoke with Mrs. Caul and she is aware there are no more PFT appts for January.  We have made notes to try and get her PFT scheduled on 2/1 or 2/2 before she sees Dr. Marchelle Gearing on 02/03

## 2020-08-15 ENCOUNTER — Ambulatory Visit: Payer: Self-pay

## 2020-08-23 NOTE — Telephone Encounter (Signed)
We had to CXL the appt on 08/29/20 with Dr. Marchelle Gearing because we couldn't get the PFT scheduled before that appt.  [name] is aware the Covid Test is scheduled on 09/06/2020 and PFT scheduled on 09/09/2020 @ 1:30pm ROV with Elisha Headland, NP on 09/12/2020

## 2020-08-29 ENCOUNTER — Ambulatory Visit: Payer: Self-pay | Admitting: Internal Medicine

## 2020-08-29 ENCOUNTER — Ambulatory Visit: Payer: Self-pay

## 2020-09-03 ENCOUNTER — Telehealth: Payer: Self-pay | Admitting: Internal Medicine

## 2020-09-03 NOTE — Telephone Encounter (Signed)
Lm to remind patient of covid test prior to PFT.  [facility]

## 2020-09-04 NOTE — Telephone Encounter (Signed)
Lm x2 for patient.  Will close encounter per office protocol.   

## 2020-09-06 ENCOUNTER — Other Ambulatory Visit: Admission: RE | Admit: 2020-09-06 | Payer: Self-pay | Source: Ambulatory Visit

## 2020-09-06 NOTE — Telephone Encounter (Signed)
Received secure epic message from Dorathy Kinsman, RN. Patient did not show for covid test today.  PFT will need to be canceled.   Lm for patient to make her aware.   Synetta Fail, please reschedule PFT. Thanks

## 2020-09-09 ENCOUNTER — Ambulatory Visit: Payer: Self-pay

## 2020-09-10 ENCOUNTER — Telehealth: Payer: Self-pay | Admitting: Gerontology

## 2020-09-10 NOTE — Telephone Encounter (Signed)
LVM for pt to call back and schedule an appointment.

## 2020-09-12 ENCOUNTER — Ambulatory Visit: Payer: Self-pay | Admitting: Pulmonary Disease

## 2020-09-30 ENCOUNTER — Ambulatory Visit: Payer: Self-pay | Admitting: Family

## 2020-09-30 ENCOUNTER — Telehealth: Payer: Self-pay | Admitting: Family

## 2020-09-30 NOTE — Telephone Encounter (Signed)
Patient did not show for her [facility] appointment on 09/30/20. Will attempt to reschedule.  

## 2020-09-30 NOTE — Telephone Encounter (Signed)
LVM with patient regarding her no show CHF CLinic appointment and asked patient to call back to reschedule.   [name], NT

## 2020-10-15 ENCOUNTER — Telehealth: Payer: Self-pay | Admitting: Pharmacy Technician

## 2020-10-15 NOTE — Telephone Encounter (Signed)
Patient failed to provide 2022 proof of income.  No additional medication assistance will be provided by [facility] without the required proof of income documentation.  Patient notified by letter.  Sherilyn Dacosta Care Manager [facility]   [address] [place], [place]  [address]    October 14, 2020    [name] [address] [place], [place]  [address]  Dear [name]:  This is to inform you that you are no longer eligible to receive medication assistance at [facility].  The reason(s) are:    _____Your total gross monthly household income exceeds 250% of the Federal Poverty Level.   _____Tangible assets (savings, checking, stocks/bonds, pension, retirement, etc.) exceeds our limit  _____You are eligible to receive benefits from [facility], [facility] or HIV Medication              Assistance Program _____You are eligible to receive benefits from a Medicare Part "D" plan _____You have prescription insurance  _____You are not an [facility] resident __X__Failure to provide all requested proof of income information for 2022.    Medication assistance will resume once all requested financial information has been returned to our clinic.  If you have questions, please contact our clinic at [phone].    Thank you,  [facility]

## 2020-10-28 ENCOUNTER — Other Ambulatory Visit: Payer: Self-pay

## 2020-10-30 ENCOUNTER — Other Ambulatory Visit: Payer: Self-pay

## 2020-10-31 ENCOUNTER — Other Ambulatory Visit: Payer: Self-pay

## 2020-11-01 ENCOUNTER — Other Ambulatory Visit: Payer: Self-pay

## 2020-11-04 ENCOUNTER — Other Ambulatory Visit: Payer: Self-pay

## 2020-11-06 ENCOUNTER — Other Ambulatory Visit: Payer: Self-pay

## 2020-11-08 ENCOUNTER — Other Ambulatory Visit: Payer: Self-pay

## 2020-11-13 ENCOUNTER — Other Ambulatory Visit: Payer: Self-pay

## 2020-11-27 ENCOUNTER — Other Ambulatory Visit: Payer: Self-pay

## 2020-11-29 ENCOUNTER — Other Ambulatory Visit: Payer: Self-pay

## 2020-11-29 MED FILL — Metformin HCl Tab 500 MG: ORAL | 30 days supply | Qty: 30 | Fill #0 | Status: AC

## 2020-12-11 ENCOUNTER — Other Ambulatory Visit: Payer: Self-pay

## 2020-12-13 ENCOUNTER — Other Ambulatory Visit: Payer: Self-pay

## 2020-12-16 ENCOUNTER — Other Ambulatory Visit: Payer: Self-pay

## 2020-12-19 ENCOUNTER — Other Ambulatory Visit: Payer: Self-pay

## 2021-01-22 ENCOUNTER — Other Ambulatory Visit: Payer: Self-pay

## 2021-02-11 ENCOUNTER — Other Ambulatory Visit: Payer: Self-pay

## 2021-02-13 ENCOUNTER — Other Ambulatory Visit: Payer: Self-pay

## 2021-02-28 ENCOUNTER — Other Ambulatory Visit: Payer: Self-pay

## 2021-04-09 ENCOUNTER — Other Ambulatory Visit: Payer: Self-pay

## 2021-04-23 ENCOUNTER — Other Ambulatory Visit: Payer: Self-pay

## 2021-05-13 ENCOUNTER — Institutional Professional Consult (permissible substitution): Payer: Self-pay | Admitting: Gerontology

## 2021-06-05 ENCOUNTER — Other Ambulatory Visit: Payer: Self-pay

## 2022-02-28 IMAGING — DX DG KNEE COMPLETE 4+V*R*
4 series · 4 of 4 positions shown · non-contrast
Comparison: 07/01/2018

CLINICAL DATA: Worsening chronic right knee pain. History of
arthritis.

EXAM:
RIGHT KNEE - COMPLETE 4+ VIEW

[knee ap]
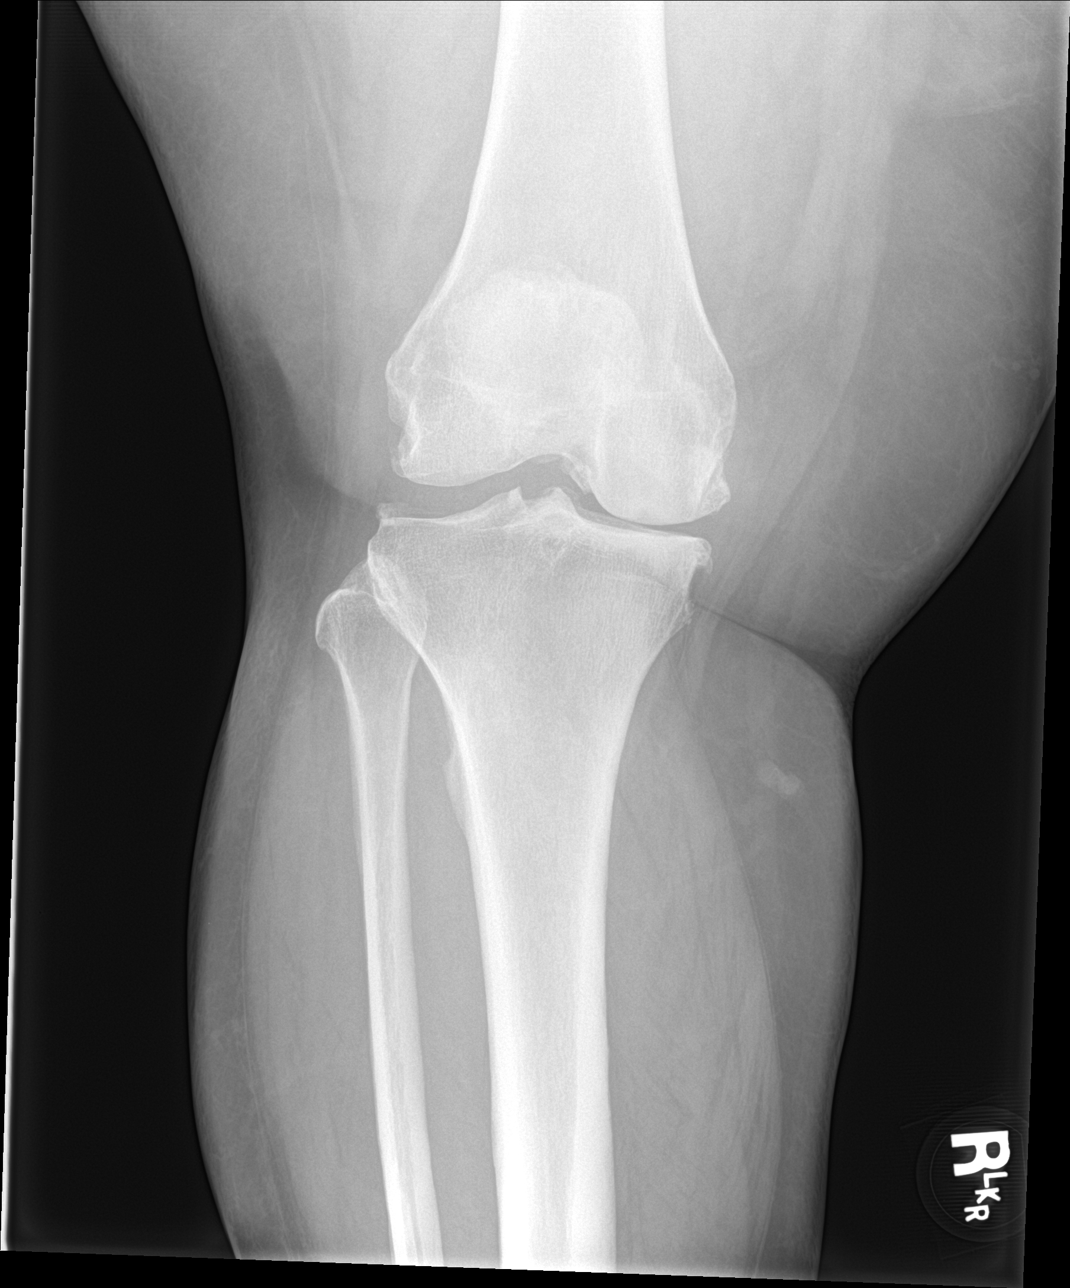

[knee lat]
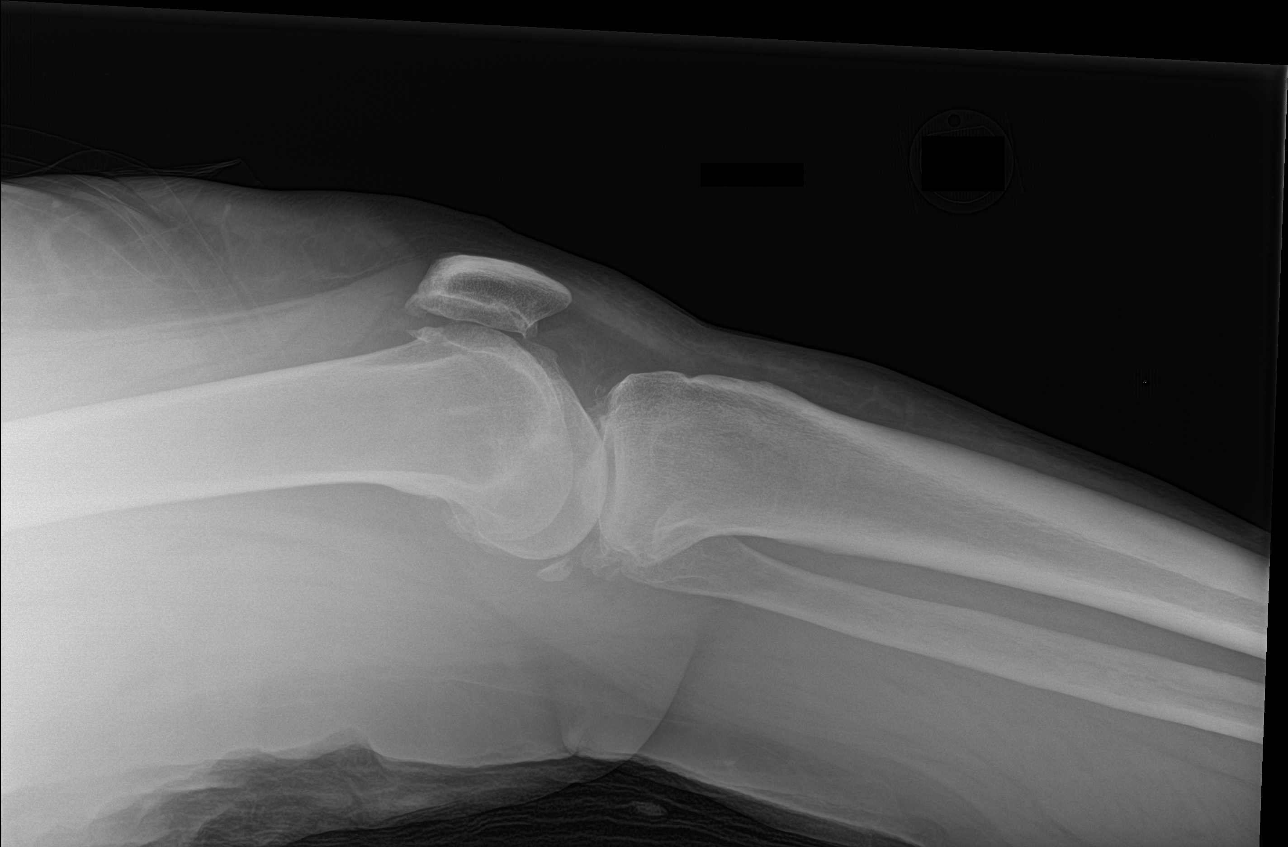

[knee obl (1 of 2)]
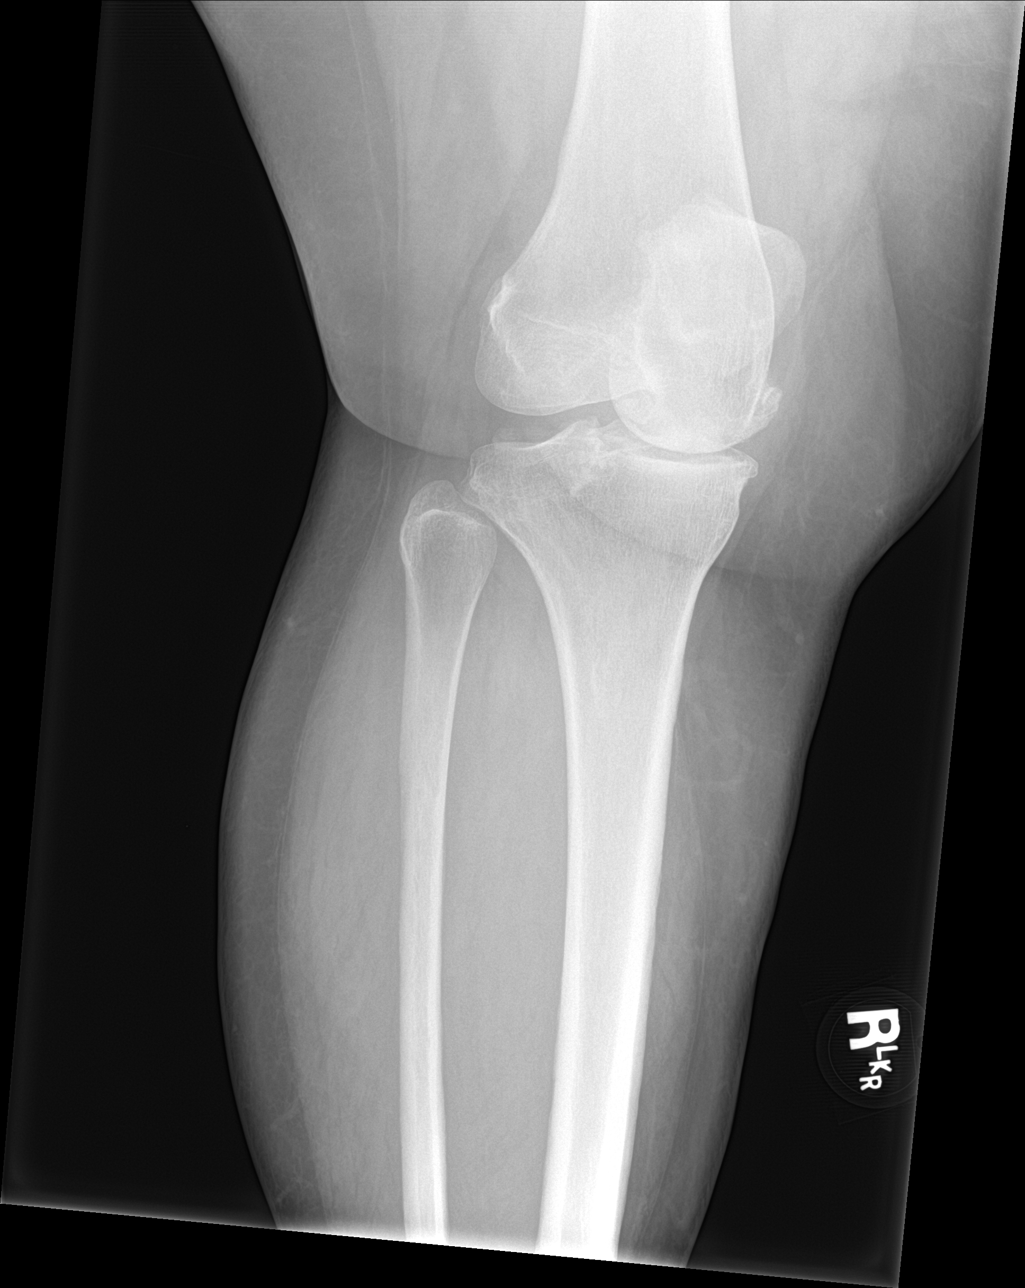

[knee obl (2 of 2)]
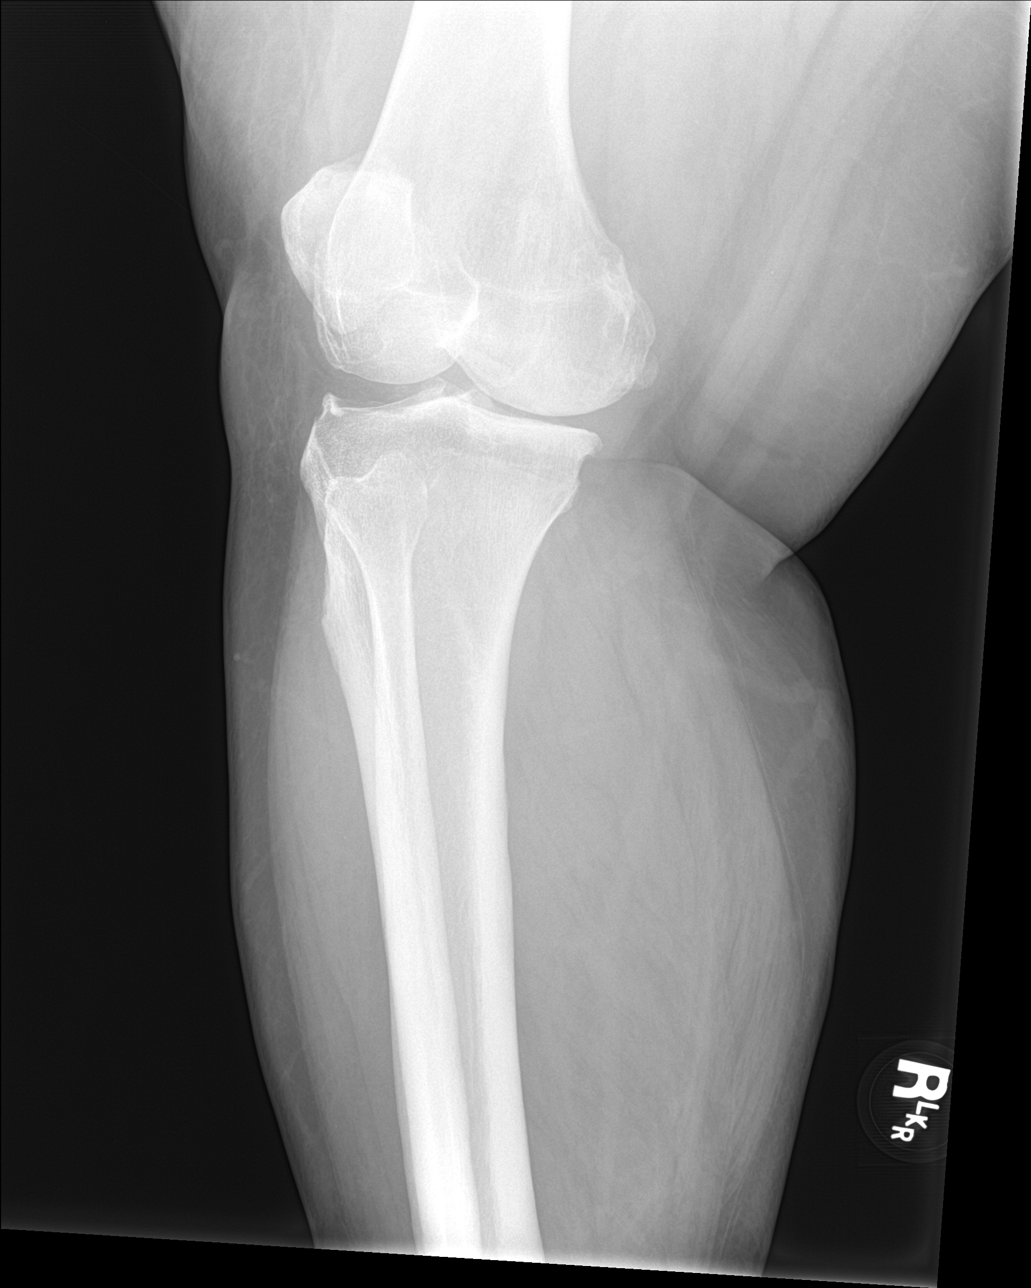

[4 of 4 positions shown; findings below may reference images not displayed]

FINDINGS: No acute fracture, dislocation, or knee joint effusion is
identified. Moderate to severe medial compartment joint space
narrowing is unchanged as is moderate tricompartmental marginal
osteophytosis. The soft tissues are unremarkable.
IMPRESSION: 1. No acute osseous abnormality.
2. Unchanged tricompartmental osteoarthrosis, greatest medially.

## 2022-05-25 ENCOUNTER — Encounter (INDEPENDENT_AMBULATORY_CARE_PROVIDER_SITE_OTHER): Payer: Self-pay

## 2022-05-30 IMAGING — US US EXTREM LOW VENOUS*R*
1 series · 13 of 24 positions shown · non-contrast
Comparison: None.

CLINICAL DATA: Right lower extremity edema

EXAM:
RIGHT LOWER EXTREMITY VENOUS DUPLEX ULTRASOUND
TECHNIQUE: Gray-scale sonography with graded compression, as well as color
Doppler and duplex ultrasound were performed to evaluate the right
lower extremity deep venous system from the level of the common
femoral vein and including the common femoral, femoral, profunda
femoral, popliteal and calf veins including the posterior tibial,
peroneal and gastrocnemius veins when visible. The superficial great
saphenous vein was also interrogated. Spectral Doppler was utilized
to evaluate flow at rest and with distal augmentation maneuvers in
the common femoral, femoral and popliteal veins.

[Series 1: us venous img lower uni right (dvt) · portal-venous · 13 of 35 slices shown]
[im 1/35]
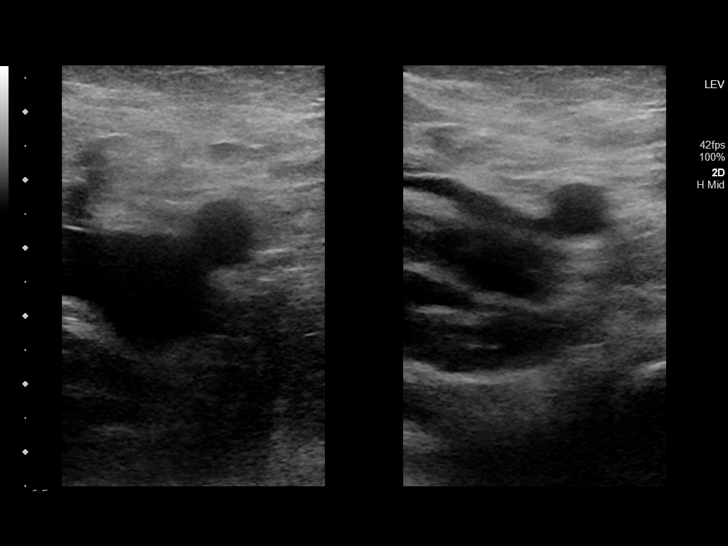
[im 3/35]
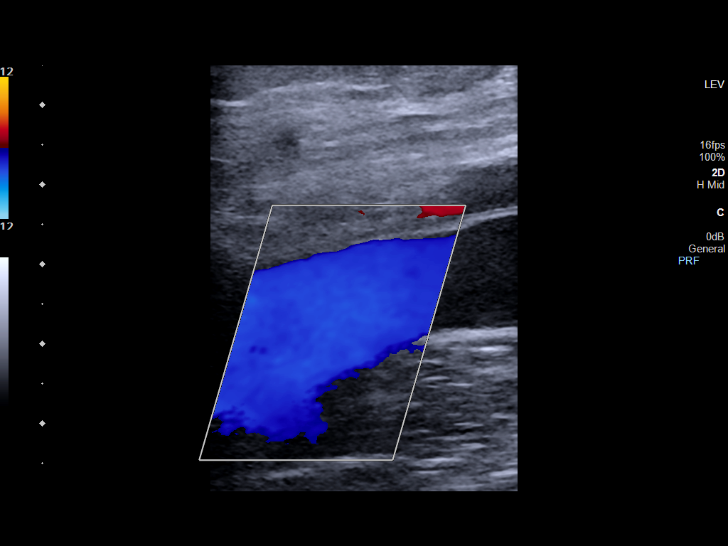
[im 6/35]
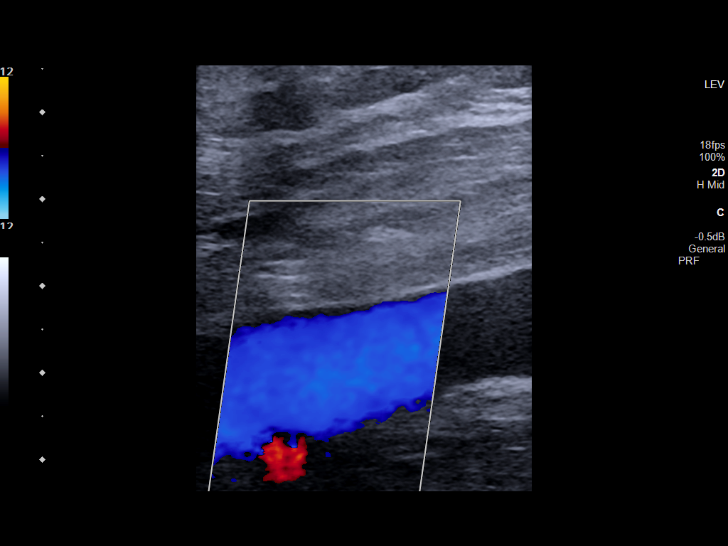
[im 9/35]
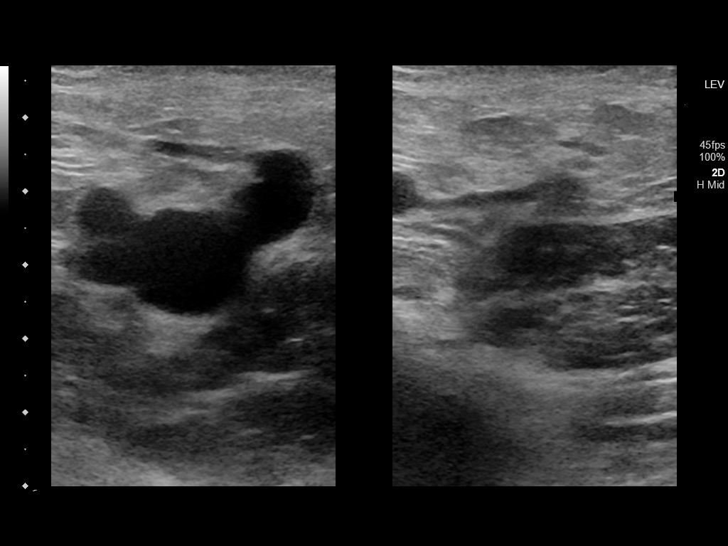
[im 12/35]
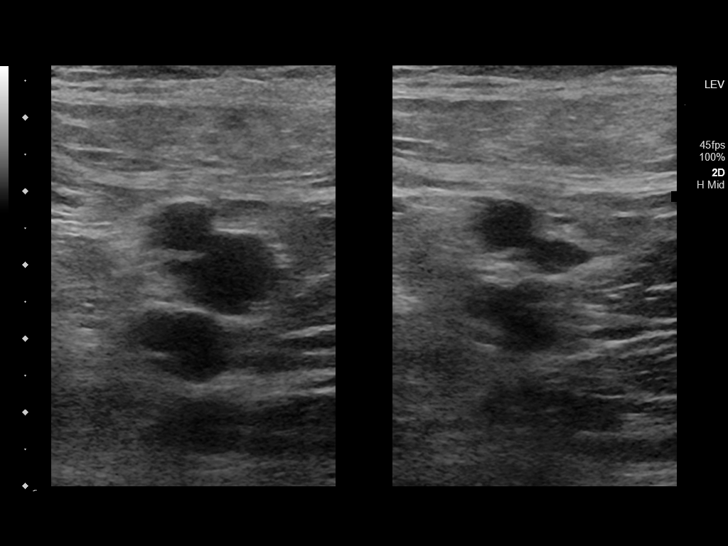
[im 15/35]
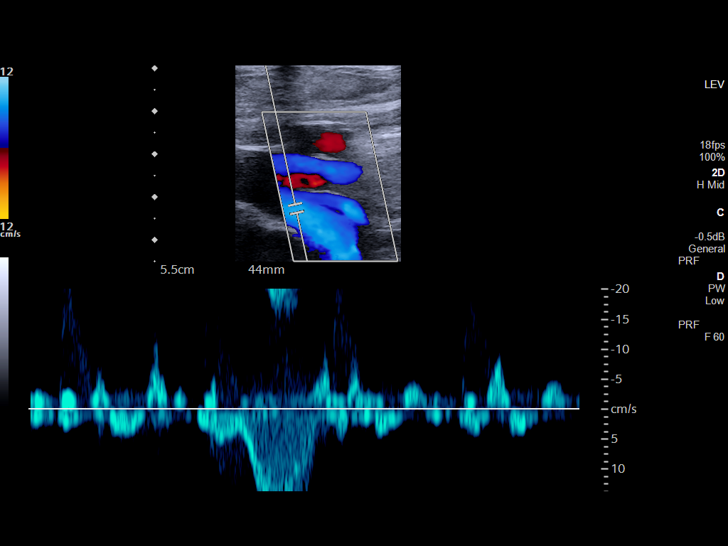
[im 18/35]
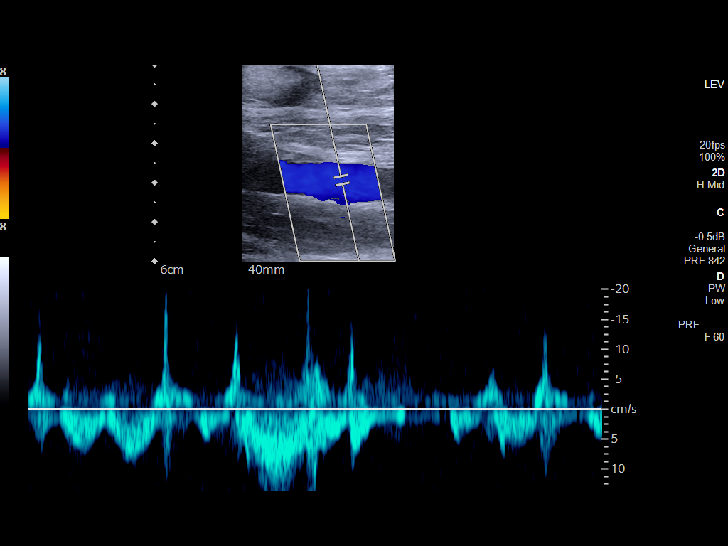
[im 20/35]
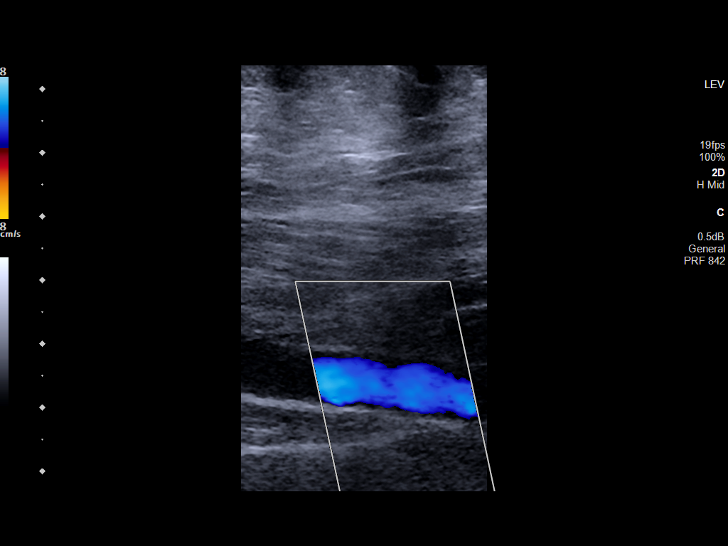
[im 23/35]
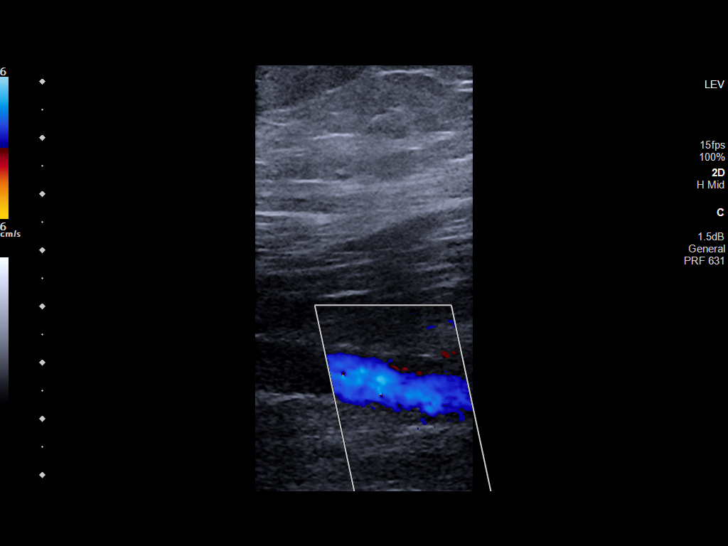
[im 26/35]
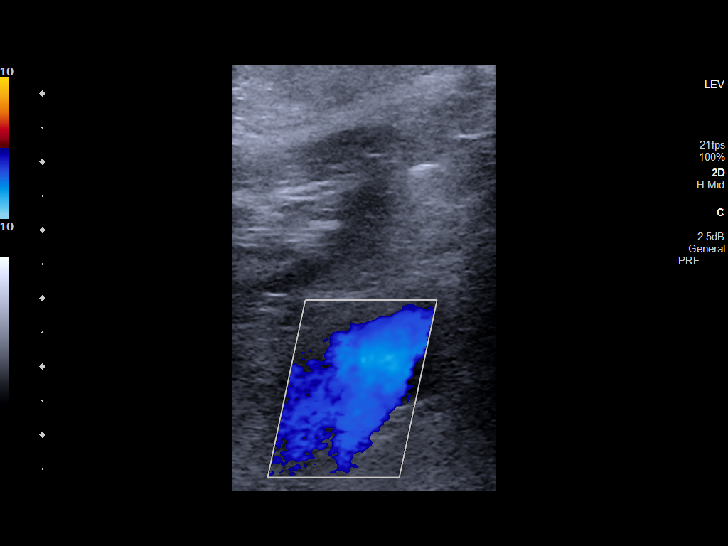
[im 29/35]
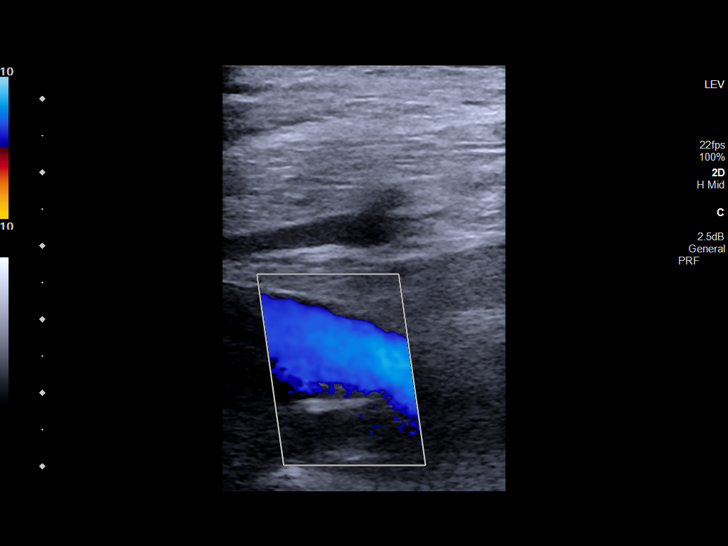
[im 32/35]
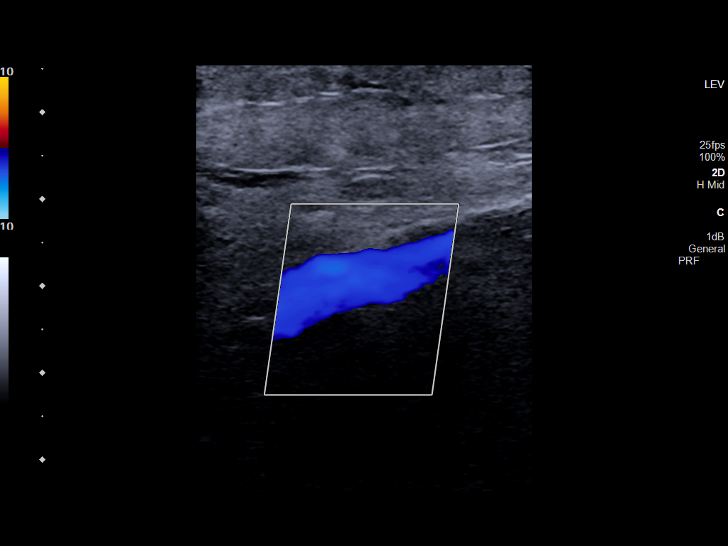
[im 35/35]
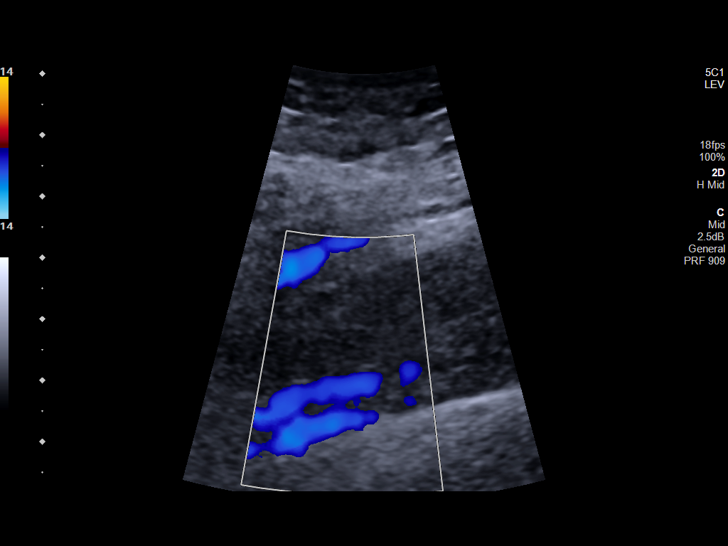

[13 of 24 positions shown; findings below may reference images not displayed]

FINDINGS: Contralateral Common Femoral Vein: Respiratory phasicity is normal
and symmetric with the symptomatic side. No evidence of thrombus.
Normal compressibility.

Common Femoral Vein: No evidence of thrombus. Normal
compressibility, respiratory phasicity and response to augmentation.

Saphenofemoral Junction: No evidence of thrombus. Normal
compressibility and flow on color Doppler imaging.

Profunda Femoral Vein: No evidence of thrombus. Normal
compressibility and flow on color Doppler imaging.

Femoral Vein: No evidence of thrombus. Normal compressibility,
respiratory phasicity and response to augmentation.

Popliteal Vein: No evidence of thrombus. Normal compressibility,
respiratory phasicity and response to augmentation.

Calf Veins: No evidence of thrombus. Normal compressibility and flow
on color Doppler imaging.

Superficial Great Saphenous Vein: No evidence of thrombus. Normal
compressibility.

Venous Reflux:  None.

Other Findings:  None.
IMPRESSION: No evidence of deep venous thrombosis in the right lower extremity.
Left common femoral vein also patent.

## 2022-09-16 DIAGNOSIS — R600 Localized edema: Secondary | ICD-10-CM | POA: Diagnosis not present

## 2022-09-16 DIAGNOSIS — R0902 Hypoxemia: Secondary | ICD-10-CM | POA: Diagnosis not present

## 2022-09-16 DIAGNOSIS — L539 Erythematous condition, unspecified: Secondary | ICD-10-CM | POA: Diagnosis not present

## 2022-09-16 DIAGNOSIS — J449 Chronic obstructive pulmonary disease, unspecified: Secondary | ICD-10-CM | POA: Diagnosis not present

## 2022-09-16 DIAGNOSIS — R0600 Dyspnea, unspecified: Secondary | ICD-10-CM | POA: Diagnosis not present

## 2022-09-17 DIAGNOSIS — I272 Pulmonary hypertension, unspecified: Secondary | ICD-10-CM | POA: Diagnosis not present

## 2022-09-17 DIAGNOSIS — I509 Heart failure, unspecified: Secondary | ICD-10-CM | POA: Diagnosis not present

## 2023-05-31 ENCOUNTER — Ambulatory Visit: Payer: Medicaid Other | Admitting: Physician Assistant

## 2023-06-02 ENCOUNTER — Ambulatory Visit: Payer: Medicaid Other | Admitting: Physician Assistant

## 2023-06-07 ENCOUNTER — Encounter: Payer: 59 | Attending: Physician Assistant | Admitting: Physician Assistant

## 2023-06-07 ENCOUNTER — Ambulatory Visit: Payer: Medicaid Other | Admitting: Physician Assistant

## 2023-06-07 DIAGNOSIS — L03115 Cellulitis of right lower limb: Secondary | ICD-10-CM | POA: Insufficient documentation

## 2023-06-07 DIAGNOSIS — L97812 Non-pressure chronic ulcer of other part of right lower leg with fat layer exposed: Secondary | ICD-10-CM | POA: Diagnosis not present

## 2023-06-07 DIAGNOSIS — R7303 Prediabetes: Secondary | ICD-10-CM | POA: Insufficient documentation

## 2023-06-07 DIAGNOSIS — J4489 Other specified chronic obstructive pulmonary disease: Secondary | ICD-10-CM | POA: Diagnosis not present

## 2023-06-07 DIAGNOSIS — I87331 Chronic venous hypertension (idiopathic) with ulcer and inflammation of right lower extremity: Secondary | ICD-10-CM | POA: Diagnosis present

## 2023-06-07 DIAGNOSIS — I5042 Chronic combined systolic (congestive) and diastolic (congestive) heart failure: Secondary | ICD-10-CM | POA: Insufficient documentation

## 2023-06-07 DIAGNOSIS — F17218 Nicotine dependence, cigarettes, with other nicotine-induced disorders: Secondary | ICD-10-CM | POA: Diagnosis not present

## 2023-06-07 NOTE — Progress Notes (Signed)
ADAORA, VERMEERSCH ([id number]) [id number] Page 1 of 9 Visit Report for 06/07/2023 Chief Complaint Document Details Patient Name: Date of Service: [name], [name] 06/07/2023 2:00 PM Medical Record Number: [id number] Patient Account Number: [id number] Date of Birth/Sex: Treating RN: [birth year] (52 y.o. Ginette Pitman Primary Care Provider: Eulogio Bear Other Clinician: Referring Provider: Treating Provider/Extender: Fredrik Rigger, Chioma Weeks in Treatment: 0 Information Obtained from: Patient Chief Complaint Right leg venous leg ulcer Electronic Signature(s) Signed: 06/07/2023 2:51:41 PM By: Allen Derry PA-C Entered By: Allen Derry on 06/07/2023 11:51:41 -------------------------------------------------------------------------------- HPI Details Patient Name: Date of Service: [name], [name] 06/07/2023 2:00 PM Medical Record Number: [id number] Patient Account Number: [id number] Date of Birth/Sex: Treating RN: 10-Dec-1970 (52 y.o. Ginette Pitman Primary Care Provider: Eulogio Bear Other Clinician: Referring Provider: Treating Provider/Extender: Fredrik Rigger, Chioma Weeks in Treatment: 0 History of Present Illness HPI Description: Notes from Hospital admission at [facility]: Cellulitis  Venous stasis wound: Presented with fairly significant lower extremity edema and weeping of a wound on her right lower extremity. This has been present for 6 to 8 weeks per the patient. A CT on admission demonstrated cellulitis without abscess or osteomyelitis. The superficial wound culture showed Pseudomonas. There were no risk factors otherwise for Pseudomonas. Overall, this was consistent with venous stasis edema and a mild superimposed cellulitis. She was treated initially with vancomycin for 2 days and then topical mupirocin added. Given the superficial culture showing Pseudomonas, started on IV ciprofloxacin as well as oral cephalexin  for MSSA for treatment of her cellulitis. Vancomycin was discontinued. Transitioned from IV to oral ciprofloxacin on the day of discharge, and continued both ciprofloxacin and cephalexin for a total 7-day course on discharge. Blood cultures negative. Pain was managed with Tylenol and oxycodone as needed, which was also prescribed short term on discharge. Dressing changes performed frequently throughout the day due to significant weeping of her wound, with continuation of dressing changes including Silvadene on discharge. Patient has an appointment with the wound care clinic on November 11 at 1:45 PM Prediabetes (A1c 6.4%). Started on metformin 500mg  XL outpatient. Admission to wound care center [place], [place]: 06-07-2023 upon evaluation today patient appears to be doing somewhat poorly in regard to her right leg. She tells me that she has been having some issues OESTERREICH, Karigan ([id number]) [id number] Page 2 of 9 here with infection. She does tell me that overall the infection has been treated in the hospital initially she was noted to have Pseudomonas and some mild staph. Subsequently she was put on Cipro and Keflex. With that being said she still has quite a bit of drainage as well as erythema she is nearing the completion of her antibiotics they gave oral they were given a total of 7 days of each the Keflex and the Cipro I do not think 7 days is gena be enough I discussed that with her today. With that being said I do believe after reviewing the notes above from [facility] but the patient is on the right track I just think we need to Cipro for a bit longer. Patient does have a history of congestive heart failure, nicotine dependence, COPD, prediabetes, and again chronic venous hypertension. Electronic Signature(s) Signed: 06/07/2023 3:13:35 PM By: Allen Derry PA-C Entered By: Allen Derry on 06/07/2023  12:13:35 -------------------------------------------------------------------------------- Physical Exam Details Patient Name: Date of Service: [name], [name] 06/07/2023 2:00 PM Medical Record Number: [id number] Patient Account Number: [id number] Date of Birth/Sex: Treating RN: 1971/02/05 (52 y.o. Ginette Pitman  Primary Care Provider: Eulogio Bear Other Clinician: Referring Provider: Treating Provider/Extender: Fredrik Rigger, Chioma Weeks in Treatment: 0 Constitutional sitting or standing blood pressure is within target range for patient.. pulse regular and within target range for patient.Marland Kitchen respirations regular, non-labored and within target range for patient.Marland Kitchen temperature within target range for patient.. Obese and well-hydrated in no acute distress. Eyes conjunctiva clear no eyelid edema noted. pupils equal round and reactive to light and accommodation. Ears, Nose, Mouth, and Throat no gross abnormality of ear auricles or external auditory canals. normal hearing noted during conversation. mucus membranes moist. Respiratory normal breathing without difficulty. Cardiovascular 1+ dorsalis pedis/posterior tibialis pulses. 2+ pitting edema of the bilateral lower extremities. Musculoskeletal normal gait and posture. no significant deformity or arthritic changes, no loss or range of motion, no clubbing. Psychiatric this patient is able to make decisions and demonstrates good insight into disease process. Alert and Oriented x 3. pleasant and cooperative. Notes Upon evaluation patient's wound bed actually showed signs of pretty good granulation epithelization at this point. Fortunately I do not see any signs of active infection at this time which is great news and systemically though locally there is still some signs of cellulitis and erythema. I do believe that the patient would benefit from a continuation of therapy with the oral antibiotics I would recommend the Cipro be  continued. Electronic Signature(s) Signed: 06/07/2023 3:14:08 PM By: Allen Derry PA-C Entered By: Allen Derry on 06/07/2023 12:14:07 Daryel Gerald ([id number]) [id number] Page 3 of 9 -------------------------------------------------------------------------------- Physician Orders Details Patient Name: Date of Service: [name], [name] 06/07/2023 2:00 PM Medical Record Number: [id number] Patient Account Number: [id number] Date of Birth/Sex: Treating RN: Dec 09, 1970 (52 y.o. Ginette Pitman Primary Care Provider: Eulogio Bear Other Clinician: Referring Provider: Treating Provider/Extender: Fredrik Rigger, Chioma Weeks in Treatment: 0 Verbal / Phone Orders: No Diagnosis Coding ICD-10 Coding Code Description 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 lower extremity L03.115 Cellulitis of right lower limb L97.812 Non-pressure chronic ulcer of other part of right lower leg with fat layer exposed R73.03 Prediabetes J44.89 Other specified chronic obstructive pulmonary disease F17.218 Nicotine dependence, cigarettes, with other nicotine-induced disorders I50.42 Chronic combined systolic (congestive) and diastolic (congestive) heart failure Follow-up Appointments Return Appointment in 1 week. Nurse Visit as needed Edema Control - Orders / Instructions UrgoK2 LITE Wound Treatment Wound #1 - Lower Leg Wound Laterality: Right, Lateral Cleanser: Soap and Water 1 x Per Week/30 Days Discharge Instructions: Gently cleanse wound with antibacterial soap, rinse and pat dry prior to dressing wounds Cleanser: Vashe 5.8 (oz) 1 x Per Week/30 Days Discharge Instructions: Use vashe 5.8 (oz) as directed Prim Dressing: Silvercel Small 2x2 (in/in) 1 x Per Week/30 Days ary Discharge Instructions: Apply Silvercel Small 2x2 (in/in) as instructed Secondary Dressing: Zetuvit Plus 4x8 (in/in) 1 x Per Week/30  Days Compression Wrap: Urgo K2 Lite, two layer compression system, regular 1 x Per Week/30 Days Patient Medications llergies: No Known Allergies A Notifications Medication Indication Start End 06/07/2023 Cipro DOSE 1 - oral 500 mg tablet - 1 tablet oral twice a day x 14 days Electronic Signature(s) Signed: 06/07/2023 3:42:10 PM By: Allen Derry PA-C Previous Signature: 06/07/2023 3:38:06 PM Version By: Midge Aver MSN RN CNS WTA Entered By: Allen Derry on 06/07/2023 12:42:10 Daryel Gerald ([id number]) [id number] Page 4 of 9 -------------------------------------------------------------------------------- Problem List Details Patient Name: Date of Service: [name], Jaedynn 06/07/2023 2:00 PM Medical Record Number: [id number] Patient Account Number: [id number] Date of Birth/Sex: Treating  RN: 03-15-1971 (52 y.o. Ginette Pitman Primary Care Provider: Eulogio Bear Other Clinician: Referring Provider: Treating Provider/Extender: Fredrik Rigger, Chioma Weeks in Treatment: 0 Active Problems ICD-10 Encounter Code Description Active Date MDM Diagnosis 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 06/07/2023 No Yes lower extremity L03.115 Cellulitis of right lower limb 06/07/2023 No Yes L97.812 Non-pressure chronic ulcer of other part of right lower leg with fat layer 06/07/2023 No Yes exposed R73.03 Prediabetes 06/07/2023 No Yes J44.89 Other specified chronic obstructive pulmonary disease 06/07/2023 No Yes F17.218 Nicotine dependence, cigarettes, with other nicotine-induced disorders 06/07/2023 No Yes I50.42 Chronic combined systolic (congestive) and diastolic (congestive) heart failure 06/07/2023 No Yes Inactive Problems Resolved Problems Electronic Signature(s) Signed: 06/07/2023 2:57:09 PM By: Allen Derry PA-C Previous Signature: 06/07/2023 2:48:28 PM Version By: Allen Derry PA-C Entered By: Allen Derry on 06/07/2023 11:57:09 Daryel Gerald ([id number]) [id number] Page 5 of 9 -------------------------------------------------------------------------------- Progress Note Details Patient Name: Date of Service: [name], [name] 06/07/2023 2:00 PM Medical Record Number: [id number] Patient Account Number: [id number] Date of Birth/Sex: Treating RN: October 13, 1970 (52 y.o. Ginette Pitman Primary Care Provider: Eulogio Bear Other Clinician: Referring Provider: Treating Provider/Extender: Fredrik Rigger, Chioma Weeks in Treatment: 0 Subjective Chief Complaint Information obtained from Patient Right leg venous leg ulcer History of Present Illness (HPI) Notes from Hospital admission at [facility]: Cellulitis  Venous stasis wound: Presented with fairly significant lower extremity edema and weeping of a wound on her right lower extremity. This has been present for 6 to 8 weeks per the patient. A CT on admission demonstrated cellulitis without abscess or osteomyelitis. The superficial wound culture showed Pseudomonas. There were no risk factors otherwise for Pseudomonas. Overall, this was consistent with venous stasis edema and a mild superimposed cellulitis. She was treated initially with vancomycin for 2 days and then topical mupirocin added. Given the superficial culture showing Pseudomonas, started on IV ciprofloxacin as well as oral cephalexin for MSSA for treatment of her cellulitis. Vancomycin was discontinued. Transitioned from IV to oral ciprofloxacin on the day of discharge, and continued both ciprofloxacin and cephalexin for a total 7-day course on discharge. Blood cultures negative. Pain was managed with Tylenol and oxycodone as needed, which was also prescribed short term on discharge. Dressing changes performed frequently throughout the day due to significant weeping of her wound, with continuation of dressing changes including Silvadene on  discharge. Patient has an appointment with the wound care clinic on November 11 at 1:45 PM Prediabetes (A1c 6.4%). Started on metformin 500mg  XL outpatient. Admission to wound care center [place], [place]: 06-07-2023 upon evaluation today patient appears to be doing somewhat poorly in regard to her right leg. She tells me that she has been having some issues here with infection. She does tell me that overall the infection has been treated in the hospital initially she was noted to have Pseudomonas and some mild staph. Subsequently she was put on Cipro and Keflex. With that being said she still has quite a bit of drainage as well as erythema she is nearing the completion of her antibiotics they gave oral they were given a total of 7 days of each the Keflex and the Cipro I do not think 7 days is gena be enough I discussed that with her today. With that being said I do believe after reviewing the notes above from [facility] but the patient is on the right track I just think we need to Cipro for a bit longer. Patient  does have a history of congestive heart failure, nicotine dependence, COPD, prediabetes, and again chronic venous hypertension. Patient History Information obtained from Patient. Allergies No Known Allergies Social History Current every day smoker - 1 ppd, Marital Status - Single, Alcohol Use - Never, Drug Use - No History, Caffeine Use - Rarely. Medical History Respiratory Patient has history of Chronic Obstructive Pulmonary Disease (COPD), Sleep Apnea Cardiovascular Patient has history of Congestive Heart Failure Neurologic Patient has history of Neuropathy Medical A Surgical History Notes nd Endocrine prediabetic Review of Systems (ROS) Constitutional Symptoms (General Health) Denies complaints or symptoms of Fatigue, Fever, Chills, Marked Weight Change. Eyes Denies complaints or symptoms of Dry Eyes, Vision Changes, Glasses / Contacts. Ear/Nose/Mouth/Throat Denies  complaints or symptoms of Difficult clearing ears, Sinusitis. Hematologic/Lymphatic Denies complaints or symptoms of Bleeding / Clotting Disorders, Human Immunodeficiency Virus. Gastrointestinal BAUDELIA, PIERCEFIELD ([id number]) [id number] Page 6 of 9 Denies complaints or symptoms of Frequent diarrhea, Nausea, Vomiting. Genitourinary Denies complaints or symptoms of Kidney failure/ Dialysis, Incontinence/dribbling. Immunological Denies complaints or symptoms of Hives, Itching. Integumentary (Skin) Complains or has symptoms of Swelling. Denies complaints or symptoms of Wounds. Musculoskeletal Denies complaints or symptoms of Muscle Pain, Muscle Weakness. Psychiatric Denies complaints or symptoms of Anxiety, Claustrophobia. Objective Constitutional sitting or standing blood pressure is within target range for patient.. pulse regular and within target range for patient.Marland Kitchen respirations regular, non-labored and within target range for patient.Marland Kitchen temperature within target range for patient.. Obese and well-hydrated in no acute distress. Vitals Time Taken: 2:11 PM, Height: 61 in, Source: Stated, Weight: 330 lbs, Source: Stated, BMI: 62.3, Temperature: 97.7 F, Pulse: 57 bpm, Respiratory Rate: 18 breaths/min, Blood Pressure: 133/84 mmHg. Eyes conjunctiva clear no eyelid edema noted. pupils equal round and reactive to light and accommodation. Ears, Nose, Mouth, and Throat no gross abnormality of ear auricles or external auditory canals. normal hearing noted during conversation. mucus membranes moist. Respiratory normal breathing without difficulty. Cardiovascular 1+ dorsalis pedis/posterior tibialis pulses. 2+ pitting edema of the bilateral lower extremities. Musculoskeletal normal gait and posture. no significant deformity or arthritic changes, no loss or range of motion, no clubbing. Psychiatric this patient is able to make decisions and demonstrates good insight into  disease process. Alert and Oriented x 3. pleasant and cooperative. General Notes: Upon evaluation patient's wound bed actually showed signs of pretty good granulation epithelization at this point. Fortunately I do not see any signs of active infection at this time which is great news and systemically though locally there is still some signs of cellulitis and erythema. I do believe that the patient would benefit from a continuation of therapy with the oral antibiotics I would recommend the Cipro be continued. Integumentary (Hair, Skin) Wound #1 status is Open. Original cause of wound was Gradually Appeared. The date acquired was: 04/06/2023. The wound is located on the Right,Lateral Lower Leg. The wound measures 8cm length x 5cm width x 0.1cm depth; 31.416cm^2 area and 3.142cm^3 volume. There is Fat Layer (Subcutaneous Tissue) exposed. There is a medium amount of serous drainage noted. There is medium (34-66%) red, pink granulation within the wound bed. Assessment Active Problems ICD-10 Chronic venous hypertension (idiopathic) with ulcer and inflammation of right lower extremity Cellulitis of right lower limb Non-pressure chronic ulcer of other part of right lower leg with fat layer exposed Prediabetes Other specified chronic obstructive pulmonary disease Nicotine dependence, cigarettes, with other nicotine-induced disorders Chronic combined systolic (congestive) and diastolic (congestive) heart failure Procedures Wound #1 Pre-procedure diagnosis of Wound #1 is an  Atypical located on the Right,Lateral Lower Leg . There was a Three Layer Compression Therapy Procedure by Midge Aver, RN. Post procedure Diagnosis Wound #1: Same as Pre-Procedure ZOLLIE, FONSECA ([id number]) [id number] Page 7 of 9 Plan Follow-up Appointments: Return Appointment in 1 week. Nurse Visit as needed Edema Control - Orders / Instructions: UrgoK2 LITE The following medication(s) was  prescribed: Cipro oral 500 mg tablet 1 1 tablet oral twice a day x 14 days starting 06/07/2023 WOUND #1: - Lower Leg Wound Laterality: Right, Lateral Cleanser: Soap and Water 1 x Per Week/30 Days Discharge Instructions: Gently cleanse wound with antibacterial soap, rinse and pat dry prior to dressing wounds Cleanser: Vashe 5.8 (oz) 1 x Per Week/30 Days Discharge Instructions: Use vashe 5.8 (oz) as directed Prim Dressing: Silvercel Small 2x2 (in/in) 1 x Per Week/30 Days ary Discharge Instructions: Apply Silvercel Small 2x2 (in/in) as instructed Secondary Dressing: Zetuvit Plus 4x8 (in/in) 1 x Per Week/30 Days Com pression Wrap: Urgo K2 Lite, two layer compression system, regular 1 x Per Week/30 Days 1. Based on what I am seeing I do believe that the patient would benefit from a continuation of therapy with regard to the antibiotics. I discussed with her that the best things probably can to be to continue the Cipro and she is in agreement with that plan. 2. I am going to recommend as well that we should continue to monitor for any signs of infection or worsening. 3. I am going to recommend as well that we go ahead and get the patient started with a silver cell at this point and the following this we will use Zetuvit over top of and then Urgo K2 light compression wrap. I think this is gena be the best way to go currently. We will do a nurse visit to change this out in a couple of days or so in order to make sure is not slipping down and get a fresh dressing on. 4. We did discuss smoking cessation and again this is something that I think she needs to be very cognizant of. I would recommend that she try to quit as quickly as possible also reviewed the record where she was given Wellbutrin as well as nicotine patches. I think both of these can be beneficial to help with quitting. Time in discussion was around 3 to 4 minutes today. We will see patient back for reevaluation in 1 week here in the clinic.  If anything worsens or changes patient will contact our office for additional recommendations. Electronic Signature(s) Signed: 06/07/2023 3:42:38 PM By: Allen Derry PA-C Previous Signature: 06/07/2023 3:15:59 PM Version By: Allen Derry PA-C Entered By: Allen Derry on 06/07/2023 12:42:38 -------------------------------------------------------------------------------- ROS/PFSH Details Patient Name: Date of Service: [name], [name] 06/07/2023 2:00 PM Medical Record Number: [id number] Patient Account Number: [id number] Date of Birth/Sex: Treating RN: 1970-08-25 (52 y.o. Ginette Pitman Primary Care Provider: Eulogio Bear Other Clinician: Referring Provider: Treating Provider/Extender: Fredrik Rigger, Chioma Weeks in Treatment: 0 Information Obtained From Patient Constitutional Symptoms (General Health) Complaints and Symptoms: Negative for: Fatigue; Fever; Chills; Marked Weight Change Eyes Complaints and Symptoms: Negative for: Dry Eyes; Vision Changes; Glasses / Contacts Marinos, Audie ([id number]) [phone].pdf Page 8 of 9 Ear/Nose/Mouth/Throat Complaints and Symptoms: Negative for: Difficult clearing ears; Sinusitis Hematologic/Lymphatic Complaints and Symptoms: Negative for: Bleeding / Clotting Disorders; Human Immunodeficiency Virus Gastrointestinal Complaints and Symptoms: Negative for: Frequent diarrhea; Nausea; Vomiting Genitourinary Complaints and Symptoms: Negative for: Kidney failure/ Dialysis; Incontinence/dribbling Immunological Complaints and Symptoms: Negative for:  Hives; Itching Integumentary (Skin) Complaints and Symptoms: Positive for: Swelling Negative for: Wounds Musculoskeletal Complaints and Symptoms: Negative for: Muscle Pain; Muscle Weakness Psychiatric Complaints and Symptoms: Negative for: Anxiety; Claustrophobia Respiratory Medical History: Positive for: Chronic Obstructive Pulmonary Disease (COPD);  Sleep Apnea Cardiovascular Medical History: Positive for: Congestive Heart Failure Endocrine Medical History: Past Medical History Notes: prediabetic Neurologic Medical History: Positive for: Neuropathy Oncologic Immunizations Pneumococcal Vaccine: Received Pneumococcal Vaccination: Yes Received Pneumococcal Vaccination On or After 60th Birthday: No Implantable Devices No devices added Family and Social History Current every day smoker - 1 ppd; Marital Status - Single; Alcohol Use: Never; Drug Use: No History; Caffeine Use: Rarely NAIDELIN, HAUENSTEIN ([id number]) [id number] Page 9 of 9 Electronic Signature(s) Signed: 06/07/2023 4:31:01 PM By: Allen Derry PA-C Signed: 06/07/2023 4:54:56 PM By: Midge Aver MSN RN CNS WTA Entered By: Midge Aver on 06/07/2023 11:59:49 -------------------------------------------------------------------------------- SuperBill Details Patient Name: Date of Service: [name], [name] 06/07/2023 Medical Record Number: [id number] Patient Account Number: [id number] Date of Birth/Sex: Treating RN: 1970/08/29 (52 y.o. Ginette Pitman Primary Care Provider: Eulogio Bear Other Clinician: Referring Provider: Treating Provider/Extender: Fredrik Rigger, Chioma Weeks in Treatment: 0 Diagnosis Coding ICD-10 Codes Code Description [phone] Chronic venous hypertension (idiopathic) with ulcer and inflammation of right lower extremity L03.115 Cellulitis of right lower limb L97.812 Non-pressure chronic ulcer of other part of right lower leg with fat layer exposed R73.03 Prediabetes J44.89 Other specified chronic obstructive pulmonary disease F17.218 Nicotine dependence, cigarettes, with other nicotine-induced disorders I50.42 Chronic combined systolic (congestive) and diastolic (congestive) heart failure Facility Procedures : CPT4 Code: [id number] Description: 84696 - WOUND CARE VISIT-LEV 4 NEW PT Modifier: Quantity:  1 : CPT4 Code: 29528413 Description: 99406-SMOKING CESSATION 3-10MINS ICD-10 Diagnosis Description F17.218 Nicotine dependence, cigarettes, with other nicotine-induced disorders Modifier: Quantity: 1 Physician Procedures : CPT4 Code Description Modifier [id number] - WC PHYS LEVEL 4 - EST PT 25 ICD-10 Diagnosis Description 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 lower extremity L03.115 Cellulitis of right lower limb L97.812  Non-pressure chronic ulcer of other part of right lower leg with fat layer exposed R73.03 Prediabetes Quantity: 1 : 99406 99406- SMOKING CESSATION 3-10 MINS ICD-10 Diagnosis Description F17.218 Nicotine dependence, cigarettes, with other nicotine-induced disorders Quantity: 1 Electronic Signature(s) Signed: 06/07/2023 3:40:38 PM By: Midge Aver MSN RN CNS WTA Signed: 06/07/2023 4:31:01 PM By: Allen Derry PA-C Previous Signature: 06/07/2023 3:16:31 PM Version By: Allen Derry PA-C Entered By: Midge Aver on 06/07/2023 12:40:38

## 2023-06-07 NOTE — Progress Notes (Signed)
TAYIA, STEIDINGER ([id number]) [id number] Page 1 of 10 Visit Report for 06/07/2023 Allergy List Details Patient Name: Date of Service: [name], [name] 06/07/2023 2:00 PM Medical Record Number: [id number] Patient Account Number: [id number] Date of Birth/Sex: Treating RN: 02-27-1971 (52 y.o. Ginette Pitman Primary Care Akaysha Cobern: Eulogio Bear Other Clinician: Referring Christian Treadway: Treating Adolf Ormiston/Extender: Fredrik Rigger, Chioma Weeks in Treatment: 0 Allergies Active Allergies No Known Allergies Allergy Notes Electronic Signature(s) Signed: 06/07/2023 4:54:56 PM By: Midge Aver MSN RN CNS WTA Entered By: Midge Aver on 06/07/2023 11:14:34 -------------------------------------------------------------------------------- Arrival Information Details Patient Name: Date of Service: [name], [name] 06/07/2023 2:00 PM Medical Record Number: [id number] Patient Account Number: [id number] Date of Birth/Sex: Treating RN: 07-09-1971 (52 y.o. Ginette Pitman Primary Care Jayven Naill: Eulogio Bear Other Clinician: Referring Jakory Matsuo: Treating Marrah Vanevery/Extender: Fredrik Rigger, Chioma Weeks in Treatment: 0 Visit Information Patient Arrived: Ambulatory Arrival Time: 14:07 Accompanied By: sister Transfer Assistance: None Patient Identification Verified: Yes Secondary Verification Process Completed: Yes Patient Requires Transmission-Based Precautions: No Patient Has Alerts: Yes Patient Alerts: Prediabetic Electronic Signature(s) Signed: 06/07/2023 4:54:56 PM By: Midge Aver MSN RN CNS WTA Entered By: Midge Aver on 06/07/2023 11:10:57 Daryel Gerald ([id number]) [id number] Page 2 of 10 -------------------------------------------------------------------------------- Clinic Level of Care Assessment Details Patient Name: Date of Service: [name], [name] 06/07/2023 2:00 PM Medical Record Number: [id number] Patient  Account Number: [id number] Date of Birth/Sex: Treating RN: 04/05/1971 (52 y.o. Ginette Pitman Primary Care Carlin Attridge: Eulogio Bear Other Clinician: Referring Eean Buss: Treating Ishia Tenorio/Extender: Fredrik Rigger, Chioma Weeks in Treatment: 0 Clinic Level of Care Assessment Items TOOL 2 Quantity Score X- 1 0 Use when only an EandM is performed on the INITIAL visit ASSESSMENTS - Nursing Assessment / Reassessment X- 1 20 General Physical Exam (combine w/ comprehensive assessment (listed just below) when performed on new pt. evals) X- 1 25 Comprehensive Assessment (HX, ROS, Risk Assessments, Wounds Hx, etc.) ASSESSMENTS - Wound and Skin A ssessment / Reassessment X - Simple Wound Assessment / Reassessment - one wound 1 5 []  - 0 Complex Wound Assessment / Reassessment - multiple wounds []  - 0 Dermatologic / Skin Assessment (not related to wound area) ASSESSMENTS - Ostomy and/or Continence Assessment and Care []  - 0 Incontinence Assessment and Management []  - 0 Ostomy Care Assessment and Management (repouching, etc.) PROCESS - Coordination of Care X - Simple Patient / Family Education for ongoing care 1 15 []  - 0 Complex (extensive) Patient / Family Education for ongoing care X- 1 10 Staff obtains Chiropractor, Records, T Results / Process Orders est []  - 0 Staff telephones HHA, Nursing Homes / Clarify orders / etc []  - 0 Routine Transfer to another Facility (non-emergent condition) []  - 0 Routine Hospital Admission (non-emergent condition) X- 1 15 New Admissions / Manufacturing engineer / Ordering NPWT Apligraf, etc. , []  - 0 Emergency Hospital Admission (emergent condition) X- 1 10 Simple Discharge Coordination []  - 0 Complex (extensive) Discharge Coordination PROCESS - Special Needs []  - 0 Pediatric / Minor Patient Management []  - 0 Isolation Patient Management []  - 0 Hearing / Language / Visual special needs []  - 0 Assessment of Community assistance  (transportation, D/C planning, etc.) []  - 0 Additional assistance / Altered mentation []  - 0 Support Surface(s) Assessment (bed, cushion, seat, etc.) INTERVENTIONS - Wound Cleansing / Measurement [name], [name] ([id number]) [phone].pdf Page 3 of 10 X- 1 5 Wound Imaging (photographs - any number of wounds) []  - 0 Wound Tracing (instead of photographs) X- 1 5 Simple Wound Measurement -  one wound []  - 0 Complex Wound Measurement - multiple wounds X- 1 5 Simple Wound Cleansing - one wound []  - 0 Complex Wound Cleansing - multiple wounds INTERVENTIONS - Wound Dressings []  - 0 Small Wound Dressing one or multiple wounds X- 1 15 Medium Wound Dressing one or multiple wounds []  - 0 Large Wound Dressing one or multiple wounds []  - 0 Application of Medications - injection INTERVENTIONS - Miscellaneous []  - 0 External ear exam []  - 0 Specimen Collection (cultures, biopsies, blood, body fluids, etc.) []  - 0 Specimen(s) / Culture(s) sent or taken to Lab for analysis []  - 0 Patient Transfer (multiple staff / Nurse, adult / Similar devices) []  - 0 Simple Staple / Suture removal (25 or less) []  - 0 Complex Staple / Suture removal (26 or more) []  - 0 Hypo / Hyperglycemic Management (close monitor of Blood Glucose) X- 1 15 Ankle / Brachial Index (ABI) - do not check if billed separately Has the patient been seen at the hospital within the last three years: Yes Total Score: 145 Level Of Care: New/Established - Level 4 Electronic Signature(s) Signed: 06/07/2023 4:54:56 PM By: Midge Aver MSN RN CNS WTA Entered By: Midge Aver on 06/07/2023 12:40:12 -------------------------------------------------------------------------------- Compression Therapy Details Patient Name: Date of Service: [name], [name] 06/07/2023 2:00 PM Medical Record Number: [id number] Patient Account Number: [id number] Date of Birth/Sex: Treating RN: [birth year] (52 y.o. Ginette Pitman Primary Care Isak Sotomayor: Eulogio Bear Other Clinician: Referring Skyleigh Windle: Treating Sequita Wise/Extender: Fredrik Rigger, Chioma Weeks in Treatment: 0 Compression Therapy Performed for Wound Assessment: Wound #1 Right,Lateral Lower Leg Performed By: Clinician Midge Aver, RN Compression Type: Three Layer Post Procedure Diagnosis Same as Pre-procedure Electronic Signature(s) Signed: 06/07/2023 3:36:23 PM By: Midge Aver MSN RN CNS WTA Entered By: Midge Aver on 06/07/2023 12:36:22 Daryel Gerald ([id number]) [id number] Page 4 of 10 -------------------------------------------------------------------------------- Encounter Discharge Information Details Patient Name: Date of Service: [name], [name] 06/07/2023 2:00 PM Medical Record Number: [id number] Patient Account Number: [id number] Date of Birth/Sex: Treating RN: [birth year] (52 y.o. Ginette Pitman Primary Care Dayan Kreis: Eulogio Bear Other Clinician: Referring Chibuike Fleek: Treating Cleophus Mendonsa/Extender: Fredrik Rigger, Chioma Weeks in Treatment: 0 Encounter Discharge Information Items Discharge Condition: Stable Ambulatory Status: Ambulatory Discharge Destination: Home Transportation: Private Auto Accompanied By: Sister Schedule Follow-up Appointment: Yes Clinical Summary of Care: Electronic Signature(s) Signed: 06/07/2023 3:42:56 PM By: Midge Aver MSN RN CNS WTA Entered By: Midge Aver on 06/07/2023 12:42:56 -------------------------------------------------------------------------------- Lower Extremity Assessment Details Patient Name: Date of Service: [name], [name] 06/07/2023 2:00 PM Medical Record Number: [id number] Patient Account Number: [id number] Date of Birth/Sex: Treating RN: 07/26/1971 (53 y.o. Ginette Pitman Primary Care Saiquan Hands: Eulogio Bear Other Clinician: Referring Josedejesus Marcum: Treating Peretz Thieme/Extender: Fredrik Rigger, Chioma Weeks in  Treatment: 0 Edema Assessment Assessed: [Left: No] [Right: No] [Left: Edema] [Right: :] Calf Left: Right: Point of Measurement: 33 cm From Medial Instep 52.56 cm Ankle Left: Right: Point of Measurement: 12 cm From Medial Instep 26 cm Knee To Floor Archbold, Kathie Rhodes ([id number]) [id number] Page 5 of 10 Left: Right: From Medial Instep 40 cm Vascular Assessment Pulses: Dorsalis Pedis Palpable: [Right:Yes] Doppler Audible: [Right:Yes] Posterior Tibial Palpable: [Right:No] Doppler Audible: [Right:Yes] Popliteal Palpable: [Right:Yes] Doppler Audible: [Right:Yes] Extremity colors, hair growth, and conditions: Extremity Color: [Right:Red] Hair Growth on Extremity: [Right:Yes] Temperature of Extremity: [Right:Warm] Capillary Refill: [Right:< 3 seconds] Dependent Rubor: [Right:No] Blanched when Elevated: [Right:No] Lipodermatosclerosis: [Right:No] Blood Pressure: Brachial: [Right:133] Ankle: [Right:Dorsalis Pedis: 120 0.90] Toe Nail Assessment Left: Right: Thick:  No Discolored: No Deformed: No Improper Length and Hygiene: No Electronic Signature(s) Signed: 06/07/2023 4:54:56 PM By: Midge Aver MSN RN CNS WTA Entered By: Midge Aver on 06/07/2023 11:43:18 -------------------------------------------------------------------------------- Multi Wound Chart Details Patient Name: Date of Service: [name], [name] 06/07/2023 2:00 PM Medical Record Number: [id number] Patient Account Number: [id number] Date of Birth/Sex: Treating RN: 02-11-1971 (52 y.o. Ginette Pitman Primary Care Hasini Peachey: Eulogio Bear Other Clinician: Referring Dong Nimmons: Treating Christmas Faraci/Extender: Fredrik Rigger, Chioma Weeks in Treatment: 0 Vital Signs Height(in): 61 Pulse(bpm): 57 Weight(lbs): 330 Blood Pressure(mmHg): 133/84 Body Mass Index(BMI): 62.3 Temperature(F): 97.7 Respiratory Rate(breaths/min): 18 [1:Photos:] [N/A:N/A] Right, Lateral Lower Leg N/A  N/A Wound Location: Gradually Appeared N/A N/A Wounding Event: Atypical N/A N/A Primary Etiology: Chronic Obstructive Pulmonary N/A N/A Comorbid History: Disease (COPD), Sleep Apnea, Neuropathy 04/06/2023 N/A N/A Date Acquired: 0 N/A N/A Weeks of Treatment: Open N/A N/A Wound Status: No N/A N/A Wound Recurrence: 8x5x0.1 N/A N/A Measurements L x W x D (cm) 31.416 N/A N/A A (cm) : rea 3.142 N/A N/A Volume (cm) : Full Thickness Without Exposed N/A N/A Classification: Support Structures Medium N/A N/A Exudate Amount: Serous N/A N/A Exudate Type: amber N/A N/A Exudate Color: Medium (34-66%) N/A N/A Granulation Amount: Red, Pink N/A N/A Granulation Quality: Fat Layer (Subcutaneous Tissue): Yes N/A N/A Exposed Structures: Fascia: No Tendon: No Muscle: No Joint: No Bone: No Treatment Notes Electronic Signature(s) Signed: 06/07/2023 4:54:56 PM By: Midge Aver MSN RN CNS WTA Entered By: Midge Aver on 06/07/2023 12:00:18 -------------------------------------------------------------------------------- Multi-Disciplinary Care Plan Details Patient Name: Date of Service: [name], [name] 06/07/2023 2:00 PM Medical Record Number: [id number] Patient Account Number: [id number] Date of Birth/Sex: Treating RN: [birth year] (52 y.o. Ginette Pitman Primary Care Shadoe Cryan: Eulogio Bear Other Clinician: Referring Tanyah Debruyne: Treating Nubia Ziesmer/Extender: Fredrik Rigger, Chioma Weeks in Treatment: 0 Active Inactive Orientation to the Wound Care Program Nursing Diagnoses: Knowledge deficit related to the wound healing center program Goals: Patient/caregiver will verbalize understanding of the Wound Healing Center Program Date Initiated: 06/07/2023 Target Resolution Date: 06/14/2023 Goal Status: Active Interventions: Provide education on orientation to the wound center [place], Kathie Rhodes ([id number]) [id number] Page 7 of 10 Notes: Wound/Skin  Impairment Nursing Diagnoses: Impaired tissue integrity Knowledge deficit related to smoking impact on wound healing Knowledge deficit related to ulceration/compromised skin integrity Goals: Patient will demonstrate a reduced rate of smoking or cessation of smoking Date Initiated: 06/07/2023 Target Resolution Date: 07/08/2023 Goal Status: Active Patient/caregiver will verbalize understanding of skin care regimen Date Initiated: 06/07/2023 Target Resolution Date: 07/07/2023 Goal Status: Active Ulcer/skin breakdown will have a volume reduction of 30% by week 4 Date Initiated: 06/07/2023 Target Resolution Date: 07/07/2023 Goal Status: Active Ulcer/skin breakdown will have a volume reduction of 50% by week 8 Date Initiated: 06/07/2023 Target Resolution Date: 08/07/2023 Goal Status: Active Ulcer/skin breakdown will have a volume reduction of 80% by week 12 Date Initiated: 06/07/2023 Target Resolution Date: 09/07/2023 Goal Status: Active Interventions: Assess patient/caregiver ability to obtain necessary supplies Assess patient/caregiver ability to perform ulcer/skin care regimen upon admission and as needed Assess ulceration(s) every visit Provide education on smoking Provide education on ulcer and skin care Treatment Activities: Skin care regimen initiated : 06/07/2023 Notes: Electronic Signature(s) Signed: 06/07/2023 3:42:00 PM By: Midge Aver MSN RN CNS WTA Previous Signature: 06/07/2023 3:41:52 PM Version By: Midge Aver MSN RN CNS WTA Entered By: Midge Aver on 06/07/2023 12:42:00 -------------------------------------------------------------------------------- Pain Assessment Details Patient Name: Date of Service: [name], [name] 06/07/2023 2:00 PM Medical Record Number: [id number] Patient  Account Number: [id number] Date of Birth/Sex: Treating RN: 1970-10-23 (52 y.o. Ginette Pitman Primary Care Kymberley Raz: Eulogio Bear Other Clinician: Referring Diannia Hogenson: Treating  Axle Parfait/Extender: Fredrik Rigger, Chioma Weeks in Treatment: 0 Active Problems Location of Pain Severity and Description of Pain Patient Has Paino Yes Site Locations Rate the pain. KEMANI, PURSLEY ([id number]) [id number] Page 8 of 10 Rate the pain. Current Pain Level: 5 Character of Pain Describe the Pain: Burning, Sharp, Shooting Pain Management and Medication Current Pain Management: Electronic Signature(s) Signed: 06/07/2023 4:54:56 PM By: Midge Aver MSN RN CNS WTA Entered By: Midge Aver on 06/07/2023 11:11:25 -------------------------------------------------------------------------------- Patient/Caregiver Education Details Patient Name: Date of Service: [name], [name] 11/11/2024andnbsp2:00 PM Medical Record Number: [id number] Patient Account Number: [id number] Date of Birth/Gender: Treating RN: 10-11-1970 (52 y.o. Ginette Pitman Primary Care Physician: Eulogio Bear Other Clinician: Referring Physician: Treating Physician/Extender: Fredrik Rigger, Chioma Weeks in Treatment: 0 Education Assessment Education Provided To: Patient Education Topics Provided Wound/Skin Impairment: Handouts: Caring for Your Ulcer Methods: Explain/Verbal Responses: State content correctly Electronic Signature(s) Signed: 06/07/2023 4:54:56 PM By: Midge Aver MSN RN CNS WTA Entered By: Midge Aver on 06/07/2023 12:42:19 Daryel Gerald ([id number]) [id number] Page 9 of 10 -------------------------------------------------------------------------------- Wound Assessment Details Patient Name: Date of Service: [name], [name] 06/07/2023 2:00 PM Medical Record Number: [id number] Patient Account Number: [id number] Date of Birth/Sex: Treating RN: 07-11-1971 (52 y.o. Ginette Pitman Primary Care Jeralyn Nolden: Eulogio Bear Other Clinician: Referring Christeen Lai: Treating Elane Peabody/Extender: Fredrik Rigger,  Chioma Weeks in Treatment: 0 Wound Status Wound Number: 1 Primary Atypical Etiology: Wound Location: Right, Lateral Lower Leg Wound Status: Open Wounding Event: Gradually Appeared Comorbid Chronic Obstructive Pulmonary Disease (COPD), Sleep Date Acquired: 04/06/2023 History: Apnea, Neuropathy Weeks Of Treatment: 0 Clustered Wound: No Photos Wound Measurements Length: (cm) Width: (cm) Depth: (cm) Area: (cm) Volume: (cm) 8 % Reduction in Area: 5 % Reduction in Volume: 0.1 31.416 3.142 Wound Description Classification: Full Thickness Without Exposed Support Exudate Amount: Medium Exudate Type: Serous Exudate Color: amber Structures Wound Bed Granulation Amount: Medium (34-66%) Exposed Structure Granulation Quality: Red, Pink Fascia Exposed: No Fat Layer (Subcutaneous Tissue) Exposed: Yes Tendon Exposed: No Muscle Exposed: No Joint Exposed: No Bone Exposed: No Treatment Notes Wound #1 (Lower Leg) Wound Laterality: Right, Lateral Cleanser Soap and Water Discharge Instruction: Gently cleanse wound with antibacterial soap, rinse and pat dry prior to dressing wounds Vashe 5.8 (oz) Jennings, [name] ([id number]) [id number] Page 10 of 10 Discharge Instruction: Use vashe 5.8 (oz) as directed Peri-Wound Care Topical Primary Dressing Silvercel Small 2x2 (in/in) Discharge Instruction: Apply Silvercel Small 2x2 (in/in) as instructed Secondary Dressing Zetuvit Plus 4x8 (in/in) Secured With Compression Wrap Urgo K2 Lite, two layer compression system, regular Compression Stockings Add-Ons Electronic Signature(s) Signed: 06/07/2023 4:54:56 PM By: Midge Aver MSN RN CNS WTA Entered By: Midge Aver on 06/07/2023 11:34:09 -------------------------------------------------------------------------------- Vitals Details Patient Name: Date of Service: [name], [name] 06/07/2023 2:00 PM Medical Record Number: [id number] Patient Account Number:  [id number] Date of Birth/Sex: Treating RN: 1971-07-16 (52 y.o. Ginette Pitman Primary Care Estalene Bergey: Eulogio Bear Other Clinician: Referring Kelina Beauchamp: Treating Mantaj Chamberlin/Extender: Fredrik Rigger, Chioma Weeks in Treatment: 0 Vital Signs Time Taken: 14:11 Temperature (F): 97.7 Height (in): 61 Pulse (bpm): 57 Source: Stated Respiratory Rate (breaths/min): 18 Weight (lbs): 330 Blood Pressure (mmHg): 133/84 Source: Stated Reference Range: 80 - 120 mg / dl Body Mass Index (BMI): 62.3 Electronic Signature(s) Signed: 06/07/2023 4:54:56 PM By: Midge Aver MSN RN CNS WTA Entered By: Midge Aver on 06/07/2023  11:14:18 

## 2023-06-07 NOTE — Progress Notes (Signed)
[name] ([id number]) [id number] Page 1 of 5 Visit Report for 06/07/2023 Abuse Risk Screen Details Patient Name: Date of Service: [name] 06/07/2023 2:00 PM Medical Record Number: [id number] Patient Account Number: [id number] Date of Birth/Sex: Treating RN: [birth year] (52 y.o. [name] Primary Care [name]: [name] Other Clinician: Referring [name]: Treating [name]/Extender: [name], [name] in Treatment: 0 Abuse Risk Screen Items Answer Electronic Signature(s) Signed: 06/07/2023 4:54:56 PM By: [name] Aver MSN RN CNS WTA Entered By: [name] on 06/07/2023 11:20:46 -------------------------------------------------------------------------------- Activities of Daily Living Details Patient Name: Date of Service: [name] 06/07/2023 2:00 PM Medical Record Number: [id number] Patient Account Number: [id number] Date of Birth/Sex: Treating RN: Nov 22, 1970 (52 y.o. [name] Primary Care Sher Hellinger: [name] Other Clinician: Referring Shanita Kanan: Treating Retal Tonkinson/Extender: [name], [name] in Treatment: 0 Activities of Daily Living Items Answer Activities of Daily Living (Please select one for each item) Drive Automobile Completely Able T Medications ake Completely Able Use T elephone Completely Able Care for Appearance Completely Able Use T oilet Completely Able Bath / Shower Completely Able Dress Self Completely Able Feed Self Completely Able Walk Completely Able Get In / Out Bed Completely Able Housework Completely Able Prepare Meals Completely Able Handle Money Completely Able Shop for Self Completely ALILA, [place]ORTH ([id number]) [phone] [id number].pdf Page 2 of 5 Electronic Signature(s) Signed: 06/07/2023 4:54:56 PM By: [name] Aver MSN RN CNS WTA Entered By: [name] on 06/07/2023  11:21:00 -------------------------------------------------------------------------------- Education Screening Details Patient Name: Date of Service: [name] 06/07/2023 2:00 PM Medical Record Number: [id number] Patient Account Number: [id number] Date of Birth/Sex: Treating RN: [birth year] (52 y.o. [name] Primary Care Lorene Klimas: [name] Other Clinician: Referring Cheng Dec: Treating Grady Lucci/Extender: [name], [name] in Treatment: 0 Learning Preferences/Education Level/Primary Language Learning Preference: Explanation Preferred Language: English Cognitive Barrier Language Barrier: No Translator Needed: No Memory Deficit: No Emotional Barrier: No Cultural/Religious Beliefs Affecting Medical Care: No Physical Barrier Impaired Vision: No Impaired Hearing: No Decreased Hand dexterity: No Knowledge/Comprehension Knowledge Level: High Comprehension Level: High Ability to understand written instructions: High Ability to understand verbal instructions: High Motivation Anxiety Level: Calm Cooperation: Cooperative Education Importance: Acknowledges Need Interest in Health Problems: Asks Questions Perception: Coherent Willingness to Engage in Self-Management High Activities: Readiness to Engage in Self-Management High Activities: Electronic Signature(s) Signed: 06/07/2023 4:54:56 PM By: [name] Aver MSN RN CNS WTA Entered By: [name] on 06/07/2023 11:22:09 Daryel Gerald ([id number]) [id number] Page 3 of 5 -------------------------------------------------------------------------------- Fall Risk Assessment Details Patient Name: Date of Service: [name] 06/07/2023 2:00 PM Medical Record Number: [id number] Patient Account Number: [id number] Date of Birth/Sex: Treating RN: May 06, 1971 (52 y.o. [name] Primary Care Cadyn Rodger: [name] Other Clinician: Referring  Kenta Laster: Treating Llewyn Heap/Extender: [name], [name] in Treatment: 0 Fall Risk Assessment Items Have you had 2 or more falls in the last 12 monthso 0 No Have you had any fall that resulted in injury in the last 12 monthso 0 No FALLS RISK SCREEN History of falling - immediate or within 3 months 0 No Secondary diagnosis (Do you have 2 or more medical diagnoseso) 0 No Ambulatory aid None/bed rest/wheelchair/nurse 0 No Crutches/cane/walker 0 No Furniture 0 No Intravenous therapy Access/Saline/Heparin Lock 0 No Gait/Transferring Normal/ bed rest/ wheelchair 0 Yes Weak (short steps with or without shuffle, stooped but able to lift head while walking, may seek 0 No support from furniture) Impaired (short steps with shuffle, may have difficulty  arising from chair, head down, impaired 0 No balance) Mental Status Oriented to own ability 0 Yes Electronic Signature(s) Signed: 06/07/2023 4:54:56 PM By: [name] Aver MSN RN CNS WTA Entered By: [name] on 06/07/2023 11:22:39 -------------------------------------------------------------------------------- Foot Assessment Details Patient Name: Date of Service: [name] 06/07/2023 2:00 PM Medical Record Number: [id number] Patient Account Number: [id number] Date of Birth/Sex: Treating RN: 01/02/1971 (52 y.o. [name] Primary Care [place] Giammona: [name] Other Clinician: Referring Surina Storts: Treating Deandre Stansel/Extender: [name], [name] in Treatment: 0 Foot Assessment Items Site Locations [place], [place] ([id number]) [id number] Page 4 of 5 + = Sensation present, - = Sensation absent, C = Callus, U = Ulcer R = Redness, W = Warmth, M = Maceration, PU = Pre-ulcerative lesion F = Fissure, S = Swelling, D = Dryness Assessment Right: Left: Other Deformity: No No Prior Foot Ulcer: No No Prior Amputation: No No Charcot Joint: No No Ambulatory Status:  Ambulatory Without Help Gait: Steady Electronic Signature(s) Signed: 06/07/2023 4:54:56 PM By: [name] Aver MSN RN CNS WTA Entered By: [name] on 06/07/2023 11:31:41 -------------------------------------------------------------------------------- Nutrition Risk Screening Details Patient Name: Date of Service: [name] 06/07/2023 2:00 PM Medical Record Number: [id number] Patient Account Number: [id number] Date of Birth/Sex: Treating RN: 05/15/1971 (52 y.o. [name] Primary Care Karynn Deblasi: [name] Other Clinician: Referring Sim Choquette: Treating Loyd Marhefka/Extender: [name], [name] in Treatment: 0 Height (in): 61 Weight (lbs): 330 Body Mass Index (BMI): 62.3 Nutrition Risk Screening Items Score Screening NUTRITION RISK SCREEN: I have an illness or condition that made me change the kind and/or amount of food I eat 0 No I eat fewer than two meals per day 0 No I eat few fruits and vegetables, or milk products 0 No I have three or more drinks of beer, liquor or wine almost every day 0 No I have tooth or mouth problems that make it hard for me to eat 0 No I don't always have enough money to buy the food I need 0 No [place], [place] ([id number]) [id number] [id number].pdf Page 5 of 5 I eat alone most of the time 0 No I take three or more different prescribed or over-the-counter drugs a day 1 Yes Without wanting to, I have lost or gained 10 pounds in the last six months 0 No I am not always physically able to shop, cook and/or feed myself 0 No Nutrition Protocols Good Risk Protocol 0 No interventions needed Moderate Risk Protocol High Risk Proctocol Risk Level: Good Risk Score: 1 Electronic Signature(s) Signed: 06/07/2023 4:54:56 PM By: [name] Aver MSN RN CNS WTA Entered By: [name] on 06/07/2023 11:22:55

## 2023-06-10 DIAGNOSIS — I87331 Chronic venous hypertension (idiopathic) with ulcer and inflammation of right lower extremity: Secondary | ICD-10-CM | POA: Diagnosis not present

## 2023-06-10 NOTE — Progress Notes (Signed)
[name], [name] ([id number]) [id number] Page 1 of 4 Visit Report for 06/10/2023 Arrival Information Details Patient Name: Date of Service: [name] [name] 06/10/2023 3:45 PM Medical Record Number: [id number] Patient Account Number: [id number] Date of Birth/Sex: Treating RN: 12-Aug-1970 (52 Joyceo. [name] [name] Primary Care [name] [name]: [name] [name] Other Clinician: Referring [name] [name]: Treating [name] [name]/Extender: [name] [name], [name] [name] in Treatment: 0 Visit Information History Since Last Visit Added or deleted any medications: No Patient Arrived: Ambulatory Any new allergies or adverse reactions: No Arrival Time: 16:15 Had a fall or experienced change in No Accompanied By: family activities of daily living that may affect Transfer Assistance: None risk of falls: Patient Identification Verified: Yes Hospitalized since last visit: No Secondary Verification Process Completed: Yes Has Dressing in Place as Prescribed: Yes Patient Requires Transmission-Based Precautions: No Has Compression in Place as Prescribed: Yes Patient Has Alerts: Yes Pain Present Now: Yes Patient Alerts: Prediabetic Electronic Signature(s) Signed: 06/10/2023 4:15:41 PM By: [name] [name] Entered By: [name] [name] on 06/10/2023 13:15:40 -------------------------------------------------------------------------------- Clinic Level of Care Assessment Details Patient Name: Date of Service: [name] [name] 06/10/2023 3:45 PM Medical Record Number: [id number] Patient Account Number: [id number] Date of Birth/Sex: Treating RN: 09-18-1970 (52 Joyceo. [name] [name] Primary Care Kaseem Vastine: [name] [name] Other Clinician: Referring [name] [name]: Treating [name] [name]/Extender: [name] [name], [name] [name] in Treatment: 0 Clinic Level of Care Assessment Items TOOL 1 Quantity Score []  - 0 Use when EandM and Procedure is performed on INITIAL visit ASSESSMENTS -  Nursing Assessment / Reassessment []  - 0 General Physical Exam (combine w/ comprehensive assessment (listed just below) when performed on new pt. evals) []  - 0 Comprehensive Assessment (HX, ROS, Risk Assessments, Wounds Hx, etc.) ASSESSMENTS - Wound and Skin Assessment / Reassessment []  - 0 Dermatologic / Skin Assessment (not related to wound area) [name], [name] ([id number]) [id number] Page 2 of 4 ASSESSMENTS - Ostomy and/or Continence Assessment and Care []  - 0 Incontinence Assessment and Management []  - 0 Ostomy Care Assessment and Management (repouching, etc.) PROCESS - Coordination of Care []  - 0 Simple Patient / Family Education for ongoing care []  - 0 Complex (extensive) Patient / Family Education for ongoing care []  - 0 Staff obtains Chiropractor, Records, T Results / Process Orders est []  - 0 Staff telephones HHA, Nursing Homes / Clarify orders / etc []  - 0 Routine Transfer to another Facility (non-emergent condition) []  - 0 Routine Hospital Admission (non-emergent condition) []  - 0 New Admissions / Manufacturing engineer / Ordering NPWT Apligraf, etc. , []  - 0 Emergency Hospital Admission (emergent condition) PROCESS - Special Needs []  - 0 Pediatric / Minor Patient Management []  - 0 Isolation Patient Management []  - 0 Hearing / Language / Visual special needs []  - 0 Assessment of Community assistance (transportation, D/C planning, etc.) []  - 0 Additional assistance / Altered mentation []  - 0 Support Surface(s) Assessment (bed, cushion, seat, etc.) INTERVENTIONS - Miscellaneous []  - 0 External ear exam []  - 0 Patient Transfer (multiple staff / Nurse, adult / Similar devices) []  - 0 Simple Staple / Suture removal (25 or less) []  - 0 Complex Staple / Suture removal (26 or more) []  - 0 Hypo/Hyperglycemic Management (do not check if billed separately) []  - 0 Ankle / Brachial Index (ABI) - do not check if billed separately Has the  patient been seen at the hospital within the last three years: Yes Total Score: 0 Level Of Care: ____ Electronic Signature(s) Signed: 06/10/2023 4:29:52 PM By: [name] [name] Entered By: [name] [name] on  06/10/2023 13:18:01 -------------------------------------------------------------------------------- Compression Therapy Details Patient Name: Date of Service: [name] [name] 06/10/2023 3:45 PM Medical Record Number: [id number] Patient Account Number: [id number] Date of Birth/Sex: Treating RN: [birth year] (52 Joyceo. [name] [name] Primary Care [name] [name]: [name] [name] Other Clinician: Referring [name] [name]: Treating [name] [name]/Extender: [name] [name], [name] [name] in Treatment: 0 Compression Therapy Performed for Wound Assessment: Wound #1 Right,Lateral Lower Leg Performed By: [name] Bodily, RN Compression TypeOsborn Coho, [name] Rhodes ([id number]) [id number] Page 3 of 4 Electronic Signature(s) Signed: 06/10/2023 4:16:14 PM By: [name] [name] Entered By: [name] [name] on 06/10/2023 13:16:14 -------------------------------------------------------------------------------- Encounter Discharge Information Details Patient Name: Date of Service: [name] [name] 06/10/2023 3:45 PM Medical Record Number: [id number] Patient Account Number: [id number] Date of Birth/Sex: Treating RN: 12-Jul-1971 (52 Joyceo. [name] [name] Primary Care [name] [name]: [name] [name] Other Clinician: Referring [name] [name]: Treating [name] [name]/Extender: [name] [name], [name] [name] in Treatment: 0 Encounter Discharge Information Items Discharge Condition: Stable Ambulatory Status: Ambulatory Discharge Destination: Home Transportation: Private Auto Accompanied By: family Schedule Follow-up Appointment: Yes Clinical Summary of Care: Electronic Signature(s) Signed: 06/10/2023 4:16:52 PM By: [name] [name] Entered By: [name] [name] on 06/10/2023  13:16:52 -------------------------------------------------------------------------------- Wound Assessment Details Patient Name: Date of Service: [name] [name] 06/10/2023 3:45 PM Medical Record Number: [id number] Patient Account Number: [id number] Date of Birth/Sex: Treating RN: 04/19/1971 (81 Joyceo. [name] [name] Primary Care Erandy Mceachern: [name] [name] Other Clinician: Referring Fayette Gasner: Treating Jaylyn Booher/Extender: [name] [name], [name] [name] in Treatment: 0 Wound Status Wound Number: 1 Primary Atypical Etiology: Wound Location: Right, Lateral Lower Leg Wound Open Wounding Event: Gradually Appeared Status: Date Acquired: 04/06/2023 Comorbid Chronic Obstructive Pulmonary Disease (COPD), Sleep Apnea, [name] Of Treatment: 0 History: Congestive Heart Failure, Neuropathy Clustered Wound: No Wound Measurements [name], [name] ([id number]) Length: (cm) 8 Width: (cm) 5 Depth: (cm) 0.1 Area: (cm) 31.416 Volume: (cm) 3.142 [id number] Page 4 of 4 % Reduction in Area: 0% % Reduction in Volume: 0% Wound Description Classification: Full Thickness Without Exposed Suppor Exudate Amount: Medium Exudate Type: Serous Exudate Color: amber t Structures Wound Bed Granulation Amount: Medium (34-66%) Exposed Structure Granulation Quality: Red, Pink Fascia Exposed: No Fat Layer (Subcutaneous Tissue) Exposed: Yes Tendon Exposed: No Muscle Exposed: No Joint Exposed: No Bone Exposed: No Treatment Notes Wound #1 (Lower Leg) Wound Laterality: Right, Lateral Cleanser Soap and Water Discharge Instruction: Gently cleanse wound with antibacterial soap, rinse and pat dry prior to dressing wounds Vashe 5.8 (oz) Discharge Instruction: Use vashe 5.8 (oz) as directed Peri-Wound Care Topical Primary Dressing Silvercel Small 2x2 (in/in) Discharge Instruction: Apply Silvercel Small 2x2 (in/in) as instructed Secondary Dressing Zetuvit Plus 4x8  (in/in) Secured With Compression Wrap Urgo K2 Lite, two layer compression system, regular Compression Stockings Add-Ons Electronic Signature(s) Signed: 06/10/2023 4:15:52 PM By: [name] [name] Entered By: [name] [name] on 06/10/2023 13:15:52

## 2023-06-11 NOTE — Progress Notes (Signed)
THELMER, EICHE ([id number]) [id number] Page 1 of 2 Visit Report for 06/10/2023 Physician Orders Details Patient Name: Date of Service: [name], Corean 06/10/2023 3:45 PM Medical Record Number: [id number] Patient Account Number: [id number] Date of Birth/Sex: Treating RN: 17-May-1971 (52 y.o. Esmeralda Links Primary Care Provider: Eulogio Bear Other Clinician: Referring Provider: Treating Provider/Extender: Fredrik Rigger, Chioma Weeks in Treatment: 0 The following information was scribed by: Angelina Pih The information was scribed for: Allen Derry Verbal / Phone Orders: No Diagnosis Coding Follow-up Appointments Return Appointment in 1 week. Nurse Visit as needed Edema Control - Orders / Instructions UrgoK2 LITE Wound Treatment Wound #1 - Lower Leg Wound Laterality: Right, Lateral Cleanser: Soap and Water 1 x Per Week/30 Days Discharge Instructions: Gently cleanse wound with antibacterial soap, rinse and pat dry prior to dressing wounds Cleanser: Vashe 5.8 (oz) 1 x Per Week/30 Days Discharge Instructions: Use vashe 5.8 (oz) as directed Prim Dressing: Silvercel Small 2x2 (in/in) 1 x Per Week/30 Days ary Discharge Instructions: Apply Silvercel Small 2x2 (in/in) as instructed Secondary Dressing: Zetuvit Plus 4x8 (in/in) 1 x Per Week/30 Days Compression Wrap: Urgo K2 Lite, two layer compression system, regular 1 x Per Week/30 Days Electronic Signature(s) Signed: 06/10/2023 4:16:36 PM By: Angelina Pih Signed: 06/10/2023 5:33:14 PM By: Allen Derry PA-C Entered By: Angelina Pih on 06/10/2023 16:16:36 -------------------------------------------------------------------------------- SuperBill Details Patient Name: Date of Service: [name], [name] 06/10/2023 Daryel Gerald ([id number]) [id number] Page 2 of 2 Medical Record Number: [id number] Patient Account Number: [id number] Date of Birth/Sex:  Treating RN: 08-06-1970 (52 y.o. Esmeralda Links Primary Care Provider: Eulogio Bear Other Clinician: Referring Provider: Treating Provider/Extender: Fredrik Rigger, Chioma Weeks in Treatment: 0 Diagnosis Coding ICD-10 Codes Code Description [phone] Chronic venous hypertension (idiopathic) with ulcer and inflammation of right lower extremity L03.115 Cellulitis of right lower limb L97.812 Non-pressure chronic ulcer of other part of right lower leg with fat layer exposed R73.03 Prediabetes J44.89 Other specified chronic obstructive pulmonary disease F17.218 Nicotine dependence, cigarettes, with other nicotine-induced disorders I50.42 Chronic combined systolic (congestive) and diastolic (congestive) heart failure Facility Procedures : CPT4 Code Description: [id number] (Facility Use Only) [phone] - APPLY MULTLAY COMPRS LWR RT LEG Modifier: Quantity: 1 Electronic Signature(s) Signed: 06/10/2023 4:18:11 PM By: Angelina Pih Signed: 06/10/2023 5:33:14 PM By: Allen Derry PA-C Entered By: Angelina Pih on 06/10/2023 16:18:10

## 2023-06-14 ENCOUNTER — Encounter: Payer: 59 | Admitting: Physician Assistant

## 2023-06-14 DIAGNOSIS — I87331 Chronic venous hypertension (idiopathic) with ulcer and inflammation of right lower extremity: Secondary | ICD-10-CM | POA: Diagnosis not present

## 2023-06-14 NOTE — Progress Notes (Signed)
LETIA, TRUMM ([id number]) [id number] Page 1 of 8 Visit Report for 06/14/2023 Chief Complaint Document Details Patient Name: Date of Service: [name] [name] 06/14/2023 8:00 A M Medical Record Number: [id number] Patient Account Number: [id number] Date of Birth/Sex: Treating RN: 1970/08/19 (52 y.o. [name] Primary Care Provider: Eulogio Bear Other Clinician: Referring Provider: Treating Provider/Extender: Fredrik Rigger, Chioma Weeks in Treatment: 1 Information Obtained from: Patient Chief Complaint Right leg venous leg ulcer Electronic Signature(s) Signed: 06/14/2023 8:20:42 AM By: Allen Derry PA-C Entered By: Allen Derry on 06/14/2023 05:20:42 -------------------------------------------------------------------------------- Debridement Details Patient Name: Date of Service: [name] [name] 06/14/2023 8:00 A M Medical Record Number: [id number] Patient Account Number: [id number] Date of Birth/Sex: Treating RN: [birth year] (52 y.o. [name] Primary Care Provider: Eulogio Bear Other Clinician: Referring Provider: Treating Provider/Extender: Fredrik Rigger, Chioma Weeks in Treatment: 1 Debridement Performed for Assessment: Wound #1 Right,Lateral Lower Leg Performed By: Physician Allen Derry, PA-C The following information was scribed by: Yevonne Pax The information was scribed for: Allen Derry Debridement Type: Debridement Level of Consciousness (Pre-procedure): Awake and Alert Pre-procedure Verification/Time Out Yes - 08:30 Taken: Start Time: 08:30 Pain Control: Lidocaine 4% T opical Solution Percent of Wound Bed Debrided: 100% T Area Debrided (cm): otal 53.58 Tissue and other material debrided: Viable, Non-Viable, Subcutaneous, Skin: Dermis Level: Skin/Subcutaneous Tissue Debridement Description: Excisional Instrument: Curette Bleeding: Moderate Hemostasis Achieved: Pressure [name], [name] ([id number])  [id number] Page 2 of 8 End Time: 08:37 Procedural Pain: 0 Post Procedural Pain: 0 Response to Treatment: Procedure was tolerated well Level of Consciousness (Post- Awake and Alert procedure): Post Debridement Measurements of Total Wound Length: (cm) 10.5 Width: (cm) 6.5 Depth: (cm) 0.1 Volume: (cm) 5.36 Character of Wound/Ulcer Post Debridement: Improved Post Procedure Diagnosis Same as Pre-procedure Electronic Signature(s) Signed: 06/14/2023 1:27:54 PM By: Yevonne Pax RN Signed: 06/15/2023 4:00:09 PM By: Allen Derry PA-C Entered By: Yevonne Pax on 06/14/2023 10:27:54 -------------------------------------------------------------------------------- HPI Details Patient Name: Date of Service: [name] [name] 06/14/2023 8:00 A M Medical Record Number: [id number] Patient Account Number: [id number] Date of Birth/Sex: Treating RN: 01/28/1971 (52 y.o. [name] Primary Care Provider: Eulogio Bear Other Clinician: Referring Provider: Treating Provider/Extender: Fredrik Rigger, Chioma Weeks in Treatment: 1 History of Present Illness HPI Description: Notes from Hospital admission at [facility]: Cellulitis  Venous stasis wound: Presented with fairly significant lower extremity edema and weeping of a wound on her right lower extremity. This has been present for 6 to 8 weeks per the patient. A CT on admission demonstrated cellulitis without abscess or osteomyelitis. The superficial wound culture showed Pseudomonas. There were no risk factors otherwise for Pseudomonas. Overall, this was consistent with venous stasis edema and a mild superimposed cellulitis. She was treated initially with vancomycin for 2 days and then topical mupirocin added. Given the superficial culture showing Pseudomonas, started on IV ciprofloxacin as well as oral cephalexin for MSSA for treatment of her cellulitis. Vancomycin was discontinued. Transitioned from IV to  oral ciprofloxacin on the day of discharge, and continued both ciprofloxacin and cephalexin for a total 7-day course on discharge. Blood cultures negative. Pain was managed with Tylenol and oxycodone as needed, which was also prescribed short term on discharge. Dressing changes performed frequently throughout the day due to significant weeping of her wound, with continuation of dressing changes including Silvadene on discharge. Patient has an appointment with the wound care clinic on November 11 at 1:45 PM Prediabetes (A1c 6.4%). Started on metformin 500mg  XL outpatient. Admission to wound care  center [place], [place]: 06-07-2023 upon evaluation today patient appears to be doing somewhat poorly in regard to her right leg. She tells me that she has been having some issues here with infection. She does tell me that overall the infection has been treated in the hospital initially she was noted to have Pseudomonas and some mild staph. Subsequently she was put on Cipro and Keflex. With that being said she still has quite a bit of drainage as well as erythema she is nearing the completion of her antibiotics they gave oral they were given a total of 7 days of each the Keflex and the Cipro I do not think 7 days is gena be enough I discussed that with her today. With that being said I do believe after reviewing the notes above from [facility] but the patient is on the right track I just think we need to Cipro for a bit longer. Patient does have a history of congestive heart failure, nicotine dependence, COPD, prediabetes, and again chronic venous hypertension. 06-14-2023 upon evaluation today patient appears to be doing well currently in regard to her wound. She has been tolerating the dressing changes without complication. Fortunately I do not see any signs of active infection at this time which is great news. No fevers, chills, nausea, vomiting, or diarrhea. Upon inspection patient's wound bed actually  showed signs of good granulation and epithelization at this point. I actually did perform some debridement clearway necrotic debris today she tolerated that debridement without complication and postdebridement the wound bed is significantly improved. [name], [name] ([id number]) [id number] Page 3 of 8 Electronic Signature(s) Signed: 06/14/2023 10:07:43 AM By: Allen Derry PA-C Entered By: Allen Derry on 06/14/2023 07:07:43 -------------------------------------------------------------------------------- Physical Exam Details Patient Name: Date of Service: [name] [name] 06/14/2023 8:00 A M Medical Record Number: [id number] Patient Account Number: [id number] Date of Birth/Sex: Treating RN: May 04, 1971 (52 y.o. [name] Primary Care Provider: Eulogio Bear Other Clinician: Referring Provider: Treating Provider/Extender: Fredrik Rigger, Chioma Weeks in Treatment: 1 Constitutional Well-nourished and well-hydrated in no acute distress. Respiratory normal breathing without difficulty. Psychiatric this patient is able to make decisions and demonstrates good insight into disease process. Alert and Oriented x 3. pleasant and cooperative. Notes I would recommend that we have the patient going continue to monitor for any signs of infection or worsening. I did perform debridement to clear away the necrotic debris she tolerated that today without complication and postdebridement wound bed looks to be much better. She did have a little bit of bleeding. With that being said I do think that she would benefit from a continuation of therapy although I think [name] [name] may just be a better way to go. Electronic Signature(s) Signed: 06/14/2023 10:08:17 AM By: Allen Derry PA-C Entered By: Allen Derry on 06/14/2023 07:08:16 -------------------------------------------------------------------------------- Physician Orders Details Patient Name: Date of Service: [name]  [name], [name] 06/14/2023 8:00 A M Medical Record Number: [id number] Patient Account Number: [id number] Date of Birth/Sex: Treating RN: 05/09/1971 (52 y.o. [name] Primary Care Provider: Eulogio Bear Other Clinician: Referring Provider: Treating Provider/Extender: Fredrik Rigger, Chioma Weeks in Treatment: 1 The following information was scribed by: Yevonne Pax The information was scribed for: Allen Derry Verbal / Phone Orders: No Diagnosis Coding ICD-10 Coding Code Description QUANAH, SLINGER ([id number]) [id number] Page 4 of 8 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 lower extremity L03.115 Cellulitis of right lower limb L97.812 Non-pressure chronic ulcer of other part of right lower leg with fat  layer exposed R73.03 Prediabetes J44.89 Other specified chronic obstructive pulmonary disease F17.218 Nicotine dependence, cigarettes, with other nicotine-induced disorders I50.42 Chronic combined systolic (congestive) and diastolic (congestive) heart failure Follow-up Appointments Return Appointment in 1 week. Nurse Visit as needed Edema Control - Orders / Instructions Elevate, Exercise Daily and A void Standing for Long Periods of Time. Elevate legs to the level of the heart and pump ankles as often as possible Elevate leg(s) parallel to the floor when sitting. Wound Treatment Wound #1 - Lower Leg Wound Laterality: Right, Lateral Cleanser: Soap and Water 1 x Per Week/30 Days Discharge Instructions: Gently cleanse wound with antibacterial soap, rinse and pat dry prior to dressing wounds Cleanser: Vashe 5.8 (oz) 1 x Per Week/30 Days Discharge Instructions: Use vashe 5.8 (oz) as directed Secondary Dressing: Xtrasorb Large 6x9 (in/in) 1 x Per Week/30 Days Discharge Instructions: Apply to wound as directed. Do not cut. Compression Wrap: Urgo K2 Lite, two layer compression system, regular 1 x Per  Week/30 Days Electronic Signature(s) Signed: 06/14/2023 3:51:45 PM By: Yevonne Pax RN Signed: 06/15/2023 4:00:09 PM By: Allen Derry PA-C Entered By: Yevonne Pax on 06/14/2023 09:40:00 -------------------------------------------------------------------------------- Problem List Details Patient Name: Date of Service: [name] [name] 06/14/2023 8:00 A M Medical Record Number: [id number] Patient Account Number: [id number] Date of Birth/Sex: Treating RN: [birth year] (51 y.o. [name] Primary Care Provider: Eulogio Bear Other Clinician: Referring Provider: Treating Provider/Extender: Fredrik Rigger, Chioma Weeks in Treatment: 1 Active Problems ICD-10 Encounter Code Description Active Date MDM Diagnosis 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 06/07/2023 No Yes lower extremity L03.115 Cellulitis of right lower limb 06/07/2023 No Yes [name], [name] ([id number]) [id number] Page 5 of 8 [phone] Non-pressure chronic ulcer of other part of right lower leg with fat layer 06/07/2023 No Yes exposed R73.03 Prediabetes 06/07/2023 No Yes J44.89 Other specified chronic obstructive pulmonary disease 06/07/2023 No Yes F17.218 Nicotine dependence, cigarettes, with other nicotine-induced disorders 06/07/2023 No Yes I50.42 Chronic combined systolic (congestive) and diastolic (congestive) heart failure 06/07/2023 No Yes Inactive Problems Resolved Problems Electronic Signature(s) Signed: 06/14/2023 8:20:27 AM By: Allen Derry PA-C Entered By: Allen Derry on 06/14/2023 05:20:27 -------------------------------------------------------------------------------- Progress Note Details Patient Name: Date of Service: [name] [name] 06/14/2023 8:00 A M Medical Record Number: [id number] Patient Account Number: [id number] Date of Birth/Sex: Treating RN: January 01, 1971 (52 y.o. [name] Primary Care Provider:  Eulogio Bear Other Clinician: Referring Provider: Treating Provider/Extender: Fredrik Rigger, Chioma Weeks in Treatment: 1 Subjective Chief Complaint Information obtained from Patient Right leg venous leg ulcer History of Present Illness (HPI) Notes from Hospital admission at [facility]: Cellulitis  Venous stasis wound: Presented with fairly significant lower extremity edema and weeping of a wound on her right lower extremity. This has been present for 6 to 8 weeks per the patient. A CT on admission demonstrated cellulitis without abscess or osteomyelitis. The superficial wound culture showed Pseudomonas. There were no risk factors otherwise for Pseudomonas. Overall, this was consistent with venous stasis edema and a mild superimposed cellulitis. She was treated initially with vancomycin for 2 days and then topical mupirocin added. Given the superficial culture showing Pseudomonas, started on IV ciprofloxacin as well as oral cephalexin for MSSA for treatment of her cellulitis. Vancomycin was discontinued. Transitioned from IV to oral ciprofloxacin on the day of discharge, and continued both ciprofloxacin and cephalexin for a total 7-day course on discharge. Blood cultures negative. Pain was managed with Tylenol and oxycodone as needed, which was also prescribed  short term on discharge. Dressing changes performed frequently throughout the day due to significant weeping of her wound, with continuation of dressing changes including Silvadene on discharge. Patient has an appointment with the wound care clinic on November 11 at 1:45 PM Prediabetes (A1c 6.4%). Started on metformin 500mg  XL outpatient. Admission to wound care center [place], [place]: 06-07-2023 upon evaluation today patient appears to be doing somewhat poorly in regard to her right leg. She tells me that she has been having some issues here with infection. She does tell me that overall the infection has been  treated in the hospital initially she was noted to have Pseudomonas and some mild [name], [name] ([id number]) [id number] Page 6 of 8 staph. Subsequently she was put on Cipro and Keflex. With that being said she still has quite a bit of drainage as well as erythema she is nearing the completion of her antibiotics they gave oral they were given a total of 7 days of each the Keflex and the Cipro I do not think 7 days is gena be enough I discussed that with her today. With that being said I do believe after reviewing the notes above from [facility] but the patient is on the right track I just think we need to Cipro for a bit longer. Patient does have a history of congestive heart failure, nicotine dependence, COPD, prediabetes, and again chronic venous hypertension. 06-14-2023 upon evaluation today patient appears to be doing well currently in regard to her wound. She has been tolerating the dressing changes without complication. Fortunately I do not see any signs of active infection at this time which is great news. No fevers, chills, nausea, vomiting, or diarrhea. Upon inspection patient's wound bed actually showed signs of good granulation and epithelization at this point. I actually did perform some debridement clearway necrotic debris today she tolerated that debridement without complication and postdebridement the wound bed is significantly improved. Objective Constitutional Well-nourished and well-hydrated in no acute distress. Vitals Time Taken: 8:04 AM, Height: 61 in, Weight: 330 lbs, BMI: 62.3, Temperature: 97.9 F, Pulse: 74 bpm, Respiratory Rate: 18 breaths/min, Blood Pressure: 143/88 mmHg. Respiratory normal breathing without difficulty. Psychiatric this patient is able to make decisions and demonstrates good insight into disease process. Alert and Oriented x 3. pleasant and cooperative. General Notes: I would recommend that we have the patient going  continue to monitor for any signs of infection or worsening. I did perform debridement to clear away the necrotic debris she tolerated that today without complication and postdebridement wound bed looks to be much better. She did have a little bit of bleeding. With that being said I do think that she would benefit from a continuation of therapy although I think [name] [name] may just be a better way to go. Integumentary (Hair, Skin) Wound #1 status is Open. Original cause of wound was Gradually Appeared. The date acquired was: 04/06/2023. The wound has been in treatment 1 weeks. The wound is located on the Right,Lateral Lower Leg. The wound measures 10.5cm length x 6.5cm width x 0.1cm depth; 53.603cm^2 area and 5.36cm^3 volume. There is Fat Layer (Subcutaneous Tissue) exposed. There is no tunneling or undermining noted. There is a medium amount of serosanguineous drainage noted. There is small (1-33%) red, pink granulation within the wound bed. There is a large (67-100%) amount of necrotic tissue within the wound bed including Adherent Slough. Assessment Active Problems ICD-10 Chronic venous hypertension (idiopathic) with ulcer and inflammation of right lower extremity Cellulitis of right lower limb  Non-pressure chronic ulcer of other part of right lower leg with fat layer exposed Prediabetes Other specified chronic obstructive pulmonary disease Nicotine dependence, cigarettes, with other nicotine-induced disorders Chronic combined systolic (congestive) and diastolic (congestive) heart failure Procedures Wound #1 Pre-procedure diagnosis of Wound #1 is an Atypical located on the Right,Lateral Lower Leg . There was a Excisional Skin/Subcutaneous Tissue Debridement with a total area of 53.58 sq cm performed by Allen Derry, PA-C. With the following instrument(s): Curette to remove Viable and Non-Viable tissue/material. Material removed includes Subcutaneous Tissue and Skin: Dermis and after achieving  pain control using Lidocaine 4% T opical Solution. No specimens were taken. A time out was conducted at 08:30, prior to the start of the procedure. A Moderate amount of bleeding was controlled with Pressure. The procedure was tolerated well with a pain level of 0 throughout and a pain level of 0 following the procedure. Post Debridement Measurements: 10.5cm length x 6.5cm width x 0.1cm depth; 5.36cm^3 volume. Character of Wound/Ulcer Post Debridement is improved. Post procedure Diagnosis Wound #1: Same as Pre-Procedure Plan [name], [name] ([id number]) [id number] Page 7 of 8 Follow-up Appointments: Return Appointment in 1 week. Nurse Visit as needed Edema Control - Orders / Instructions: Elevate, Exercise Daily and Avoid Standing for Long Periods of Time. Elevate legs to the level of the heart and pump ankles as often as possible Elevate leg(s) parallel to the floor when sitting. WOUND #1: - Lower Leg Wound Laterality: Right, Lateral Cleanser: Soap and Water 1 x Per Week/30 Days Discharge Instructions: Gently cleanse wound with antibacterial soap, rinse and pat dry prior to dressing wounds Cleanser: Vashe 5.8 (oz) 1 x Per Week/30 Days Discharge Instructions: Use vashe 5.8 (oz) as directed Secondary Dressing: Xtrasorb Large 6x9 (in/in) 1 x Per Week/30 Days Discharge Instructions: Apply to wound as directed. Do not cut. Com pression Wrap: Urgo K2 Lite, two layer compression system, regular 1 x Per Week/30 Days 1. Would recommend currently that we go ahead and initiate treatment with XtraSorb at this time and the patient is in agreement with the plan. 2. Also can recommend that we have the patient continue to monitor for any signs of infection or worsening. Based on what I am seeing I do believe that we are making headway here towards closure which is good news. I would recommend the Urgo K2 light compression wrap. We will see patient back for reevaluation in 1 week  here in the clinic. If anything worsens or changes patient will contact our office for additional recommendations. Electronic Signature(s) Signed: 06/14/2023 10:08:43 AM By: Allen Derry PA-C Entered By: Allen Derry on 06/14/2023 07:08:43 -------------------------------------------------------------------------------- SuperBill Details Patient Name: Date of Service: [name] [name] 06/14/2023 Medical Record Number: [id number] Patient Account Number: [id number] Date of Birth/Sex: Treating RN: 1971-04-12 (52 y.o. [name] Primary Care Provider: Eulogio Bear Other Clinician: Referring Provider: Treating Provider/Extender: Fredrik Rigger, Chioma Weeks in Treatment: 1 Diagnosis Coding ICD-10 Codes Code Description [phone] Chronic venous hypertension (idiopathic) with ulcer and inflammation of right lower extremity L03.115 Cellulitis of right lower limb L97.812 Non-pressure chronic ulcer of other part of right lower leg with fat layer exposed R73.03 Prediabetes J44.89 Other specified chronic obstructive pulmonary disease F17.218 Nicotine dependence, cigarettes, with other nicotine-induced disorders I50.42 Chronic combined systolic (congestive) and diastolic (congestive) heart failure Facility Procedures : CPT4 Code: [id number] Description: 11042 - DEB SUBQ TISSUE 20 SQ CM/< ICD-10 Diagnosis Description L97.812 Non-pressure chronic ulcer of other part of right lower leg with fat layer expo Modifier:  sed Quantity: 1 : [place], CPT4 Code: [id number] [name] [name] ([id number]) Description: 11045 - DEB SUBQ TISS EA ADDL 20CM ICD-10 Diagnosis Description L97.812 Non-pressure chronic ulcer of other part of right lower leg with fat layer expo [phone] Modifier: sed [id number] Pa Quantity: 2 ge 8 of 8 Physician Procedures : CPT4 Code Description Modifier 11042 11042 - WC PHYS SUBQ TISS 20 SQ CM ICD-10 Diagnosis Description L97.812 Non-pressure chronic ulcer of other  part of right lower leg with fat layer exposed Quantity: 1 : [phone] 11045 - WC PHYS SUBQ TISS EA ADDL 20 CM ICD-10 Diagnosis Description L97.812 Non-pressure chronic ulcer of other part of right lower leg with fat layer exposed Quantity: 2 Electronic Signature(s) Signed: 06/14/2023 10:08:52 AM By: Allen Derry PA-C Entered By: Allen Derry on 06/14/2023 07:08:52

## 2023-06-14 NOTE — Progress Notes (Addendum)
VIE, DECELLES ([id number]) [id number] Page 1 of 9 Visit Report for 06/14/2023 Arrival Information Details Patient Name: Date of Service: [name] [name] 06/14/2023 8:00 A M Medical Record Number: [id number] Patient Account Number: [id number] Date of Birth/Sex: Treating RN: [birth year] (52 y.o. [name] [name] Primary Care [name] [name]: [name] [name] Other Clinician: Referring [name] [name]: Treating [name] [name]/Extender: [name] [name], [name] [name] in Treatment: 1 Visit Information History Since Last Visit Added or deleted any medications: No Patient Arrived: Ambulatory Any new allergies or adverse reactions: No Arrival Time: 08:01 Had a fall or experienced change in No Accompanied By: friend activities of daily living that may affect Transfer Assistance: None risk of falls: Patient Identification Verified: Yes Signs or symptoms of abuse/neglect since last visito No Secondary Verification Process Completed: Yes Hospitalized since last visit: No Patient Requires Transmission-Based Precautions: No Implantable device outside of the clinic excluding No Patient Has Alerts: Yes cellular tissue based products placed in the center Patient Alerts: Prediabetic since last visit: Has Dressing in Place as Prescribed: Yes Has Compression in Place as Prescribed: Yes Pain Present Now: Yes Electronic Signature(s) Signed: 06/14/2023 8:25:20 AM By: [name] Pax RN Entered By: [name] [name] on 06/14/2023 05:04:17 -------------------------------------------------------------------------------- Clinic Level of Care Assessment Details Patient Name: Date of Service: [name] [name] [name] 06/14/2023 8:00 A M Medical Record Number: [id number] Patient Account Number: [id number] Date of Birth/Sex: Treating RN: 10/23/1970 (52 y.o. [name] [name] Primary Care [name] [name]: [name] [name] Other Clinician: Referring [name] [name]: Treating [name] [name]/Extender: [name] [name],  [name] [name] in Treatment: 1 Clinic Level of Care Assessment Items TOOL 1 Quantity Score []  - 0 Use when EandM and Procedure is performed on INITIAL visit ASSESSMENTS - Nursing Assessment / Reassessment []  - 0 General Physical Exam (combine w/ comprehensive assessment (listed just below) when performed on new pt. evals) []  - 0 Comprehensive Assessment (HX, ROS, Risk Assessments, Wounds Hx, etc.) [name] [name] ([id number]) [id number] Page 2 of 9 ASSESSMENTS - Wound and Skin Assessment / Reassessment []  - 0 Dermatologic / Skin Assessment (not related to wound area) ASSESSMENTS - Ostomy and/or Continence Assessment and Care []  - 0 Incontinence Assessment and Management []  - 0 Ostomy Care Assessment and Management (repouching, etc.) PROCESS - Coordination of Care []  - 0 Simple Patient / Family Education for ongoing care []  - 0 Complex (extensive) Patient / Family Education for ongoing care []  - 0 Staff obtains Chiropractor, Records, T Results / Process Orders est []  - 0 Staff telephones HHA, Nursing Homes / Clarify orders / etc []  - 0 Routine Transfer to another Facility (non-emergent condition) []  - 0 Routine Hospital Admission (non-emergent condition) []  - 0 New Admissions / Manufacturing engineer / Ordering NPWT Apligraf, etc. , []  - 0 Emergency Hospital Admission (emergent condition) PROCESS - Special Needs []  - 0 Pediatric / Minor Patient Management []  - 0 Isolation Patient Management []  - 0 Hearing / Language / Visual special needs []  - 0 Assessment of Community assistance (transportation, D/C planning, etc.) []  - 0 Additional assistance / Altered mentation []  - 0 Support Surface(s) Assessment (bed, cushion, seat, etc.) INTERVENTIONS - Miscellaneous []  - 0 External ear exam []  - 0 Patient Transfer (multiple staff / Nurse, adult / Similar devices) []  - 0 Simple Staple / Suture removal (25 or less) []  - 0 Complex Staple / Suture  removal (26 or more) []  - 0 Hypo/Hyperglycemic Management (do not check if billed separately) []  - 0 Ankle / Brachial Index (ABI) - do not check if billed separately Has the patient been  seen at the hospital within the last three years: Yes Total Score: 0 Level Of Care: ____ Electronic Signature(s) Signed: 06/14/2023 3:51:45 PM By: [name] Pax RN Entered By: [name] [name] on 06/14/2023 05:36:41 -------------------------------------------------------------------------------- Encounter Discharge Information Details Patient Name: Date of Service: [name] [name] [name] 06/14/2023 8:00 A M Medical Record Number: [id number] Patient Account Number: [id number] Date of Birth/Sex: Treating RN: Mar 12, 1971 (52 y.o. [name] [name] Primary Care Kellon Chalk: [name] [name] Other Clinician: Referring Anahi Belmar: Treating Kaydon Creedon/Extender: [name] [name] Solvang, [name] [name] ([id number]) [id number] Page 3 of 9 [name] in Treatment: 1 Encounter Discharge Information Items Post Procedure Vitals Discharge Condition: Stable Temperature (F): 97.9 Ambulatory Status: Ambulatory Pulse (bpm): 74 Discharge Destination: Home Respiratory Rate (breaths/min): 18 Transportation: Private Auto Blood Pressure (mmHg): 143/88 Accompanied By: friend Schedule Follow-up Appointment: Yes Clinical Summary of Care: Electronic Signature(s) Signed: 06/14/2023 3:51:45 PM By: [name] Pax RN Entered By: [name] [name] on 06/14/2023 05:38:44 -------------------------------------------------------------------------------- Lower Extremity Assessment Details Patient Name: Date of Service: [name] [name] [name] 06/14/2023 8:00 A M Medical Record Number: [id number] Patient Account Number: [id number] Date of Birth/Sex: Treating RN: 11-30-1970 (52 y.o. [name] [name] Primary Care [name] [name]: [name] [name] Other Clinician: Referring [name] [name]: Treating [name] [name]/Extender: [name] [name],  [name] [name] in Treatment: 1 Edema Assessment Assessed: [Left: No] [Right: No] Edema: [Left: Ye] [Right: s] Calf Left: Right: Point of Measurement: 33 cm From Medial Instep 50 cm Ankle Left: Right: Point of Measurement: 12 cm From Medial Instep 23 cm Knee To Floor Left: Right: From Medial Instep 40 cm Vascular Assessment Pulses: Dorsalis Pedis Palpable: [Right:Yes] Extremity colors, hair growth, and conditions: Hair Growth on Extremity: [Right:No] Temperature of Extremity: [Right:Warm] Capillary Refill: [Right:< 3 seconds] Dependent Rubor: [Right:No] Blanched when Elevated: [Right:No No] Toe Nail Assessment Left: Right: Thick: No Discolored: No Deformed: No Pasternak, Joan ([id number]) [id number] Page 4 of 9 Improper Length and Hygiene: No Electronic Signature(s) Signed: 06/14/2023 8:25:20 AM By: [name] Pax RN Entered By: [name] [name] on 06/14/2023 05:17:43 -------------------------------------------------------------------------------- Multi Wound Chart Details Patient Name: Date of Service: [name] [name] [name] 06/14/2023 8:00 A M Medical Record Number: [id number] Patient Account Number: [id number] Date of Birth/Sex: Treating RN: 06-18-1971 (52 y.o. [name] [name] Primary Care Williemae Muriel: [name] [name] Other Clinician: Referring Jaquawn Saffran: Treating Sahily Biddle/Extender: [name] [name], [name] [name] in Treatment: 1 Vital Signs Height(in): 61 Pulse(bpm): 74 Weight(lbs): 330 Blood Pressure(mmHg): 143/88 Body Mass Index(BMI): 62.3 Temperature(F): 97.9 Respiratory Rate(breaths/min): 18 [1:Photos:] [N/A:N/A] Right, Lateral Lower Leg N/A N/A Wound Location: Gradually Appeared N/A N/A Wounding Event: Atypical N/A N/A Primary Etiology: Chronic Obstructive Pulmonary N/A N/A Comorbid History: Disease (COPD), Sleep Apnea, Congestive Heart Failure, Neuropathy 04/06/2023 N/A N/A Date Acquired: 1 N/A N/A [name] of Treatment: Open N/A  N/A Wound Status: No N/A N/A Wound Recurrence: 10.5x6.5x0.1 N/A N/A Measurements L x W x D (cm) 53.603 N/A N/A A (cm) : rea 5.36 N/A N/A Volume (cm) : -70.60% N/A N/A % Reduction in Area: -70.60% N/A N/A % Reduction in Volume: Full Thickness Without Exposed N/A N/A Classification: Support Structures Medium N/A N/A Exudate Amount: Serosanguineous N/A N/A Exudate Type: red, brown N/A N/A Exudate Color: Small (1-33%) N/A N/A Granulation Amount: Red, Pink N/A N/A Granulation Quality: Large (67-100%) N/A N/A Necrotic Amount: Fat Layer (Subcutaneous Tissue): Yes N/A N/A Exposed Structures: Fascia: No Tendon: No Muscle: No Joint: No Bone: No Dillehay, Eldene ([id number]) [id number] Page 5 of 9 Treatment Notes Electronic Signature(s) Signed: 06/14/2023 8:25:20 AM By: [name] Pax RN Entered By: [name] [name] on 06/14/2023 05:17:51 -------------------------------------------------------------------------------- Multi-Disciplinary Care  Plan Details Patient Name: Date of Service: [name] [name] [name] 06/14/2023 8:00 A M Medical Record Number: [id number] Patient Account Number: [id number] Date of Birth/Sex: Treating RN: Mar 21, 1971 (52 y.o. [name] [name] Primary Care Tay Whitwell: [name] [name] Other Clinician: Referring Tallin Hart: Treating Ekin Pilar/Extender: [name] [name], [name] [name] in Treatment: 1 Active Inactive Orientation to the Wound Care Program Nursing Diagnoses: Knowledge deficit related to the wound healing center program Goals: Patient/caregiver will verbalize understanding of the Wound Healing Center Program Date Initiated: 06/07/2023 Target Resolution Date: 06/14/2023 Goal Status: Active Interventions: Provide education on orientation to the wound center Notes: Wound/Skin Impairment Nursing Diagnoses: Impaired tissue integrity Knowledge deficit related to smoking impact on wound healing Knowledge deficit related to  ulceration/compromised skin integrity Goals: Patient will demonstrate a reduced rate of smoking or cessation of smoking Date Initiated: 06/07/2023 Target Resolution Date: 07/08/2023 Goal Status: Active Patient/caregiver will verbalize understanding of skin care regimen Date Initiated: 06/07/2023 Target Resolution Date: 07/07/2023 Goal Status: Active Ulcer/skin breakdown will have a volume reduction of 30% by week 4 Date Initiated: 06/07/2023 Target Resolution Date: 07/07/2023 Goal Status: Active Ulcer/skin breakdown will have a volume reduction of 50% by week 8 Date Initiated: 06/07/2023 Target Resolution Date: 08/07/2023 Goal Status: Active Ulcer/skin breakdown will have a volume reduction of 80% by week 12 Date Initiated: 06/07/2023 Target Resolution Date: 09/07/2023 Goal Status: Active Interventions: Assess patient/caregiver ability to obtain necessary supplies Assess patient/caregiver ability to perform ulcer/skin care regimen upon admission and as needed [place], [name] [name] ([id number]) [id number] Page 6 of 9 Assess ulceration(s) every visit Provide education on smoking Provide education on ulcer and skin care Treatment Activities: Skin care regimen initiated : 06/07/2023 Notes: Electronic Signature(s) Signed: 06/14/2023 8:25:20 AM By: [name] Pax RN Entered By: [name] [name] on 06/14/2023 05:18:41 -------------------------------------------------------------------------------- Pain Assessment Details Patient Name: Date of Service: [name] [name] [name] 06/14/2023 8:00 A M Medical Record Number: [id number] Patient Account Number: [id number] Date of Birth/Sex: Treating RN: Nov 29, 1970 (52 y.o. [name] [name] Primary Care [name] Devins: [name] [name] Other Clinician: Referring Deadra Diggins: Treating Saima Monterroso/Extender: [name] [name], [name] [name] in Treatment: 1 Active Problems Location of Pain Severity and Description of Pain Patient Has Paino  Yes Site Locations With Dressing Change: Yes Duration of the Pain. Constant / Intermittento Constant Rate the pain. Current Pain Level: 5 Worst Pain Level: 7 Least Pain Level: 5 Character of Pain Describe the Pain: Burning, Shooting Pain Management and Medication Current Pain Management: Medication: Yes Cold Application: No Rest: Yes Massage: No Activity: No T.E.N.S.: No Heat Application: No Leg drop or elevation: No Is the Current Pain Management Adequate: Inadequate How does your wound impact your activities of daily livingo Sleep: Yes Bathing: No Appetite: No Relationship With Others: No Bladder Continence: No Emotions: No Bowel Continence: No Work: No LISBETH, CERTO ([id number]) [id number] Page 7 of 9 Toileting: No Drive: No Dressing: No Hobbies: No Electronic Signature(s) Signed: 06/14/2023 8:25:20 AM By: [name] Pax RN Entered By: [name] [name] on 06/14/2023 05:05:30 -------------------------------------------------------------------------------- Patient/Caregiver Education Details Patient Name: Date of Service: [name] [name] [name] 11/18/2024andnbsp8:00 A M Medical Record Number: [id number] Patient Account Number: [id number] Date of Birth/Gender: Treating RN: 1970/09/14 (52 y.o. [name] [name] Primary Care Physician: [name] [name] Other Clinician: Referring Physician: Treating Physician/Extender: [name] [name], [name] [name] in Treatment: 1 Education Assessment Education Provided To: Patient Education Topics Provided Wound Debridement: Handouts: Other: defined slough tissue Methods: Explain/Verbal Responses: State content correctly Electronic Signature(s) Signed: 06/14/2023 8:25:20 AM By: [name] Pax RN Entered By: [name] [name] on 06/14/2023 05:19:41 --------------------------------------------------------------------------------  Wound Assessment Details Patient Name: Date of Service: [name] [name] [name] 06/14/2023  8:00 A M Medical Record Number: [id number] Patient Account Number: [id number] Date of Birth/Sex: Treating RN: [birth year] (52 y.o. [name] [name] Primary Care Jaquavious Mercer: [name] [name] Other Clinician: Referring Cael Worth: Treating Kamala Kolton/Extender: [name] [name], [name] [name] in Treatment: 1 Wound Status Wound Number: 1 Primary Atypical Etiology: Wound Location: Right, Lateral Lower Leg Wound Open Wounding Event: Gradually Yesenia Richarson, [name] [name] ([id number]) [id number] Page 8 of 9 Wounding Event: Gradually Appeared Status: Date Acquired: 04/06/2023 Comorbid Chronic Obstructive Pulmonary Disease (COPD), Sleep Apnea, [name] Of Treatment: 1 History: Congestive Heart Failure, Neuropathy Clustered Wound: No Photos Wound Measurements Length: (cm) 10.5 Width: (cm) 6.5 Depth: (cm) 0.1 Area: (cm) 53.603 Volume: (cm) 5.36 % Reduction in Area: -70.6% % Reduction in Volume: -70.6% Tunneling: No Undermining: No Wound Description Classification: Full Thickness Without Exposed Support Structures Exudate Amount: Medium Exudate Type: Serosanguineous Exudate Color: red, brown Foul Odor After Cleansing: No Slough/Fibrino Yes Wound Bed Granulation Amount: Small (1-33%) Exposed Structure Granulation Quality: Red, Pink Fascia Exposed: No Necrotic Amount: Large (67-100%) Fat Layer (Subcutaneous Tissue) Exposed: Yes Necrotic Quality: Adherent Slough Tendon Exposed: No Muscle Exposed: No Joint Exposed: No Bone Exposed: No Treatment Notes Wound #1 (Lower Leg) Wound Laterality: Right, Lateral Cleanser Soap and Water Discharge Instruction: Gently cleanse wound with antibacterial soap, rinse and pat dry prior to dressing wounds Vashe 5.8 (oz) Discharge Instruction: Use vashe 5.8 (oz) as directed Peri-Wound Care Topical Primary Dressing Secondary Dressing Xtrasorb Large 6x9 (in/in) Discharge Instruction: Apply to wound as directed. Do not  cut. Secured With Compression Wrap Urgo K2 Lite, two layer compression system, regular Compression Stockings Add-Ons Electronic Signature(s) Signed: 06/14/2023 8:25:20 AM By: [name] Pax RN Entered By: [name] [name] on 06/14/2023 05:16:18 [name] [name] ([id number]) [id number] Page 9 of 9 -------------------------------------------------------------------------------- Vitals Details Patient Name: Date of Service: [name] [name], Malayiah 06/14/2023 8:00 A M Medical Record Number: [id number] Patient Account Number: [id number] Date of Birth/Sex: Treating RN: 29-Nov-1970 (51 y.o. [name] [name] Primary Care Alishba Naples: [name] [name] Other Clinician: Referring Nikea Settle: Treating Nixie Laube/Extender: [name] [name], [name] [name] in Treatment: 1 Vital Signs Time Taken: 08:04 Temperature (F): 97.9 Height (in): 61 Pulse (bpm): 74 Weight (lbs): 330 Respiratory Rate (breaths/min): 18 Body Mass Index (BMI): 62.3 Blood Pressure (mmHg): 143/88 Reference Range: 80 - 120 mg / dl Electronic Signature(s) Signed: 06/14/2023 8:25:20 AM By: [name] Pax RN Entered By: [name] [name] on 06/14/2023 05:04:39

## 2023-06-17 DIAGNOSIS — I87331 Chronic venous hypertension (idiopathic) with ulcer and inflammation of right lower extremity: Secondary | ICD-10-CM | POA: Diagnosis not present

## 2023-06-17 NOTE — Progress Notes (Addendum)
KASHISH, AULD ([id number]) [id number] Page 1 of 4 Visit Report for 06/17/2023 Arrival Information Details Patient Name: Date of Service: [name] [name] 06/17/2023 4:00 PM Medical Record Number: [id number] Patient Account Number: [id number] Date of Birth/Sex: Treating RN: August 12, 1970 (52 y.o. [name] Primary Care Shevawn Langenberg: Eulogio Bear Other Clinician: Referring Lavilla Delamora: Treating Rashan Patient/Extender: Fredrik Rigger, Chioma Weeks in Treatment: 1 Visit Information History Since Last Visit Added or deleted any medications: No Patient Arrived: Ambulatory Any new allergies or adverse reactions: No Arrival Time: 15:33 Had a fall or experienced change in No Accompanied By: caregiver activities of daily living that may affect Transfer Assistance: None risk of falls: Patient Identification Verified: Yes Signs or symptoms of abuse/neglect since last visito No Secondary Verification Process Completed: Yes Hospitalized since last visit: No Patient Requires Transmission-Based Precautions: No Implantable device outside of the clinic excluding No Patient Has Alerts: Yes cellular tissue based products placed in the center Patient Alerts: Prediabetic since last visit: Has Dressing in Place as Prescribed: Yes Has Compression in Place as Prescribed: Yes Pain Present Now: No Electronic Signature(s) Signed: 06/17/2023 3:34:07 PM By: Yevonne Pax RN Entered By: Yevonne Pax on 06/17/2023 15:34:07 -------------------------------------------------------------------------------- Clinic Level of Care Assessment Details Patient Name: Date of Service: [name] [name] 06/17/2023 4:00 PM Medical Record Number: [id number] Patient Account Number: [id number] Date of Birth/Sex: Treating RN: 03-Jun-1971 (52 y.o. [name] Primary Care Draylen Lobue: Eulogio Bear Other Clinician: Referring Laymon Stockert: Treating Gabryel Talamo/Extender: Fredrik Rigger,  Chioma Weeks in Treatment: 1 Clinic Level of Care Assessment Items TOOL 1 Quantity Score []  - 0 Use when EandM and Procedure is performed on INITIAL visit ASSESSMENTS - Nursing Assessment / Reassessment []  - 0 General Physical Exam (combine w/ comprehensive assessment (listed just below) when performed on new pt. evals) []  - 0 Comprehensive Assessment (HX, ROS, Risk Assessments, Wounds Hx, etc.) [name] [name] ([id number]) [id number] Page 2 of 4 ASSESSMENTS - Wound and Skin Assessment / Reassessment []  - 0 Dermatologic / Skin Assessment (not related to wound area) ASSESSMENTS - Ostomy and/or Continence Assessment and Care []  - 0 Incontinence Assessment and Management []  - 0 Ostomy Care Assessment and Management (repouching, etc.) PROCESS - Coordination of Care []  - 0 Simple Patient / Family Education for ongoing care []  - 0 Complex (extensive) Patient / Family Education for ongoing care []  - 0 Staff obtains Chiropractor, Records, T Results / Process Orders est []  - 0 Staff telephones HHA, Nursing Homes / Clarify orders / etc []  - 0 Routine Transfer to another Facility (non-emergent condition) []  - 0 Routine Hospital Admission (non-emergent condition) []  - 0 New Admissions / Manufacturing engineer / Ordering NPWT Apligraf, etc. , []  - 0 Emergency Hospital Admission (emergent condition) PROCESS - Special Needs []  - 0 Pediatric / Minor Patient Management []  - 0 Isolation Patient Management []  - 0 Hearing / Language / Visual special needs []  - 0 Assessment of Community assistance (transportation, D/C planning, etc.) []  - 0 Additional assistance / Altered mentation []  - 0 Support Surface(s) Assessment (bed, cushion, seat, etc.) INTERVENTIONS - Miscellaneous []  - 0 External ear exam []  - 0 Patient Transfer (multiple staff / Nurse, adult / Similar devices) []  - 0 Simple Staple / Suture removal (25 or less) []  - 0 Complex Staple / Suture  removal (26 or more) []  - 0 Hypo/Hyperglycemic Management (do not check if billed separately) []  - 0 Ankle / Brachial Index (ABI) - do not check if billed separately Has the patient been seen at  the hospital within the last three years: Yes Total Score: 0 Level Of Care: ____ Electronic Signature(s) Unsigned Entered By: Yevonne Pax on 06/23/2023 10:02:22 -------------------------------------------------------------------------------- Compression Therapy Details Patient Name: Date of Service: [name] [name] 06/17/2023 4:00 PM Medical Record Number: [id number] Patient Account Number: [id number] Date of Birth/Sex: Treating RN: 05-04-1971 (52 y.o. [name] Primary Care Akyah Lagrange: Eulogio Bear Other Clinician: [name], [name] ([id number]) [id number] Page 3 of 4 Referring Nesta Scaturro: Treating Seth Friedlander/Extender: Fredrik Rigger, Chioma Weeks in Treatment: 1 Compression Therapy Performed for Wound Assessment: Wound #1 Right,Lateral Lower Leg Performed By: Clinician Yevonne Pax, RN Compression Type: Double Layer Electronic Signature(s) Signed: 06/23/2023 10:01:59 AM By: Yevonne Pax RN Entered By: Yevonne Pax on 06/23/2023 10:01:59 -------------------------------------------------------------------------------- Encounter Discharge Information Details Patient Name: Date of Service: [name] [name] 06/17/2023 4:00 PM Medical Record Number: [id number] Patient Account Number: [id number] Date of Birth/Sex: Treating RN: 26-Jul-1971 (52 y.o. [name] Primary Care Jearld Hemp: Eulogio Bear Other Clinician: Referring Kenta Laster: Treating Jabbar Palmero/Extender: Fredrik Rigger, Chioma Weeks in Treatment: 1 Encounter Discharge Information Items Discharge Condition: Stable Ambulatory Status: Ambulatory Discharge Destination: Home Transportation: Private Auto Accompanied By: self Schedule Follow-up Appointment: Yes Clinical Summary of  Care: Electronic Signature(s) Signed: 06/17/2023 3:37:18 PM By: Yevonne Pax RN Entered By: Yevonne Pax on 06/17/2023 15:37:17 -------------------------------------------------------------------------------- Wound Assessment Details Patient Name: Date of Service: [name] [name] 06/17/2023 4:00 PM Medical Record Number: [id number] Patient Account Number: [id number] Date of Birth/Sex: Treating RN: 1970-10-24 (52 y.o. [name] Primary Care Mohannad Olivero: Eulogio Bear Other Clinician: Referring Cherrelle Plante: Treating Shantay Sonn/Extender: Fredrik Rigger, Chioma Weeks in Treatment: 1 Wound Status Wound Number: 1 Primary Atypical Etiology: Wound Location: Right, Lateral Lower Leg Wound Open Wounding Event: Gradually Appeared Status: Date Acquired: 04/06/2023 [name], [name] ([id number]) [id number] Page 4 of 4 Date Acquired: 04/06/2023 Comorbid Chronic Obstructive Pulmonary Disease (COPD), Sleep Apnea, Weeks Of Treatment: 1 History: Congestive Heart Failure, Neuropathy Clustered Wound: No Wound Measurements Length: (cm) 10.5 Width: (cm) 6.5 Depth: (cm) 0.1 Area: (cm) 53.603 Volume: (cm) 5.36 % Reduction in Area: -70.6% % Reduction in Volume: -70.6% Tunneling: No Undermining: No Wound Description Classification: Full Thickness Without Exposed Support Exudate Amount: Medium Exudate Type: Serosanguineous Exudate Color: red, brown Structures Foul Odor After Cleansing: No Slough/Fibrino Yes Wound Bed Granulation Amount: Small (1-33%) Exposed Structure Granulation Quality: Red, Pink Fascia Exposed: No Necrotic Amount: Large (67-100%) Fat Layer (Subcutaneous Tissue) Exposed: Yes Necrotic Quality: Adherent Slough Tendon Exposed: No Muscle Exposed: No Joint Exposed: No Bone Exposed: No Electronic Signature(s) Signed: 06/17/2023 3:35:16 PM By: Yevonne Pax RN Entered By: Yevonne Pax on 06/17/2023 15:35:16

## 2023-06-18 NOTE — Progress Notes (Signed)
LEIGHLANI, BOWLEN ([id number]) [id number] Page 1 of 1 Visit Report for 06/17/2023 Physician Orders Details Patient Name: Date of Service: [name], [name] 06/17/2023 4:00 PM Medical Record Number: [id number] Patient Account Number: [id number] Date of Birth/Sex: Treating RN: April 16, 1971 (52 y.o. [name] Primary Care Provider: Eulogio Bear Other Clinician: Referring Provider: Treating Provider/Extender: Fredrik Rigger, Chioma Weeks in Treatment: 1 Verbal / Phone Orders: No Diagnosis Coding Follow-up Appointments Return Appointment in 1 week. Nurse Visit as needed Edema Control - Orders / Instructions Elevate, Exercise Daily and A void Standing for Long Periods of Time. Elevate legs to the level of the heart and pump ankles as often as possible Elevate leg(s) parallel to the floor when sitting. Wound Treatment Wound #1 - Lower Leg Wound Laterality: Right, Lateral Cleanser: Soap and Water 1 x Per Week/30 Days Discharge Instructions: Gently cleanse wound with antibacterial soap, rinse and pat dry prior to dressing wounds Cleanser: Vashe 5.8 (oz) 1 x Per Week/30 Days Discharge Instructions: Use vashe 5.8 (oz) as directed Secondary Dressing: Xtrasorb Large 6x9 (in/in) 1 x Per Week/30 Days Discharge Instructions: Apply to wound as directed. Do not cut. Compression Wrap: Urgo K2 Lite, two layer compression system, regular 1 x Per Week/30 Days Electronic Signature(s) Signed: 06/17/2023 3:36:07 PM By: Yevonne Pax RN Signed: 06/17/2023 6:01:43 PM By: Allen Derry PA-C Entered By: Yevonne Pax on 06/17/2023 12:36:07

## 2023-06-21 ENCOUNTER — Encounter: Payer: 59 | Admitting: Physician Assistant

## 2023-06-21 DIAGNOSIS — I87331 Chronic venous hypertension (idiopathic) with ulcer and inflammation of right lower extremity: Secondary | ICD-10-CM | POA: Diagnosis not present

## 2023-06-21 NOTE — Progress Notes (Addendum)
[name], [name] ([id number]) [id number] Page 1 of 9 Visit Report for 06/21/2023 Arrival Information Details Patient Name: Date of Service: [name] [name] 06/21/2023 2:00 PM Medical Record Number: [id number] Patient Account Number: [id number] Date of Birth/Sex: Treating RN: 06-Oct-1970 (52 y.o. [name] [name] Primary Care [name] [name]: [name] [name] Other Clinician: Referring [name] [name]: Treating [name] [name]/Extender: [name] [name], [name] [name] in Treatment: 2 Visit Information History Since Last Visit Added or deleted any medications: No Patient Arrived: Ambulatory Any new allergies or adverse reactions: No Arrival Time: 13:54 Had a fall or experienced change in No Accompanied By: family activities of daily living that may affect Transfer Assistance: None risk of falls: Patient Identification Verified: Yes Hospitalized since last visit: No Secondary Verification Process Completed: Yes Has Dressing in Place as Prescribed: Yes Patient Requires Transmission-Based Precautions: No Has Compression in Place as Prescribed: Yes Patient Has Alerts: Yes Pain Present Now: Yes Patient Alerts: Prediabetic Electronic Signature(s) Signed: 06/21/2023 4:18:14 PM By: [name] [name] Entered By: [name] [name] on 06/21/2023 10:56:09 -------------------------------------------------------------------------------- Clinic Level of Care Assessment Details Patient Name: Date of Service: [name] [name] 06/21/2023 2:00 PM Medical Record Number: [id number] Patient Account Number: [id number] Date of Birth/Sex: Treating RN: [birth year] (52 y.o. [name] [name] Primary Care [name] [name]: [name] [name] Other Clinician: Referring [name] [name]: Treating [name] [name]/Extender: [name] [name], [name] [name] in Treatment: 2 Clinic Level of Care Assessment Items TOOL 1 Quantity Score []  - 0 Use when EandM and Procedure is performed on INITIAL visit ASSESSMENTS -  Nursing Assessment / Reassessment []  - 0 General Physical Exam (combine w/ comprehensive assessment (listed just below) when performed on new pt. evals) []  - 0 Comprehensive Assessment (HX, ROS, Risk Assessments, Wounds Hx, etc.) ASSESSMENTS - Wound and Skin Assessment / Reassessment []  - 0 Dermatologic / Skin Assessment (not related to wound area) Needles, [name] ([id number]) [id number] Page 2 of 9 ASSESSMENTS - Ostomy and/or Continence Assessment and Care []  - 0 Incontinence Assessment and Management []  - 0 Ostomy Care Assessment and Management (repouching, etc.) PROCESS - Coordination of Care []  - 0 Simple Patient / Family Education for ongoing care []  - 0 Complex (extensive) Patient / Family Education for ongoing care []  - 0 Staff obtains Chiropractor, Records, T Results / Process Orders est []  - 0 Staff telephones HHA, Nursing Homes / Clarify orders / etc []  - 0 Routine Transfer to another Facility (non-emergent condition) []  - 0 Routine Hospital Admission (non-emergent condition) []  - 0 New Admissions / Manufacturing engineer / Ordering NPWT Apligraf, etc. , []  - 0 Emergency Hospital Admission (emergent condition) PROCESS - Special Needs []  - 0 Pediatric / Minor Patient Management []  - 0 Isolation Patient Management []  - 0 Hearing / Language / Visual special needs []  - 0 Assessment of Community assistance (transportation, D/C planning, etc.) []  - 0 Additional assistance / Altered mentation []  - 0 Support Surface(s) Assessment (bed, cushion, seat, etc.) INTERVENTIONS - Miscellaneous []  - 0 External ear exam []  - 0 Patient Transfer (multiple staff / Nurse, adult / Similar devices) []  - 0 Simple Staple / Suture removal (25 or less) []  - 0 Complex Staple / Suture removal (26 or more) []  - 0 Hypo/Hyperglycemic Management (do not check if billed separately) []  - 0 Ankle / Brachial Index (ABI) - do not check if billed separately Has the  patient been seen at the hospital within the last three years: Yes Total Score: 0 Level Of Care: ____ Electronic Signature(s) Signed: 06/21/2023 4:18:14 PM By: [name] [name] Entered By: [name] [name] on  06/21/2023 11:51:26 -------------------------------------------------------------------------------- Encounter Discharge Information Details Patient Name: Date of Service: [name] [name] 06/21/2023 2:00 PM Medical Record Number: [id number] Patient Account Number: [id number] Date of Birth/Sex: Treating RN: [birth year] (53 y.o. [name] [name] Primary Care [name] [name]: [name] [name] Other Clinician: Referring [name] [name]: Treating [name] [name]/Extender: [name] [name], [name] [name] in Treatment: 2 Encounter Discharge Information Items Post Procedure [name] [name] ([id number]) [id number] Page 3 of 9 Discharge Condition: Stable Temperature (F): 97.5 Ambulatory Status: Ambulatory Pulse (bpm): 57 Discharge Destination: Home Respiratory Rate (breaths/min): 18 Transportation: Private Auto Blood Pressure (mmHg): 126/51 Accompanied By: family Schedule Follow-up Appointment: Yes Clinical Summary of Care: Electronic Signature(s) Signed: 06/21/2023 4:18:14 PM By: [name] [name] Entered By: [name] [name] on 06/21/2023 11:53:11 -------------------------------------------------------------------------------- Lower Extremity Assessment Details Patient Name: Date of Service: [name] [name] 06/21/2023 2:00 PM Medical Record Number: [id number] Patient Account Number: [id number] Date of Birth/Sex: Treating RN: [birth year] (52 y.o. [name] [name] Primary Care Yahsir Wickens: [name] [name] Other Clinician: Referring Lamya Lausch: Treating Henryk Ursin/Extender: [name] [name], [name] [name] in Treatment: 2 Edema Assessment Assessed: [Left: No] [Right: No] Edema: [Left: Ye] [Right: s] Calf Left: Right: Point of Measurement: 33 cm From Medial  Instep 47.5 cm Ankle Left: Right: Point of Measurement: 12 cm From Medial Instep 25 cm Vascular Assessment Pulses: Dorsalis Pedis Palpable: [Right:Yes] Extremity colors, hair growth, and conditions: Extremity Color: [Right:Red] Hair Growth on Extremity: [Right:Yes] Temperature of Extremity: [Right:Warm < 3 seconds] Toe Nail Assessment Left: Right: Thick: No Discolored: No Deformed: No Improper Length and Hygiene: No Electronic Signature(s) Signed: 06/21/2023 4:18:14 PM By: [name] [name] Entered By: [name] [name] on 06/21/2023 11:06:13 Daryel Gerald ([id number]) [id number] Page 4 of 9 -------------------------------------------------------------------------------- Multi Wound Chart Details Patient Name: Date of Service: [name] [name] 06/21/2023 2:00 PM Medical Record Number: [id number] Patient Account Number: [id number] Date of Birth/Sex: Treating RN: 1971-06-04 (53 y.o. [name] [name] Primary Care Deven Audi: [name] [name] Other Clinician: Referring Lennin Osmond: Treating Dianah Pruett/Extender: [name] [name], [name] [name] in Treatment: 2 Vital Signs Height(in): 61 Pulse(bpm): 57 Weight(lbs): 330 Blood Pressure(mmHg): 126/51 Body Mass Index(BMI): 62.3 Temperature(F): 97.5 Respiratory Rate(breaths/min): 18 [1:Photos:] [N/A:N/A] Right, Lateral Lower Leg N/A N/A Wound Location: Gradually Appeared N/A N/A Wounding Event: Atypical N/A N/A Primary Etiology: Chronic Obstructive Pulmonary N/A N/A Comorbid History: Disease (COPD), Sleep Apnea, Congestive Heart Failure, Neuropathy 04/06/2023 N/A N/A Date Acquired: 2 N/A N/A [name] of Treatment: Open N/A N/A Wound Status: No N/A N/A Wound Recurrence: 4.8x4.5x0.1 N/A N/A Measurements L x W x D (cm) 16.965 N/A N/A A (cm) : rea 1.696 N/A N/A Volume (cm) : 46.00% N/A N/A % Reduction in Area: 46.00% N/A N/A % Reduction in Volume: Full Thickness Without Exposed N/A  N/A Classification: Support Structures Medium N/A N/A Exudate Amount: Serosanguineous N/A N/A Exudate Type: red, brown N/A N/A Exudate Color: Medium (34-66%) N/A N/A Granulation Amount: Red, Pink N/A N/A Granulation Quality: Medium (34-66%) N/A N/A Necrotic Amount: Fat Layer (Subcutaneous Tissue): Yes N/A N/A Exposed Structures: Fascia: No Tendon: No Muscle: No Joint: No Bone: No Small (1-33%) N/A N/A Epithelialization: Treatment Notes Electronic Signature(s) Signed: 06/21/2023 4:18:14 PM By: [name] [name] Entered By: [name] [name] on 06/21/2023 11:06:24 Daryel Gerald ([id number]) [id number] Page 5 of 9 -------------------------------------------------------------------------------- Multi-Disciplinary Care Plan Details Patient Name: Date of Service: [name] [name] 06/21/2023 2:00 PM Medical Record Number: [id number] Patient Account Number: [id number] Date of Birth/Sex: Treating RN: 1971/05/16 (52 y.o. [name] [name] Primary Care Sua Spadafora: [name] [name] Other Clinician: Referring Dayden Viverette: Treating Jayme Cham/Extender: [name] [name], [name] [name] in Treatment:  2 Active Inactive Wound/Skin Impairment Nursing Diagnoses: Impaired tissue integrity Knowledge deficit related to smoking impact on wound healing Knowledge deficit related to ulceration/compromised skin integrity Goals: Patient will demonstrate a reduced rate of smoking or cessation of smoking Date Initiated: 06/07/2023 Target Resolution Date: 07/08/2023 Goal Status: Active Patient/caregiver will verbalize understanding of skin care regimen Date Initiated: 06/07/2023 Target Resolution Date: 07/07/2023 Goal Status: Active Ulcer/skin breakdown will have a volume reduction of 30% by week 4 Date Initiated: 06/07/2023 Target Resolution Date: 07/07/2023 Goal Status: Active Ulcer/skin breakdown will have a volume reduction of 50% by week 8 Date Initiated:  06/07/2023 Target Resolution Date: 08/07/2023 Goal Status: Active Ulcer/skin breakdown will have a volume reduction of 80% by week 12 Date Initiated: 06/07/2023 Target Resolution Date: 09/07/2023 Goal Status: Active Interventions: Assess patient/caregiver ability to obtain necessary supplies Assess patient/caregiver ability to perform ulcer/skin care regimen upon admission and as needed Assess ulceration(s) every visit Provide education on smoking Provide education on ulcer and skin care Treatment Activities: Skin care regimen initiated : 06/07/2023 Notes: Electronic Signature(s) Signed: 06/21/2023 4:18:14 PM By: [name] [name] Entered By: [name] [name] on 06/21/2023 11:51:40 Daryel Gerald ([id number]) [id number] Page 6 of 9 -------------------------------------------------------------------------------- Pain Assessment Details Patient Name: Date of Service: [name] [name] 06/21/2023 2:00 PM Medical Record Number: [id number] Patient Account Number: [id number] Date of Birth/Sex: Treating RN: 1971/01/22 (52 y.o. [name] [name] Primary Care Diantha Paxson: [name] [name] Other Clinician: Referring Arturo Sofranko: Treating Sosie Gato/Extender: [name] [name], [name] [name] in Treatment: 2 Active Problems Location of Pain Severity and Description of Pain Patient Has Paino Yes Site Locations Rate the pain. Current Pain Level: 4 Pain Management and Medication Current Pain Management: Electronic Signature(s) Signed: 06/21/2023 4:18:14 PM By: [name] [name] Entered By: [name] [name] on 06/21/2023 10:56:02 -------------------------------------------------------------------------------- Patient/Caregiver Education Details Patient Name: Date of Service: [name] [name] 11/25/2024andnbsp2:00 PM Medical Record Number: [id number] Patient Account Number: [id number] Date of Birth/Gender: Treating RN: 06/01/1971 (52 y.o. [name] [name] Primary Care  Physician: [name] [name] Other Clinician: Referring Physician: Treating Physician/Extender: [name] [name], [name] [name] in Treatment: 2 [place], Kathie Rhodes ([id number]) [id number] Page 7 of 9 Education Assessment Education Provided To: Patient Education Topics Provided Wound/Skin Impairment: Handouts: Caring for Your Ulcer Methods: Explain/Verbal Responses: State content correctly Electronic Signature(s) Signed: 06/21/2023 4:18:14 PM By: [name] [name] Entered By: [name] [name] on 06/21/2023 11:51:49 -------------------------------------------------------------------------------- Wound Assessment Details Patient Name: Date of Service: [name] [name] 06/21/2023 2:00 PM Medical Record Number: [id number] Patient Account Number: [id number] Date of Birth/Sex: Treating RN: [birth year] (52 y.o. [name] [name] Primary Care Tanieka Pownall: [name] [name] Other Clinician: Referring Chasity Outten: Treating Silvia Markuson/Extender: [name] [name], [name] [name] in Treatment: 2 Wound Status Wound Number: 1 Primary Atypical Etiology: Wound Location: Right, Lateral Lower Leg Wound Open Wounding Event: Gradually Appeared Status: Date Acquired: 04/06/2023 Comorbid Chronic Obstructive Pulmonary Disease (COPD), Sleep Apnea, [name] Of Treatment: 2 History: Congestive Heart Failure, Neuropathy Clustered Wound: No Photos Wound Measurements Length: (cm) 4.8 Width: (cm) 4.5 Depth: (cm) 0.1 Area: (cm) 16.965 Volume: (cm) 1.696 % Reduction in Area: 46% % Reduction in Volume: 46% Epithelialization: Small (1-33%) Tunneling: No Undermining: No Wound Description Classification: Full Thickness Without Exposed Support Structures Exudate Amount: Medium Exudate Type: Serosanguineous [name], [name] ([id number]) Exudate Color: red, brown Foul Odor After Cleansing: No Slough/Fibrino Yes [id number] Page 8 of 9 Wound Bed Granulation  Amount: Medium (34-66%) Exposed Structure Granulation Quality: Red, Pink Fascia Exposed: No Necrotic Amount: Medium (34-66%) Fat Layer (Subcutaneous Tissue) Exposed: Yes Necrotic Quality: Adherent Slough Tendon Exposed: No  Muscle Exposed: No Joint Exposed: No Bone Exposed: No Treatment Notes Wound #1 (Lower Leg) Wound Laterality: Right, Lateral Cleanser Soap and Water Discharge Instruction: Gently cleanse wound with antibacterial soap, rinse and pat dry prior to dressing wounds Vashe 5.8 (oz) Discharge Instruction: Use vashe 5.8 (oz) as directed Peri-Wound Care Topical Triamcinolone Acetonide Cream, 0.1%, 15 (g) tube Discharge Instruction: for itching Apply as directed by Jamicheal Heard. Primary Dressing Secondary Dressing Xtrasorb Medium 4x5 (in/in) Discharge Instruction: Apply to wound as directed. Do not cut. Secured With Tubigrip Size D, 3x10 (in/yd) Discharge Instruction: double layer Compression Wrap Compression Stockings Add-Ons Electronic Signature(s) Signed: 06/21/2023 4:18:14 PM By: [name] [name] Entered By: [name] [name] on 06/21/2023 11:05:45 -------------------------------------------------------------------------------- Vitals Details Patient Name: Date of Service: [name] [name] 06/21/2023 2:00 PM Medical Record Number: [id number] Patient Account Number: [id number] Date of Birth/Sex: Treating RN: 06-05-1971 (52 y.o. [name] [name] Primary Care Maiyah Goyne: [name] [name] Other Clinician: Referring Micaiah Litle: Treating Teofil Maniaci/Extender: [name] [name], [name] [name] in Treatment: 2 Vital Signs Time Taken: 13:55 Temperature (F): 97.5 Height (in): 61 Pulse (bpm): 57 Weight (lbs): 330 Respiratory Rate (breaths/min): 18 Body Mass Index (BMI): 62.3 Blood Pressure (mmHg): 126/51 [name], [name] ([id number]) [id number] Page 9 of 9 Reference Range: 80 - 120 mg / dl Electronic Signature(s) Signed: 06/21/2023 4:18:14 PM  By: [name] [name] Entered By: [name] [name] on 06/21/2023 10:55:54

## 2023-06-21 NOTE — Progress Notes (Addendum)
[name], [name] ([id number]) [id number] Page 1 of 8 Visit Report for 06/21/2023 Chief Complaint Document Details Patient Name: Date of Service: [name], [name] 06/21/2023 2:00 PM Medical Record Number: [id number] Patient Account Number: [id number] Date of Birth/Sex: Treating RN: [birth year] (52 y.o. Esmeralda Links Primary Care Provider: Eulogio Bear Other Clinician: Referring Provider: Treating Provider/Extender: Fredrik Rigger, Chioma Weeks in Treatment: 2 Information Obtained from: Patient Chief Complaint Right leg venous leg ulcer Electronic Signature(s) Signed: 06/21/2023 1:53:00 PM By: Allen Derry PA-C Entered By: Allen Derry on 06/21/2023 10:53:00 -------------------------------------------------------------------------------- Debridement Details Patient Name: Date of Service: [name], [name] 06/21/2023 2:00 PM Medical Record Number: [id number] Patient Account Number: [id number] Date of Birth/Sex: Treating RN: 07-Feb-1971 (52 y.o. Esmeralda Links Primary Care Provider: Eulogio Bear Other Clinician: Referring Provider: Treating Provider/Extender: Fredrik Rigger, Chioma Weeks in Treatment: 2 Debridement Performed for Assessment: Wound #1 Right,Lateral Lower Leg Performed By: Physician Allen Derry, PA-C The following information was scribed by: Angelina Pih The information was scribed for: Allen Derry Debridement Type: Debridement Level of Consciousness (Pre-procedure): Awake and Alert Pre-procedure Verification/Time Out Yes - 14:27 Taken: Pain Control: Lidocaine 4% T opical Solution Percent of Wound Bed Debrided: 100% T Area Debrided (cm): otal 16.96 Tissue and other material debrided: Viable, Non-Viable, Slough, Subcutaneous, Slough Level: Skin/Subcutaneous Tissue Debridement Description: Excisional Instrument: Curette Bleeding: Moderate Hemostasis Achieved: Pressure Response to Treatment: Procedure was  tolerated well Level of Consciousness Arlie SolomonsValerieann Pulsifer, Jalissa ([id number]) [id number] Page 2 of 8 Level of Consciousness (Post- Awake and Alert procedure): Post Debridement Measurements of Total Wound Length: (cm) 4.8 Width: (cm) 4.5 Depth: (cm) 0.1 Volume: (cm) 1.696 Character of Wound/Ulcer Post Debridement: Stable Post Procedure Diagnosis Same as Pre-procedure Electronic Signature(s) Signed: 06/21/2023 3:42:23 PM By: Allen Derry PA-C Signed: 06/21/2023 4:18:14 PM By: Angelina Pih Entered By: Angelina Pih on 06/21/2023 11:29:25 -------------------------------------------------------------------------------- HPI Details Patient Name: Date of Service: [name], [name] 06/21/2023 2:00 PM Medical Record Number: [id number] Patient Account Number: [id number] Date of Birth/Sex: Treating RN: 1971-03-24 (52 y.o. Esmeralda Links Primary Care Provider: Eulogio Bear Other Clinician: Referring Provider: Treating Provider/Extender: Fredrik Rigger, Chioma Weeks in Treatment: 2 History of Present Illness HPI Description: Notes from Hospital admission at [facility]: Cellulitis  Venous stasis wound: Presented with fairly significant lower extremity edema and weeping of a wound on her right lower extremity. This has been present for 6 to 8 weeks per the patient. A CT on admission demonstrated cellulitis without abscess or osteomyelitis. The superficial wound culture showed Pseudomonas. There were no risk factors otherwise for Pseudomonas. Overall, this was consistent with venous stasis edema and a mild superimposed cellulitis. She was treated initially with vancomycin for 2 days and then topical mupirocin added. Given the superficial culture showing Pseudomonas, started on IV ciprofloxacin as well as oral cephalexin for MSSA for treatment of her cellulitis. Vancomycin was discontinued. Transitioned from IV to oral ciprofloxacin on the day of  discharge, and continued both ciprofloxacin and cephalexin for a total 7-day course on discharge. Blood cultures negative. Pain was managed with Tylenol and oxycodone as needed, which was also prescribed short term on discharge. Dressing changes performed frequently throughout the day due to significant weeping of her wound, with continuation of dressing changes including Silvadene on discharge. Patient has an appointment with the wound care clinic on November 11 at 1:45 PM Prediabetes (A1c 6.4%). Started on metformin 500mg  XL outpatient. Admission to wound care center [place], [place]: 06-07-2023 upon evaluation today patient appears to be doing somewhat  poorly in regard to her right leg. She tells me that she has been having some issues here with infection. She does tell me that overall the infection has been treated in the hospital initially she was noted to have Pseudomonas and some mild staph. Subsequently she was put on Cipro and Keflex. With that being said she still has quite a bit of drainage as well as erythema she is nearing the completion of her antibiotics they gave oral they were given a total of 7 days of each the Keflex and the Cipro I do not think 7 days is gena be enough I discussed that with her today. With that being said I do believe after reviewing the notes above from [facility] but the patient is on the right track I just think we need to Cipro for a bit longer. Patient does have a history of congestive heart failure, nicotine dependence, COPD, prediabetes, and again chronic venous hypertension. 06-14-2023 upon evaluation today patient appears to be doing well currently in regard to her wound. She has been tolerating the dressing changes without complication. Fortunately I do not see any signs of active infection at this time which is great news. No fevers, chills, nausea, vomiting, or diarrhea. Upon inspection patient's wound bed actually showed signs of good granulation  and epithelization at this point. I actually did perform some debridement clearway necrotic debris today she tolerated that debridement without complication and postdebridement the wound bed is significantly improved. 06-21-2023 upon evaluation today patient's wound is actually showing signs of significant improvement. This is really good news. Fortunately I do not see any signs of infection and overall I believe that we are making really good headway here towards closure which is great news Electronic Signature(s) Signed: 06/21/2023 3:46:55 PM By: Aline Brochure, Roxsana ([id number]) PM By: Allen Derry PA-C [phone].pdf Page 3 of 8 Signed: 06/21/2023 3:46:55 Entered By: Allen Derry on 06/21/2023 12:46:55 -------------------------------------------------------------------------------- Physical Exam Details Patient Name: Date of Service: [name], [name] 06/21/2023 2:00 PM Medical Record Number: [id number] Patient Account Number: [id number] Date of Birth/Sex: Treating RN: 18-Jul-1971 (52 y.o. Esmeralda Links Primary Care Provider: Eulogio Bear Other Clinician: Referring Provider: Treating Provider/Extender: Fredrik Rigger, Chioma Weeks in Treatment: 2 Constitutional Well-nourished and well-hydrated in no acute distress. Respiratory normal breathing without difficulty. Psychiatric this patient is able to make decisions and demonstrates good insight into disease process. Alert and Oriented x 3. pleasant and cooperative. Notes Upon inspection patient's wound bed actually showed signs of good granulation epithelization at this point. Fortunately I do not see any signs of active infection at this time which is excellent in general I do believe that we are making good headway here towards closure which is great news. Electronic Signature(s) Signed: 06/21/2023 3:47:11 PM By: Allen Derry PA-C Entered By: Allen Derry on 06/21/2023  12:47:11 -------------------------------------------------------------------------------- Physician Orders Details Patient Name: Date of Service: [name], [name] 06/21/2023 2:00 PM Medical Record Number: [id number] Patient Account Number: [id number] Date of Birth/Sex: Treating RN: 02/08/1971 (52 y.o. Esmeralda Links Primary Care Provider: Eulogio Bear Other Clinician: Referring Provider: Treating Provider/Extender: Fredrik Rigger, Chioma Weeks in Treatment: 2 The following information was scribed by: Angelina Pih The information was scribed for: Allen Derry Verbal / Phone Orders: No Diagnosis Coding ICD-10 Coding Code Description 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 lower extremity L03.115 Cellulitis of right lower limb Fuston, Teriyah ([id number]) [id number] Page 4 of 8 [phone] Non-pressure chronic ulcer of other  part of right lower leg with fat layer exposed R73.03 Prediabetes J44.89 Other specified chronic obstructive pulmonary disease F17.218 Nicotine dependence, cigarettes, with other nicotine-induced disorders I50.42 Chronic combined systolic (congestive) and diastolic (congestive) heart failure Follow-up Appointments Return Appointment in 1 week. Nurse Visit as needed Edema Control - Orders / Instructions Elevate, Exercise Daily and A void Standing for Long Periods of Time. Elevate legs to the level of the heart and pump ankles as often as possible Elevate leg(s) parallel to the floor when sitting. Wound Treatment Wound #1 - Lower Leg Wound Laterality: Right, Lateral Cleanser: Soap and Water 2 x Per Week/30 Days Discharge Instructions: Gently cleanse wound with antibacterial soap, rinse and pat dry prior to dressing wounds Cleanser: Vashe 5.8 (oz) 2 x Per Week/30 Days Discharge Instructions: Use vashe 5.8 (oz) as directed Topical: Triamcinolone Acetonide Cream, 0.1%, 15 (g)  tube 2 x Per Week/30 Days Discharge Instructions: for itching Apply as directed by provider. Secondary Dressing: Xtrasorb Medium 4x5 (in/in) (DME) (Generic) 2 x Per Week/30 Days Discharge Instructions: Apply to wound as directed. Do not cut. Secured With: Tubigrip Size D, 3x10 (in/yd) 2 x Per Week/30 Days Discharge Instructions: double layer Electronic Signature(s) Signed: 06/21/2023 3:42:23 PM By: Allen Derry PA-C Signed: 06/21/2023 4:18:14 PM By: Angelina Pih Entered By: Angelina Pih on 06/21/2023 12:10:34 -------------------------------------------------------------------------------- Problem List Details Patient Name: Date of Service: [name], [name] 06/21/2023 2:00 PM Medical Record Number: [id number] Patient Account Number: [id number] Date of Birth/Sex: Treating RN: March 30, 1971 (51 y.o. Esmeralda Links Primary Care Provider: Eulogio Bear Other Clinician: Referring Provider: Treating Provider/Extender: Fredrik Rigger, Chioma Weeks in Treatment: 2 Active Problems ICD-10 Encounter Code Description Active Date MDM Diagnosis 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 06/07/2023 No Yes lower extremity L03.115 Cellulitis of right lower limb 06/07/2023 No Yes Renfro, Shawnise ([id number]) [id number] Page 5 of 8 [phone] Non-pressure chronic ulcer of other part of right lower leg with fat layer 06/07/2023 No Yes exposed R73.03 Prediabetes 06/07/2023 No Yes J44.89 Other specified chronic obstructive pulmonary disease 06/07/2023 No Yes F17.218 Nicotine dependence, cigarettes, with other nicotine-induced disorders 06/07/2023 No Yes I50.42 Chronic combined systolic (congestive) and diastolic (congestive) heart failure 06/07/2023 No Yes Inactive Problems Resolved Problems Electronic Signature(s) Signed: 06/21/2023 1:52:54 PM By: Allen Derry PA-C Entered By: Allen Derry on 06/21/2023  10:52:54 -------------------------------------------------------------------------------- Progress Note Details Patient Name: Date of Service: [name], [name] 06/21/2023 2:00 PM Medical Record Number: [id number] Patient Account Number: [id number] Date of Birth/Sex: Treating RN: 12-Oct-1970 (52 y.o. Esmeralda Links Primary Care Provider: Eulogio Bear Other Clinician: Referring Provider: Treating Provider/Extender: Fredrik Rigger, Chioma Weeks in Treatment: 2 Subjective Chief Complaint Information obtained from Patient Right leg venous leg ulcer History of Present Illness (HPI) Notes from Hospital admission at [facility]: Cellulitis  Venous stasis wound: Presented with fairly significant lower extremity edema and weeping of a wound on her right lower extremity. This has been present for 6 to 8 weeks per the patient. A CT on admission demonstrated cellulitis without abscess or osteomyelitis. The superficial wound culture showed Pseudomonas. There were no risk factors otherwise for Pseudomonas. Overall, this was consistent with venous stasis edema and a mild superimposed cellulitis. She was treated initially with vancomycin for 2 days and then topical mupirocin added. Given the superficial culture showing Pseudomonas, started on IV ciprofloxacin as well as oral cephalexin for MSSA for treatment of her cellulitis. Vancomycin was discontinued. Transitioned from IV to oral ciprofloxacin on the day of discharge,  and continued both ciprofloxacin and cephalexin for a total 7-day course on discharge. Blood cultures negative. Pain was managed with Tylenol and oxycodone as needed, which was also prescribed short term on discharge. Dressing changes performed frequently throughout the day due to significant weeping of her wound, with continuation of dressing changes including Silvadene on discharge. Patient has an appointment with the wound care clinic on November 11 at 1:45  PM Prediabetes (A1c 6.4%). Started on metformin 500mg  XL outpatient. Admission to wound care center [place], [place]: 06-07-2023 upon evaluation today patient appears to be doing somewhat poorly in regard to her right leg. She tells me that she has been having some issues BUNTS, Carolyna ([id number]) [id number] Page 6 of 8 here with infection. She does tell me that overall the infection has been treated in the hospital initially she was noted to have Pseudomonas and some mild staph. Subsequently she was put on Cipro and Keflex. With that being said she still has quite a bit of drainage as well as erythema she is nearing the completion of her antibiotics they gave oral they were given a total of 7 days of each the Keflex and the Cipro I do not think 7 days is gena be enough I discussed that with her today. With that being said I do believe after reviewing the notes above from [facility] but the patient is on the right track I just think we need to Cipro for a bit longer. Patient does have a history of congestive heart failure, nicotine dependence, COPD, prediabetes, and again chronic venous hypertension. 06-14-2023 upon evaluation today patient appears to be doing well currently in regard to her wound. She has been tolerating the dressing changes without complication. Fortunately I do not see any signs of active infection at this time which is great news. No fevers, chills, nausea, vomiting, or diarrhea. Upon inspection patient's wound bed actually showed signs of good granulation and epithelization at this point. I actually did perform some debridement clearway necrotic debris today she tolerated that debridement without complication and postdebridement the wound bed is significantly improved. 06-21-2023 upon evaluation today patient's wound is actually showing signs of significant improvement. This is really good news. Fortunately I do not see any signs of infection  and overall I believe that we are making really good headway here towards closure which is great news Objective Constitutional Well-nourished and well-hydrated in no acute distress. Vitals Time Taken: 1:55 PM, Height: 61 in, Weight: 330 lbs, BMI: 62.3, Temperature: 97.5 F, Pulse: 57 bpm, Respiratory Rate: 18 breaths/min, Blood Pressure: 126/51 mmHg. Respiratory normal breathing without difficulty. Psychiatric this patient is able to make decisions and demonstrates good insight into disease process. Alert and Oriented x 3. pleasant and cooperative. General Notes: Upon inspection patient's wound bed actually showed signs of good granulation epithelization at this point. Fortunately I do not see any signs of active infection at this time which is excellent in general I do believe that we are making good headway here towards closure which is great news. Integumentary (Hair, Skin) Wound #1 status is Open. Original cause of wound was Gradually Appeared. The date acquired was: 04/06/2023. The wound has been in treatment 2 weeks. The wound is located on the Right,Lateral Lower Leg. The wound measures 4.8cm length x 4.5cm width x 0.1cm depth; 16.965cm^2 area and 1.696cm^3 volume. There is Fat Layer (Subcutaneous Tissue) exposed. There is no tunneling or undermining noted. There is a medium amount of serosanguineous drainage noted. There is medium (34-66%) red,  pink granulation within the wound bed. There is a medium (34-66%) amount of necrotic tissue within the wound bed including Adherent Slough. Assessment Active Problems ICD-10 Chronic venous hypertension (idiopathic) with ulcer and inflammation of right lower extremity Cellulitis of right lower limb Non-pressure chronic ulcer of other part of right lower leg with fat layer exposed Prediabetes Other specified chronic obstructive pulmonary disease Nicotine dependence, cigarettes, with other nicotine-induced disorders Chronic combined systolic  (congestive) and diastolic (congestive) heart failure Procedures Wound #1 Pre-procedure diagnosis of Wound #1 is an Atypical located on the Right,Lateral Lower Leg . There was a Excisional Skin/Subcutaneous Tissue Debridement with a total area of 16.96 sq cm performed by Allen Derry, PA-C. With the following instrument(s): Curette to remove Viable and Non-Viable tissue/material. Material removed includes Subcutaneous Tissue and Slough and after achieving pain control using Lidocaine 4% T opical Solution. No specimens were taken. A time out was conducted at 14:27, prior to the start of the procedure. A Moderate amount of bleeding was controlled with Pressure. The procedure was tolerated well. Post Debridement Measurements: 4.8cm length x 4.5cm width x 0.1cm depth; 1.696cm^3 volume. Character of Wound/Ulcer Post Debridement is stable. Post procedure Diagnosis Wound #1: Same as Pre-Procedure VEDIA, PERK ([id number]) [id number] Page 7 of 8 Plan Follow-up Appointments: Return Appointment in 1 week. Nurse Visit as needed Edema Control - Orders / Instructions: Elevate, Exercise Daily and Avoid Standing for Long Periods of Time. Elevate legs to the level of the heart and pump ankles as often as possible Elevate leg(s) parallel to the floor when sitting. WOUND #1: - Lower Leg Wound Laterality: Right, Lateral Cleanser: Soap and Water 2 x Per Week/30 Days Discharge Instructions: Gently cleanse wound with antibacterial soap, rinse and pat dry prior to dressing wounds Cleanser: Vashe 5.8 (oz) 2 x Per Week/30 Days Discharge Instructions: Use vashe 5.8 (oz) as directed Topical: Triamcinolone Acetonide Cream, 0.1%, 15 (g) tube 2 x Per Week/30 Days Discharge Instructions: for itching Apply as directed by provider. Secondary Dressing: Xtrasorb Medium 4x5 (in/in) (DME) (Generic) 2 x Per Week/30 Days Discharge Instructions: Apply to wound as directed. Do not cut. Secured With:  Tubigrip Size D, 3x10 (in/yd) 2 x Per Week/30 Days Discharge Instructions: double layer 1. I would recommend based on what we see that the patient should go ahead and continue with the wound care measures as before. We have been utilizing at this point the triamcinolone to the leg in general followed by XtraSorb to the wound bed and Tubigrip size D. 2. Muscle can recommend patient should continue to elevate her legs much as possible to help with edema control. 3. Muscle can do suggest that the patient should continue to monitor for any signs of infection or worsening and if anything changes she should contact the office and let me know. Am very pleased with how she is proceeding at this point. We will see patient back for reevaluation in 1 week here in the clinic. If anything worsens or changes patient will contact our office for additional recommendations. Electronic Signature(s) Signed: 06/21/2023 3:47:53 PM By: Allen Derry PA-C Entered By: Allen Derry on 06/21/2023 12:47:53 -------------------------------------------------------------------------------- SuperBill Details Patient Name: Date of Service: [name], [name] 06/21/2023 Medical Record Number: [id number] Patient Account Number: [id number] Date of Birth/Sex: Treating RN: 10/28/1970 (52 y.o. Esmeralda Links Primary Care Provider: Eulogio Bear Other Clinician: Referring Provider: Treating Provider/Extender: Fredrik Rigger, Chioma Weeks in Treatment: 2 Diagnosis Coding ICD-10 Codes Code Description [phone] Chronic venous hypertension (idiopathic) with ulcer and  inflammation of right lower extremity L03.115 Cellulitis of right lower limb L97.812 Non-pressure chronic ulcer of other part of right lower leg with fat layer exposed R73.03 Prediabetes J44.89 Other specified chronic obstructive pulmonary disease F17.218 Nicotine dependence, cigarettes, with other nicotine-induced disorders I50.42 Chronic combined  systolic (congestive) and diastolic (congestive) heart failure Facility Procedures : CINZIA, BOYERS Code: [id number] Ariannah ([id number]) ICD-1 Description: 11042 - DEB SUBQ TISSUE 20 SQ CM/< ICD-10 Diagnosis Description [id number] Diagnosis Description L97.812 Non-pressure chronic ulcer of other part of right lower leg with fat layer exposed Modifier: [id number] Pag Quantity: 1 e 8 of 8 Physician Procedures : CPT4 Code Description Modifier 11042 11042 - WC PHYS SUBQ TISS 20 SQ CM ICD-10 Diagnosis Description L97.812 Non-pressure chronic ulcer of other part of right lower leg with fat layer exposed Quantity: 1 Electronic Signature(s) Signed: 06/21/2023 3:48:04 PM By: Allen Derry PA-C Entered By: Allen Derry on 06/21/2023 12:48:04

## 2023-06-28 ENCOUNTER — Encounter: Payer: 59 | Attending: Physician Assistant | Admitting: Physician Assistant

## 2023-06-28 DIAGNOSIS — F17210 Nicotine dependence, cigarettes, uncomplicated: Secondary | ICD-10-CM | POA: Diagnosis not present

## 2023-06-28 DIAGNOSIS — L03115 Cellulitis of right lower limb: Secondary | ICD-10-CM | POA: Diagnosis not present

## 2023-06-28 DIAGNOSIS — I87331 Chronic venous hypertension (idiopathic) with ulcer and inflammation of right lower extremity: Secondary | ICD-10-CM | POA: Insufficient documentation

## 2023-06-28 DIAGNOSIS — R7303 Prediabetes: Secondary | ICD-10-CM | POA: Insufficient documentation

## 2023-06-28 DIAGNOSIS — L97812 Non-pressure chronic ulcer of other part of right lower leg with fat layer exposed: Secondary | ICD-10-CM | POA: Insufficient documentation

## 2023-06-28 DIAGNOSIS — J449 Chronic obstructive pulmonary disease, unspecified: Secondary | ICD-10-CM | POA: Diagnosis not present

## 2023-06-28 DIAGNOSIS — I5042 Chronic combined systolic (congestive) and diastolic (congestive) heart failure: Secondary | ICD-10-CM | POA: Insufficient documentation

## 2023-06-28 DIAGNOSIS — I110 Hypertensive heart disease with heart failure: Secondary | ICD-10-CM | POA: Insufficient documentation

## 2023-06-28 NOTE — Progress Notes (Addendum)
SAMUS, WERTH ([id number]) [id number] Page 1 of 7 Visit Report for 06/28/2023 Chief Complaint Document Details Patient Name: Date of Service: [name], Jenisis 06/28/2023 2:30 PM Medical Record Number: [id number] Patient Account Number: [id number] Date of Birth/Sex: Treating RN: May 20, 1971 (52 y.o. Esmeralda Links Primary Care Provider: Eulogio Bear Other Clinician: Referring Provider: Treating Provider/Extender: Fredrik Rigger, Chioma Weeks in Treatment: 3 Information Obtained from: Patient Chief Complaint Right leg venous leg ulcer Electronic Signature(s) Signed: 06/28/2023 2:30:59 PM By: Allen Derry PA-C Entered By: Allen Derry on 06/28/2023 14:30:59 -------------------------------------------------------------------------------- HPI Details Patient Name: Date of Service: [name], [name] 06/28/2023 2:30 PM Medical Record Number: [id number] Patient Account Number: [id number] Date of Birth/Sex: Treating RN: October 30, 1970 (52 y.o. Esmeralda Links Primary Care Provider: Eulogio Bear Other Clinician: Referring Provider: Treating Provider/Extender: Fredrik Rigger, Chioma Weeks in Treatment: 3 History of Present Illness HPI Description: Notes from Hospital admission at [facility]: Cellulitis  Venous stasis wound: Presented with fairly significant lower extremity edema and weeping of a wound on her right lower extremity. This has been present for 6 to 8 weeks per the patient. A CT on admission demonstrated cellulitis without abscess or osteomyelitis. The superficial wound culture showed Pseudomonas. There were no risk factors otherwise for Pseudomonas. Overall, this was consistent with venous stasis edema and a mild superimposed cellulitis. She was treated initially with vancomycin for 2 days and then topical mupirocin added. Given the superficial culture showing Pseudomonas, started on IV ciprofloxacin as well as oral  cephalexin for MSSA for treatment of her cellulitis. Vancomycin was discontinued. Transitioned from IV to oral ciprofloxacin on the day of discharge, and continued both ciprofloxacin and cephalexin for a total 7-day course on discharge. Blood cultures negative. Pain was managed with Tylenol and oxycodone as needed, which was also prescribed short term on discharge. Dressing changes performed frequently throughout the day due to significant weeping of her wound, with continuation of dressing changes including Silvadene on discharge. Patient has an appointment with the wound care clinic on November 11 at 1:45 PM Prediabetes (A1c 6.4%). Started on metformin 500mg  XL outpatient. Admission to wound care center [place], [place]: 06-07-2023 upon evaluation today patient appears to be doing somewhat poorly in regard to her right leg. She tells me that she has been having some issues WHEELESS, Quinnetta ([id number]) [id number] Page 2 of 7 here with infection. She does tell me that overall the infection has been treated in the hospital initially she was noted to have Pseudomonas and some mild staph. Subsequently she was put on Cipro and Keflex. With that being said she still has quite a bit of drainage as well as erythema she is nearing the completion of her antibiotics they gave oral they were given a total of 7 days of each the Keflex and the Cipro I do not think 7 days is gena be enough I discussed that with her today. With that being said I do believe after reviewing the notes above from [facility] but the patient is on the right track I just think we need to Cipro for a bit longer. Patient does have a history of congestive heart failure, nicotine dependence, COPD, prediabetes, and again chronic venous hypertension. 06-14-2023 upon evaluation today patient appears to be doing well currently in regard to her wound. She has been tolerating the dressing changes without complication.  Fortunately I do not see any signs of active infection at this time which is great news. No fevers, chills, nausea, vomiting, or diarrhea. Upon inspection  patient's wound bed actually showed signs of good granulation and epithelization at this point. I actually did perform some debridement clearway necrotic debris today she tolerated that debridement without complication and postdebridement the wound bed is significantly improved. 06-21-2023 upon evaluation today patient's wound is actually showing signs of significant improvement. This is really good news. Fortunately I do not see any signs of infection and overall I believe that we are making really good headway here towards closure which is great news 06-28-2023 upon evaluation today patient appears to be doing well currently in regard to her wound which is actually showing some signs of improvement this has come along way. With that being said it does have some new skin around the edge she did not have any of the XtraSorb to put on we have tried to get her some of the dressing supplies we had here but unfortunately they did not get a message in time. Electronic Signature(s) Signed: 06/28/2023 2:45:03 PM By: Allen Derry PA-C Entered By: Allen Derry on 06/28/2023 14:45:03 -------------------------------------------------------------------------------- Physical Exam Details Patient Name: Date of Service: [name], [name] 06/28/2023 2:30 PM Medical Record Number: [id number] Patient Account Number: [id number] Date of Birth/Sex: Treating RN: 04/17/1971 (52 y.o. Esmeralda Links Primary Care Provider: Eulogio Bear Other Clinician: Referring Provider: Treating Provider/Extender: Fredrik Rigger, Chioma Weeks in Treatment: 3 Constitutional Obese and well-hydrated in no acute distress. Respiratory normal breathing without difficulty. Psychiatric this patient is able to make decisions and demonstrates good insight into disease process.  Alert and Oriented x 3. pleasant and cooperative. Notes Upon inspection patient's wound bed showed signs of doing excellent no signs of infection and in general I am actually very pleased with where things stand today. I do not see any signs of worsening overall which is good news. Electronic Signature(s) Signed: 06/28/2023 2:45:20 PM By: Allen Derry PA-C Entered By: Allen Derry on 06/28/2023 14:45:20 Tutton, Kathie Rhodes ([id number]) [id number] Page 3 of 7 -------------------------------------------------------------------------------- Physician Orders Details Patient Name: Date of Service: [name], Torian 06/28/2023 2:30 PM Medical Record Number: [id number] Patient Account Number: [id number] Date of Birth/Sex: Treating RN: [birth year] (52 y.o. Esmeralda Links Primary Care Provider: Eulogio Bear Other Clinician: Referring Provider: Treating Provider/Extender: Fredrik Rigger, Chioma Weeks in Treatment: 3 The following information was scribed by: Angelina Pih The information was scribed for: Allen Derry Verbal / Phone Orders: No Diagnosis Coding ICD-10 Coding Code Description 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 lower extremity L03.115 Cellulitis of right lower limb L97.812 Non-pressure chronic ulcer of other part of right lower leg with fat layer exposed R73.03 Prediabetes J44.89 Other specified chronic obstructive pulmonary disease F17.218 Nicotine dependence, cigarettes, with other nicotine-induced disorders I50.42 Chronic combined systolic (congestive) and diastolic (congestive) heart failure Follow-up Appointments Return Appointment in 1 week. Nurse Visit as needed Edema Control - Orders / Instructions Elevate, Exercise Daily and A void Standing for Long Periods of Time. Elevate legs to the level of the heart and pump ankles as often as possible Elevate leg(s) parallel to the floor when  sitting. Wound Treatment Wound #1 - Lower Leg Wound Laterality: Right, Lateral Cleanser: Soap and Water 2 x Per Week/30 Days Discharge Instructions: Gently cleanse wound with antibacterial soap, rinse and pat dry prior to dressing wounds Cleanser: Vashe 5.8 (oz) 2 x Per Week/30 Days Discharge Instructions: Use vashe 5.8 (oz) as directed Topical: Triamcinolone Acetonide Cream, 0.1%, 15 (g) tube 2 x Per Week/30 Days Discharge Instructions: for itching Apply as directed by  provider. Secondary Dressing: Xtrasorb Medium 4x5 (in/in) (Generic) 2 x Per Week/30 Days Discharge Instructions: Apply to wound as directed. Do not cut. Secured With: Tubigrip Size D, 3x10 (in/yd) 2 x Per Week/30 Days Discharge Instructions: double layer Electronic Signature(s) Signed: 06/28/2023 3:27:26 PM By: Angelina Pih Signed: 06/28/2023 3:39:27 PM By: Allen Derry PA-C Entered By: Angelina Pih on 06/28/2023 14:53:27 Cupit, Kathie Rhodes ([id number]) [id number] Page 4 of 7 -------------------------------------------------------------------------------- Problem List Details Patient Name: Date of Service: [name], [name] 06/28/2023 2:30 PM Medical Record Number: [id number] Patient Account Number: [id number] Date of Birth/Sex: Treating RN: 1971-03-09 (52 y.o. Esmeralda Links Primary Care Provider: Eulogio Bear Other Clinician: Referring Provider: Treating Provider/Extender: Fredrik Rigger, Chioma Weeks in Treatment: 3 Active Problems ICD-10 Encounter Code Description Active Date MDM Diagnosis 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 06/07/2023 No Yes lower extremity L03.115 Cellulitis of right lower limb 06/07/2023 No Yes L97.812 Non-pressure chronic ulcer of other part of right lower leg with fat layer 06/07/2023 No Yes exposed R73.03 Prediabetes 06/07/2023 No Yes J44.89 Other specified chronic obstructive pulmonary disease  06/07/2023 No Yes F17.218 Nicotine dependence, cigarettes, with other nicotine-induced disorders 06/07/2023 No Yes I50.42 Chronic combined systolic (congestive) and diastolic (congestive) heart failure 06/07/2023 No Yes Inactive Problems Resolved Problems Electronic Signature(s) Signed: 06/28/2023 2:30:54 PM By: Allen Derry PA-C Entered By: Allen Derry on 06/28/2023 14:30:54 Switzer, Kathie Rhodes ([id number]) [id number] Page 5 of 7 -------------------------------------------------------------------------------- Progress Note Details Patient Name: Date of Service: [name], [name] 06/28/2023 2:30 PM Medical Record Number: [id number] Patient Account Number: [id number] Date of Birth/Sex: Treating RN: 1971/01/21 (53 y.o. Esmeralda Links Primary Care Provider: Eulogio Bear Other Clinician: Referring Provider: Treating Provider/Extender: Fredrik Rigger, Chioma Weeks in Treatment: 3 Subjective Chief Complaint Information obtained from Patient Right leg venous leg ulcer History of Present Illness (HPI) Notes from Hospital admission at [facility]: Cellulitis  Venous stasis wound: Presented with fairly significant lower extremity edema and weeping of a wound on her right lower extremity. This has been present for 6 to 8 weeks per the patient. A CT on admission demonstrated cellulitis without abscess or osteomyelitis. The superficial wound culture showed Pseudomonas. There were no risk factors otherwise for Pseudomonas. Overall, this was consistent with venous stasis edema and a mild superimposed cellulitis. She was treated initially with vancomycin for 2 days and then topical mupirocin added. Given the superficial culture showing Pseudomonas, started on IV ciprofloxacin as well as oral cephalexin for MSSA for treatment of her cellulitis. Vancomycin was discontinued. Transitioned from IV to oral ciprofloxacin on the day of discharge, and continued both  ciprofloxacin and cephalexin for a total 7-day course on discharge. Blood cultures negative. Pain was managed with Tylenol and oxycodone as needed, which was also prescribed short term on discharge. Dressing changes performed frequently throughout the day due to significant weeping of her wound, with continuation of dressing changes including Silvadene on discharge. Patient has an appointment with the wound care clinic on November 11 at 1:45 PM Prediabetes (A1c 6.4%). Started on metformin 500mg  XL outpatient. Admission to wound care center [place], [place]: 06-07-2023 upon evaluation today patient appears to be doing somewhat poorly in regard to her right leg. She tells me that she has been having some issues here with infection. She does tell me that overall the infection has been treated in the hospital initially she was noted to have Pseudomonas and some mild staph. Subsequently she was put on Cipro and Keflex. With that being said she  still has quite a bit of drainage as well as erythema she is nearing the completion of her antibiotics they gave oral they were given a total of 7 days of each the Keflex and the Cipro I do not think 7 days is gena be enough I discussed that with her today. With that being said I do believe after reviewing the notes above from [facility] but the patient is on the right track I just think we need to Cipro for a bit longer. Patient does have a history of congestive heart failure, nicotine dependence, COPD, prediabetes, and again chronic venous hypertension. 06-14-2023 upon evaluation today patient appears to be doing well currently in regard to her wound. She has been tolerating the dressing changes without complication. Fortunately I do not see any signs of active infection at this time which is great news. No fevers, chills, nausea, vomiting, or diarrhea. Upon inspection patient's wound bed actually showed signs of good granulation and epithelization at this  point. I actually did perform some debridement clearway necrotic debris today she tolerated that debridement without complication and postdebridement the wound bed is significantly improved. 06-21-2023 upon evaluation today patient's wound is actually showing signs of significant improvement. This is really good news. Fortunately I do not see any signs of infection and overall I believe that we are making really good headway here towards closure which is great news 06-28-2023 upon evaluation today patient appears to be doing well currently in regard to her wound which is actually showing some signs of improvement this has come along way. With that being said it does have some new skin around the edge she did not have any of the XtraSorb to put on we have tried to get her some of the dressing supplies we had here but unfortunately they did not get a message in time. Objective Constitutional Obese and well-hydrated in no acute distress. Vitals Time Taken: 2:30 PM, Height: 61 in, Weight: 330 lbs, BMI: 62.3, Temperature: 97.8 F, Pulse: 63 bpm, Respiratory Rate: 18 breaths/min, Blood Pressure: 104/73 mmHg. Respiratory Nanna, Luke ([id number]) [id number] Page 6 of 7 normal breathing without difficulty. Psychiatric this patient is able to make decisions and demonstrates good insight into disease process. Alert and Oriented x 3. pleasant and cooperative. General Notes: Upon inspection patient's wound bed showed signs of doing excellent no signs of infection and in general I am actually very pleased with where things stand today. I do not see any signs of worsening overall which is good news. Integumentary (Hair, Skin) Wound #1 status is Open. Original cause of wound was Gradually Appeared. The date acquired was: 04/06/2023. The wound has been in treatment 3 weeks. The wound is located on the Right,Lateral Lower Leg. The wound measures 4.8cm length x 4cm width x 0.1cm depth;  15.08cm^2 area and 1.508cm^3 volume. There is Fat Layer (Subcutaneous Tissue) exposed. There is no tunneling or undermining noted. There is a medium amount of serosanguineous drainage noted. There is medium (34-66%) red, pink granulation within the wound bed. There is a medium (34-66%) amount of necrotic tissue within the wound bed including Adherent Slough. Assessment Active Problems ICD-10 Chronic venous hypertension (idiopathic) with ulcer and inflammation of right lower extremity Cellulitis of right lower limb Non-pressure chronic ulcer of other part of right lower leg with fat layer exposed Prediabetes Other specified chronic obstructive pulmonary disease Nicotine dependence, cigarettes, with other nicotine-induced disorders Chronic combined systolic (congestive) and diastolic (congestive) heart failure Plan 1. I would recommend that we  have the patient going continue with the XtraSorb I think this is still can be the best way to go. 2. I am also can recommend that the patient should continue to monitor for any signs of infection obviously if anything changes she knows to contact the office and let me know. Right now I see nothing that looks to be infected. 3. I am also going to suggest that she should continue to elevate her legs much as possible also using Tubigrip to cover to keep the swelling under control. We will see patient back for reevaluation in 1 week here in the clinic. If anything worsens or changes patient will contact our office for additional recommendations. Electronic Signature(s) Signed: 06/28/2023 2:46:02 PM By: Allen Derry PA-C Entered By: Allen Derry on 06/28/2023 14:46:02 -------------------------------------------------------------------------------- SuperBill Details Patient Name: Date of Service: [name], [name] 06/28/2023 Medical Record Number: [id number] Patient Account Number: [id number] Date of Birth/Sex: Treating RN: May 06, 1971 (52 y.o. Esmeralda Links Primary Care Provider: Eulogio Bear Other Clinician: Referring Provider: Treating Provider/Extender: Fredrik Rigger, Chioma Weeks in Treatment: 3 Diagnosis Coding ICD-10 Codes Code Description [phone] Chronic venous hypertension (idiopathic) with ulcer and inflammation of right lower extremity Mcelroy, Adaira ([id number]) [id number] Page 7 of 7 L03.115 Cellulitis of right lower limb L97.812 Non-pressure chronic ulcer of other part of right lower leg with fat layer exposed R73.03 Prediabetes J44.89 Other specified chronic obstructive pulmonary disease F17.218 Nicotine dependence, cigarettes, with other nicotine-induced disorders I50.42 Chronic combined systolic (congestive) and diastolic (congestive) heart failure Facility Procedures : CPT4 Code: [id number] Description: 99213 - WOUND CARE VISIT-LEV 3 EST PT Modifier: Quantity: 1 Physician Procedures : CPT4 Code Description Modifier [id number] 99213 - WC PHYS LEVEL 3 - EST PT ICD-10 Diagnosis Description 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 lower extremity L03.115 Cellulitis of right lower limb L97.812  Non-pressure chronic ulcer of other part of right lower leg with fat layer exposed R73.03 Prediabetes Quantity: 1 Electronic Signature(s) Signed: 06/28/2023 3:27:26 PM By: Angelina Pih Signed: 06/28/2023 3:39:27 PM By: Allen Derry PA-C Previous Signature: 06/28/2023 2:46:14 PM Version By: Allen Derry PA-C Entered By: Angelina Pih on 06/28/2023 14:54:08

## 2023-06-28 NOTE — Progress Notes (Signed)
[name] [name] ([id number]) [id number] Page 1 of 9 Visit Report for 06/28/2023 Arrival Information Details Patient Name: Date of Service: [name] [name] 06/28/2023 2:30 PM Medical Record Number: [id number] Patient Account Number: [id number] Date of Birth/Sex: Treating RN: [birth year] (52 y.o. Esmeralda Links Primary Care Caitlyne Ingham: Eulogio Bear Other Clinician: Referring Bentleigh Stankus: Treating Kainat Pizana/Extender: Fredrik Rigger, Chioma Weeks in Treatment: 3 Visit Information History Since Last Visit Added or deleted any medications: No Patient Arrived: Ambulatory Any new allergies or adverse reactions: No Arrival Time: 14:27 Had a fall or experienced change in No Accompanied By: family activities of daily living that may affect Transfer Assistance: None risk of falls: Patient Identification Verified: Yes Hospitalized since last visit: No Secondary Verification Process Completed: Yes Has Dressing in Place as Prescribed: Yes Patient Requires Transmission-Based Precautions: No Has Compression in Place as Prescribed: Yes Patient Has Alerts: Yes Pain Present Now: Yes Patient Alerts: Prediabetic Electronic Signature(s) Signed: 06/28/2023 3:27:26 PM By: Angelina Pih Entered By: Angelina Pih on 06/28/2023 14:30:42 -------------------------------------------------------------------------------- Clinic Level of Care Assessment Details Patient Name: Date of Service: [name] [name] 06/28/2023 2:30 PM Medical Record Number: [id number] Patient Account Number: [id number] Date of Birth/Sex: Treating RN: Mar 30, 1971 (52 y.o. Esmeralda Links Primary Care Yogi Arther: Eulogio Bear Other Clinician: Referring Brooklyne Radke: Treating Shahida Schnackenberg/Extender: Fredrik Rigger, Chioma Weeks in Treatment: 3 Clinic Level of Care Assessment Items TOOL 4 Quantity Score []  - 0 Use when only an EandM is performed on FOLLOW-UP visit ASSESSMENTS - Nursing  Assessment / Reassessment X- 1 10 Reassessment of Co-morbidities (includes updates in patient status) X- 1 5 Reassessment of Adherence to Treatment Plan ASSESSMENTS - Wound and Skin A ssessment / Reassessment X - Simple Wound Assessment / Reassessment - one wound 1 5 [name], [name] ([id number]) [id number] Page 2 of 9 []  - 0 Complex Wound Assessment / Reassessment - multiple wounds []  - 0 Dermatologic / Skin Assessment (not related to wound area) ASSESSMENTS - Focused Assessment []  - 0 Circumferential Edema Measurements - multi extremities []  - 0 Nutritional Assessment / Counseling / Intervention X- 1 5 Lower Extremity Assessment (monofilament, tuning fork, pulses) []  - 0 Peripheral Arterial Disease Assessment (using hand held doppler) ASSESSMENTS - Ostomy and/or Continence Assessment and Care []  - 0 Incontinence Assessment and Management []  - 0 Ostomy Care Assessment and Management (repouching, etc.) PROCESS - Coordination of Care X - Simple Patient / Family Education for ongoing care 1 15 []  - 0 Complex (extensive) Patient / Family Education for ongoing care X- 1 10 Staff obtains Chiropractor, Records, T Results / Process Orders est []  - 0 Staff telephones HHA, Nursing Homes / Clarify orders / etc []  - 0 Routine Transfer to another Facility (non-emergent condition) []  - 0 Routine Hospital Admission (non-emergent condition) []  - 0 New Admissions / Manufacturing engineer / Ordering NPWT Apligraf, etc. , []  - 0 Emergency Hospital Admission (emergent condition) X- 1 10 Simple Discharge Coordination []  - 0 Complex (extensive) Discharge Coordination PROCESS - Special Needs []  - 0 Pediatric / Minor Patient Management []  - 0 Isolation Patient Management []  - 0 Hearing / Language / Visual special needs []  - 0 Assessment of Community assistance (transportation, D/C planning, etc.) []  - 0 Additional assistance / Altered mentation []  -  0 Support Surface(s) Assessment (bed, cushion, seat, etc.) INTERVENTIONS - Wound Cleansing / Measurement X - Simple Wound Cleansing - one wound 1 5 []  - 0 Complex Wound Cleansing - multiple wounds X- 1 5 Wound Imaging (photographs - any number of  wounds) []  - 0 Wound Tracing (instead of photographs) X- 1 5 Simple Wound Measurement - one wound []  - 0 Complex Wound Measurement - multiple wounds INTERVENTIONS - Wound Dressings X - Small Wound Dressing one or multiple wounds 1 10 []  - 0 Medium Wound Dressing one or multiple wounds []  - 0 Large Wound Dressing one or multiple wounds X- 1 5 Application of Medications - topical []  - 0 Application of Medications - injection INTERVENTIONS - Miscellaneous []  - 0 External ear exam []  - 0 Specimen Collection (cultures, biopsies, blood, body fluids, etc.) []  - 0 Specimen(s) / Culture(s) sent or taken to Lab for analysis [name], [name] Rhodes ([id number]) [id number] Page 3 of 9 []  - 0 Patient Transfer (multiple staff / Nurse, adult / Similar devices) []  - 0 Simple Staple / Suture removal (25 or less) []  - 0 Complex Staple / Suture removal (26 or more) []  - 0 Hypo / Hyperglycemic Management (close monitor of Blood Glucose) []  - 0 Ankle / Brachial Index (ABI) - do not check if billed separately X- 1 5 Vital Signs Has the patient been seen at the hospital within the last three years: Yes Total Score: 95 Level Of Care: New/Established - Level 3 Electronic Signature(s) Signed: 06/28/2023 3:27:26 PM By: Angelina Pih Entered By: Angelina Pih on 06/28/2023 14:54:00 -------------------------------------------------------------------------------- Encounter Discharge Information Details Patient Name: Date of Service: [name] [name] 06/28/2023 2:30 PM Medical Record Number: [id number] Patient Account Number: [id number] Date of Birth/Sex: Treating RN: [birth year] (52 y.o. Esmeralda Links Primary Care Taya Ashbaugh:  Eulogio Bear Other Clinician: Referring Ariel Dimitri: Treating Ramzi Brathwaite/Extender: Fredrik Rigger, Chioma Weeks in Treatment: 3 Encounter Discharge Information Items Discharge Condition: Stable Ambulatory Status: Ambulatory Discharge Destination: Home Transportation: Private Auto Accompanied By: family Schedule Follow-up Appointment: Yes Clinical Summary of Care: Electronic Signature(s) Signed: 06/28/2023 3:27:26 PM By: Angelina Pih Entered By: Angelina Pih on 06/28/2023 14:54:55 -------------------------------------------------------------------------------- Lower Extremity Assessment Details Patient Name: Date of Service: [name] [name] 06/28/2023 2:30 PM Medical Record Number: [id number] Patient Account Number: [id number] Date of Birth/Sex: Treating RN: 10-Feb-1971 (53 y.o. Esmeralda Links Primary Care Marvell Stavola: Eulogio Bear Other Clinician: DEEKSHA, MCLELAND ([id number]) [id number] Page 4 of 9 Referring Gualberto Wahlen: Treating Fain Francis/Extender: Fredrik Rigger, Chioma Weeks in Treatment: 3 Edema Assessment Assessed: [Left: No] [Right: No] Edema: [Left: Ye] [Right: s] Calf Left: Right: Point of Measurement: 33 cm From Medial Instep 47.2 cm Ankle Left: Right: Point of Measurement: 12 cm From Medial Instep 25.5 cm Vascular Assessment Pulses: Dorsalis Pedis Palpable: [Right:Yes] Extremity colors, hair growth, and conditions: Extremity Color: [Right:Red] Hair Growth on Extremity: [Right:Yes] Temperature of Extremity: [Right:Warm < 3 seconds] Toe Nail Assessment Left: Right: Thick: No Discolored: No Deformed: No Improper Length and Hygiene: No Electronic Signature(s) Signed: 06/28/2023 3:27:26 PM By: Angelina Pih Entered By: Angelina Pih on 06/28/2023 14:35:12 -------------------------------------------------------------------------------- Multi Wound Chart Details Patient Name: Date of Service: [name] [name], Aemilia  06/28/2023 2:30 PM Medical Record Number: [id number] Patient Account Number: [id number] Date of Birth/Sex: Treating RN: 1971-03-17 (52 y.o. Esmeralda Links Primary Care Jakel Alphin: Eulogio Bear Other Clinician: Referring Emaly Boschert: Treating Durrell Barajas/Extender: Fredrik Rigger, Chioma Weeks in Treatment: 3 Vital Signs Height(in): 61 Pulse(bpm): 63 Weight(lbs): 330 Blood Pressure(mmHg): 104/73 Body Mass Index(BMI): 62.3 Temperature(F): 97.8 Respiratory Rate(breaths/min): 18 [1:Photos:] [N/A:N/A] Right, Lateral Lower Leg N/A N/A Wound Location: Gradually Appeared N/A N/A Wounding Event: Atypical N/A N/A Primary Etiology: Chronic Obstructive Pulmonary N/A N/A Comorbid History: Disease (COPD), Sleep Apnea, Congestive Heart Failure, Neuropathy 04/06/2023 N/A N/A Date Acquired:  3 N/A N/A Weeks of Treatment: Open N/A N/A Wound Status: No N/A N/A Wound Recurrence: 4.8x4x0.1 N/A N/A Measurements L x W x D (cm) 15.08 N/A N/A A (cm) : rea 1.508 N/A N/A Volume (cm) : 52.00% N/A N/A % Reduction in Area: 52.00% N/A N/A % Reduction in Volume: Full Thickness Without Exposed N/A N/A Classification: Support Structures Medium N/A N/A Exudate Amount: Serosanguineous N/A N/A Exudate Type: red, brown N/A N/A Exudate Color: Medium (34-66%) N/A N/A Granulation Amount: Red, Pink N/A N/A Granulation Quality: Medium (34-66%) N/A N/A Necrotic Amount: Fat Layer (Subcutaneous Tissue): Yes N/A N/A Exposed Structures: Fascia: No Tendon: No Muscle: No Joint: No Bone: No Small (1-33%) N/A N/A Epithelialization: Treatment Notes Electronic Signature(s) Signed: 06/28/2023 3:27:26 PM By: Angelina Pih Entered By: Angelina Pih on 06/28/2023 14:42:04 -------------------------------------------------------------------------------- Multi-Disciplinary Care Plan Details Patient Name: Date of Service: [name] [name] 06/28/2023 2:30 PM Medical Record Number:  [id number] Patient Account Number: [id number] Date of Birth/Sex: Treating RN: Jun 09, 1971 (52 y.o. Esmeralda Links Primary Care Angellica Maddison: Eulogio Bear Other Clinician: Referring Vartan Kerins: Treating Kirubel Aja/Extender: Fredrik Rigger, Chioma Weeks in Treatment: 3 Active Inactive Wound/Skin Impairment Nursing Diagnoses: Impaired tissue integrity Knowledge deficit related to smoking impact on wound healing Knowledge deficit related to ulceration/compromised skin integrity Harold, Baily ([id number]) [id number] Page 6 of 9 Goals: Patient will demonstrate a reduced rate of smoking or cessation of smoking Date Initiated: 06/07/2023 Target Resolution Date: 07/08/2023 Goal Status: Active Patient/caregiver will verbalize understanding of skin care regimen Date Initiated: 06/07/2023 Target Resolution Date: 07/07/2023 Goal Status: Active Ulcer/skin breakdown will have a volume reduction of 30% by week 4 Date Initiated: 06/07/2023 Target Resolution Date: 07/07/2023 Goal Status: Active Ulcer/skin breakdown will have a volume reduction of 50% by week 8 Date Initiated: 06/07/2023 Target Resolution Date: 08/07/2023 Goal Status: Active Ulcer/skin breakdown will have a volume reduction of 80% by week 12 Date Initiated: 06/07/2023 Target Resolution Date: 09/07/2023 Goal Status: Active Interventions: Assess patient/caregiver ability to obtain necessary supplies Assess patient/caregiver ability to perform ulcer/skin care regimen upon admission and as needed Assess ulceration(s) every visit Provide education on smoking Provide education on ulcer and skin care Treatment Activities: Skin care regimen initiated : 06/07/2023 Notes: Electronic Signature(s) Signed: 06/28/2023 3:27:26 PM By: Angelina Pih Entered By: Angelina Pih on 06/28/2023 14:54:17 -------------------------------------------------------------------------------- Pain Assessment  Details Patient Name: Date of Service: [name] [name], Faria 06/28/2023 2:30 PM Medical Record Number: [id number] Patient Account Number: [id number] Date of Birth/Sex: Treating RN: 1970/08/31 (52 y.o. Esmeralda Links Primary Care Nikola Marone: Eulogio Bear Other Clinician: Referring Keenya Matera: Treating Demere Dotzler/Extender: Fredrik Rigger, Chioma Weeks in Treatment: 3 Active Problems Location of Pain Severity and Description of Pain Patient Has Paino Yes Site Locations Rate the pain. [name], [name] ([id number]) [id number] Page 7 of 9 Rate the pain. Current Pain Level: 6 Pain Management and Medication Current Pain Management: Electronic Signature(s) Signed: 06/28/2023 3:27:26 PM By: Angelina Pih Entered By: Angelina Pih on 06/28/2023 14:34:41 -------------------------------------------------------------------------------- Patient/Caregiver Education Details Patient Name: Date of Service: [name] [name] 12/2/2024andnbsp2:30 PM Medical Record Number: [id number] Patient Account Number: [id number] Date of Birth/Gender: Treating RN: 1970-09-11 (52 y.o. Esmeralda Links Primary Care Physician: Eulogio Bear Other Clinician: Referring Physician: Treating Physician/Extender: [name] [name] Weeks in Treatment: 3 Education Assessment Education Provided To: Patient Education Topics Provided Wound/Skin Impairment: Handouts: Caring for Your Ulcer Methods: Explain/Verbal Responses: State content correctly Electronic Signature(s) Signed: 06/28/2023 3:27:26 PM By: Angelina Pih Entered By: Angelina Pih on 06/28/2023 14:54:28 [name], [name] Rhodes ([id number]) [id number] Page  8 of 9 -------------------------------------------------------------------------------- Wound Assessment Details Patient Name: Date of Service: [name] [name] 06/28/2023 2:30 PM Medical Record Number: [id number] Patient Account Number:  [id number] Date of Birth/Sex: Treating RN: Jul 03, 1971 (52 y.o. Esmeralda Links Primary Care Jaedyn Marrufo: Eulogio Bear Other Clinician: Referring Evelisse Szalkowski: Treating Hildegarde Dunaway/Extender: Fredrik Rigger, Chioma Weeks in Treatment: 3 Wound Status Wound Number: 1 Primary Atypical Etiology: Wound Location: Right, Lateral Lower Leg Wound Open Wounding Event: Gradually Appeared Status: Date Acquired: 04/06/2023 Comorbid Chronic Obstructive Pulmonary Disease (COPD), Sleep Apnea, Weeks Of Treatment: 3 History: Congestive Heart Failure, Neuropathy Clustered Wound: No Photos Wound Measurements Length: (cm) 4.8 Width: (cm) 4 Depth: (cm) 0.1 Area: (cm) 15.08 Volume: (cm) 1.508 % Reduction in Area: 52% % Reduction in Volume: 52% Epithelialization: Small (1-33%) Tunneling: No Undermining: No Wound Description Classification: Full Thickness Without Exposed Support Structures Exudate Amount: Medium Exudate Type: Serosanguineous Exudate Color: red, brown Foul Odor After Cleansing: No Slough/Fibrino Yes Wound Bed Granulation Amount: Medium (34-66%) Exposed Structure Granulation Quality: Red, Pink Fascia Exposed: No Necrotic Amount: Medium (34-66%) Fat Layer (Subcutaneous Tissue) Exposed: Yes Necrotic Quality: Adherent Slough Tendon Exposed: No Muscle Exposed: No Joint Exposed: No Bone Exposed: No Treatment Notes Wound #1 (Lower Leg) Wound Laterality: Right, Lateral Cleanser Soap and Water Discharge Instruction: Gently cleanse wound with antibacterial soap, rinse and pat dry prior to dressing wounds Vashe 5.8 (oz) [name], [name] ([id number]) [id number] Page 9 of 9 Discharge Instruction: Use vashe 5.8 (oz) as directed Peri-Wound Care Topical Triamcinolone Acetonide Cream, 0.1%, 15 (g) tube Discharge Instruction: for itching Apply as directed by Cordella Nyquist. Primary Dressing Secondary Dressing Xtrasorb Medium 4x5 (in/in) Discharge Instruction:  Apply to wound as directed. Do not cut. Secured With Tubigrip Size D, 3x10 (in/yd) Discharge Instruction: double layer Compression Wrap Compression Stockings Add-Ons Electronic Signature(s) Signed: 06/28/2023 3:27:26 PM By: Angelina Pih Entered By: Angelina Pih on 06/28/2023 14:38:46 -------------------------------------------------------------------------------- Vitals Details Patient Name: Date of Service: [name] [name] 06/28/2023 2:30 PM Medical Record Number: [id number] Patient Account Number: [id number] Date of Birth/Sex: Treating RN: Jan 11, 1971 (52 y.o. Esmeralda Links Primary Care Alferd Obryant: Eulogio Bear Other Clinician: Referring Kieren Ricci: Treating Sakeenah Valcarcel/Extender: Fredrik Rigger, Chioma Weeks in Treatment: 3 Vital Signs Time Taken: 14:30 Temperature (F): 97.8 Height (in): 61 Pulse (bpm): 63 Weight (lbs): 330 Respiratory Rate (breaths/min): 18 Body Mass Index (BMI): 62.3 Blood Pressure (mmHg): 104/73 Reference Range: 80 - 120 mg / dl Electronic Signature(s) Signed: 06/28/2023 3:27:26 PM By: Angelina Pih Entered By: Angelina Pih on 06/28/2023 14:34:34

## 2023-06-29 ENCOUNTER — Ambulatory Visit: Payer: Medicaid Other | Admitting: Physician Assistant

## 2023-07-07 ENCOUNTER — Encounter: Payer: 59 | Admitting: Physician Assistant

## 2023-07-07 DIAGNOSIS — I87331 Chronic venous hypertension (idiopathic) with ulcer and inflammation of right lower extremity: Secondary | ICD-10-CM | POA: Diagnosis not present

## 2023-07-08 NOTE — Progress Notes (Signed)
TEMPLE, HAZELIP ([id number]) [id number] Page 1 of 8 Visit Report for 07/07/2023 Chief Complaint Document Details Patient Name: Date of Service: [name] [name] 07/07/2023 12:30 PM Medical Record Number: [id number] Patient Account Number: [id number] Date of Birth/Sex: Treating RN: [birth year] (52 y.o. [name] [name] Primary Care Provider: Unknown Foley Other Clinician: Referring Provider: Treating Provider/Extender: Fredrik Rigger, Chioma Weeks in Treatment: 4 Information Obtained from: Patient Chief Complaint Right leg venous leg ulcer Electronic Signature(s) Signed: 07/07/2023 1:59:25 PM By: Allen Derry PA-C Entered By: Allen Derry on 07/07/2023 13:59:25 -------------------------------------------------------------------------------- Debridement Details Patient Name: Date of Service: [name] [name] 07/07/2023 12:30 PM Medical Record Number: [id number] Patient Account Number: [id number] Date of Birth/Sex: Treating RN: [birth year] (52 y.o. [name] [name] Primary Care Provider: Unknown Foley Other Clinician: Referring Provider: Treating Provider/Extender: Fredrik Rigger, Chioma Weeks in Treatment: 4 Debridement Performed for Assessment: Wound #1 Right,Lateral Lower Leg Performed By: Physician Allen Derry, PA-C The following information was scribed by: Midge Aver The information was scribed for: Allen Derry Debridement Type: Debridement Level of Consciousness (Pre-procedure): Awake and Alert Pre-procedure Verification/Time Out Yes - 13:22 Taken: Start Time: 13:22 Pain Control: Lidocaine 4% T opical Solution Percent of Wound Bed Debrided: 100% T Area Debrided (cm): otal 12.56 Tissue and other material debrided: Viable, Non-Viable, Slough, Subcutaneous, Slough Level: Skin/Subcutaneous Tissue Debridement Description: Excisional Instrument: Curette Specimen: Swab, Number of Specimens T aken: 1 [name], [name] ([id number])  [id number] Page 2 of 8 Bleeding: Minimum Hemostasis Achieved: Pressure Procedural Pain: 0 Post Procedural Pain: 0 Response to Treatment: Procedure was tolerated well Level of Consciousness (Post- Awake and Alert procedure): Post Debridement Measurements of Total Wound Length: (cm) 4 Width: (cm) 4 Depth: (cm) 0.1 Volume: (cm) 1.257 Character of Wound/Ulcer Post Debridement: Stable Post Procedure Diagnosis Same as Pre-procedure Electronic Signature(s) Signed: 07/07/2023 4:46:15 PM By: Midge Aver MSN RN CNS WTA Signed: 07/07/2023 7:54:12 PM By: Allen Derry PA-C Entered By: Midge Aver on 07/07/2023 13:23:58 -------------------------------------------------------------------------------- HPI Details Patient Name: Date of Service: [name] [name] 07/07/2023 12:30 PM Medical Record Number: [id number] Patient Account Number: [id number] Date of Birth/Sex: Treating RN: 04-Mar-1971 (52 y.o. [name] [name] Primary Care Provider: Unknown Foley Other Clinician: Referring Provider: Treating Provider/Extender: Fredrik Rigger, Chioma Weeks in Treatment: 4 History of Present Illness HPI Description: Notes from Hospital admission at [facility]: Cellulitis  Venous stasis wound: Presented with fairly significant lower extremity edema and weeping of a wound on her right lower extremity. This has been present for 6 to 8 weeks per the patient. A CT on admission demonstrated cellulitis without abscess or osteomyelitis. The superficial wound culture showed Pseudomonas. There were no risk factors otherwise for Pseudomonas. Overall, this was consistent with venous stasis edema and a mild superimposed cellulitis. She was treated initially with vancomycin for 2 days and then topical mupirocin added. Given the superficial culture showing Pseudomonas, started on IV ciprofloxacin as well as oral cephalexin for MSSA for treatment of her cellulitis. Vancomycin was  discontinued. Transitioned from IV to oral ciprofloxacin on the day of discharge, and continued both ciprofloxacin and cephalexin for a total 7-day course on discharge. Blood cultures negative. Pain was managed with Tylenol and oxycodone as needed, which was also prescribed short term on discharge. Dressing changes performed frequently throughout the day due to significant weeping of her wound, with continuation of dressing changes including Silvadene on discharge. Patient has an appointment with the wound care clinic on November 11 at 1:45 PM Prediabetes (A1c 6.4%). Started on metformin 500mg  XL  outpatient. Admission to wound care center [place], [place]: 06-07-2023 upon evaluation today patient appears to be doing somewhat poorly in regard to her right leg. She tells me that she has been having some issues here with infection. She does tell me that overall the infection has been treated in the hospital initially she was noted to have Pseudomonas and some mild staph. Subsequently she was put on Cipro and Keflex. With that being said she still has quite a bit of drainage as well as erythema she is nearing the completion of her antibiotics they gave oral they were given a total of 7 days of each the Keflex and the Cipro I do not think 7 days is gena be enough I discussed that with her today. With that being said I do believe after reviewing the notes above from [facility] but the patient is on the right track I just think we need to Cipro for a bit longer. Patient does have a history of congestive heart failure, nicotine dependence, COPD, prediabetes, and again chronic venous hypertension. 06-14-2023 upon evaluation today patient appears to be doing well currently in regard to her wound. She has been tolerating the dressing changes without complication. Fortunately I do not see any signs of active infection at this time which is great news. No fevers, chills, nausea, vomiting, or diarrhea. Upon  inspection patient's wound bed actually showed signs of good granulation and epithelization at this point. I actually did perform some debridement clearway necrotic debris today she tolerated that debridement without complication and postdebridement the wound bed is significantly improved. ASAL, TIGNOR ([id number]) [id number] Page 3 of 8 06-21-2023 upon evaluation today patient's wound is actually showing signs of significant improvement. This is really good news. Fortunately I do not see any signs of infection and overall I believe that we are making really good headway here towards closure which is great news 06-28-2023 upon evaluation today patient appears to be doing well currently in regard to her wound which is actually showing some signs of improvement this has come along way. With that being said it does have some new skin around the edge she did not have any of the XtraSorb to put on we have tried to get her some of the dressing supplies we had here but unfortunately they did not get a message in time. 07-07-2023 upon evaluation today patient appears to be doing well currently in regard to her wound. She has been tolerating the dressing changes without complication. This is slowly getting smaller and better. With that being said her legs are red but I am not sure this is actually due to cellulitis I am going to perform a little bit of debridement clearway necrotic debris on the 19th a culture as well and we will see what the results of the culture show depending on this finding we will see whether or not we need to initiate any antibiotic therapy. She is actually in agreement with this plan. Electronic Signature(s) Signed: 07/07/2023 5:57:52 PM By: Allen Derry PA-C Entered By: Allen Derry on 07/07/2023 17:57:51 -------------------------------------------------------------------------------- Physical Exam Details Patient Name: Date of Service: [name] [name]  07/07/2023 12:30 PM Medical Record Number: [id number] Patient Account Number: [id number] Date of Birth/Sex: Treating RN: 09-04-1970 (52 y.o. [name] [name] Primary Care Provider: Unknown Foley Other Clinician: Referring Provider: Treating Provider/Extender: Fredrik Rigger, Chioma Weeks in Treatment: 4 Constitutional Well-nourished and well-hydrated in no acute distress. Respiratory normal breathing without difficulty. Psychiatric this patient is able to make decisions  and demonstrates good insight into disease process. Alert and Oriented x 3. pleasant and cooperative. Notes Upon inspection patient's wound bed showed signs of some slough and biofilm on the surface of the wound I did actually perform debridement clearway necrotic debris she tolerated that today without complication and postdebridement wound bed appears to be doing much better which is great news. Electronic Signature(s) Signed: 07/07/2023 5:58:07 PM By: Allen Derry PA-C Entered By: Allen Derry on 07/07/2023 17:58:07 -------------------------------------------------------------------------------- Physician Orders Details Patient Name: Date of Service: [name] [name] 07/07/2023 12:30 PM Medical Record Number: [id number] Patient Account Number: [id number] Date of Birth/Sex: Treating RN: 02/10/1971 (52 y.o. [name] [name] Primary Care Provider: Unknown Foley Other Clinician: TAQUILLA, ECHEVERRY ([id number]) [id number] Page 4 of 8 Referring Provider: Treating Provider/Extender: Fredrik Rigger, Chioma Weeks in Treatment: 4 The following information was scribed by: Midge Aver The information was scribed for: Allen Derry Verbal / Phone Orders: No Diagnosis Coding Follow-up Appointments Return Appointment in 1 week. Nurse Visit as needed Edema Control - Orders / Instructions Elevate, Exercise Daily and A void Standing for Long Periods of Time. Elevate legs to the level of the  heart and pump ankles as often as possible Elevate leg(s) parallel to the floor when sitting. Wound Treatment Wound #1 - Lower Leg Wound Laterality: Right, Lateral Cleanser: Soap and Water 2 x Per Week/30 Days Discharge Instructions: Gently cleanse wound with antibacterial soap, rinse and pat dry prior to dressing wounds Cleanser: Vashe 5.8 (oz) 2 x Per Week/30 Days Discharge Instructions: Use vashe 5.8 (oz) as directed Prim Dressing: Hydrofera Blue Ready Transfer Foam, 2.5x2.5 (in/in) 2 x Per Week/30 Days ary Discharge Instructions: Apply Hydrofera Blue Ready to wound bed as directed Secondary Dressing: Zetuvit Plus 4x4 (in/in) 2 x Per Week/30 Days Secured With: Tubigrip Size D, 3x10 (in/yd) 2 x Per Week/30 Days Discharge Instructions: double layer Laboratory Bacteria identified in Wound by Culture (MICRO) LOINC Code: [phone] Convenience Name: Wound culture routine Electronic Signature(s) Signed: 07/07/2023 4:46:15 PM By: Midge Aver MSN RN CNS WTA Signed: 07/07/2023 7:54:12 PM By: Allen Derry PA-C Entered By: Midge Aver on 07/07/2023 13:43:01 -------------------------------------------------------------------------------- Problem List Details Patient Name: Date of Service: [name] [name] 07/07/2023 12:30 PM Medical Record Number: [id number] Patient Account Number: [id number] Date of Birth/Sex: Treating RN: 1970/09/30 (52 y.o. [name] [name] Primary Care Provider: Unknown Foley Other Clinician: Referring Provider: Treating Provider/Extender: Fredrik Rigger, Chioma Weeks in Treatment: 4 Active Problems ICD-10 Encounter Code Description Active Date MDM Diagnosis SHIRLINE, COELHO ([id number]) [id number] Page 5 of 8 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 06/07/2023 No Yes lower extremity L03.115 Cellulitis of right lower limb 06/07/2023 No Yes L97.812 Non-pressure chronic ulcer of other part of  right lower leg with fat layer 06/07/2023 No Yes exposed R73.03 Prediabetes 06/07/2023 No Yes J44.89 Other specified chronic obstructive pulmonary disease 06/07/2023 No Yes F17.218 Nicotine dependence, cigarettes, with other nicotine-induced disorders 06/07/2023 No Yes I50.42 Chronic combined systolic (congestive) and diastolic (congestive) heart failure 06/07/2023 No Yes Inactive Problems Resolved Problems Electronic Signature(s) Signed: 07/07/2023 1:59:14 PM By: Allen Derry PA-C Entered By: Allen Derry on 07/07/2023 13:59:14 -------------------------------------------------------------------------------- Progress Note Details Patient Name: Date of Service: [name] [name] 07/07/2023 12:30 PM Medical Record Number: [id number] Patient Account Number: [id number] Date of Birth/Sex: Treating RN: 1970-08-31 (52 y.o. [name] [name] Primary Care Provider: Unknown Foley Other Clinician: Referring Provider: Treating Provider/Extender: Fredrik Rigger, Chioma Weeks in Treatment: 4 Subjective Chief Complaint Information obtained from Patient  Right leg venous leg ulcer History of Present Illness (HPI) Notes from Hospital admission at [facility]: Cellulitis  Venous stasis wound: Presented with fairly significant lower extremity edema and weeping of a wound on her right lower extremity. This has been present for 6 to 8 weeks per the patient. A CT on admission demonstrated cellulitis without abscess or osteomyelitis. The superficial wound culture showed Pseudomonas. There were no risk factors otherwise for Pseudomonas. Overall, this was consistent with venous stasis edema and a mild superimposed cellulitis. She was treated initially with vancomycin for 2 days and then topical mupirocin added. Given the superficial culture showing Pseudomonas, started on IV ciprofloxacin as well as oral cephalexin for MSSA for treatment of her cellulitis. Vancomycin was discontinued. Transitioned from  IV to oral ciprofloxacin [name], [name] ([id number]) [id number] Page 6 of 8 on the day of discharge, and continued both ciprofloxacin and cephalexin for a total 7-day course on discharge. Blood cultures negative. Pain was managed with Tylenol and oxycodone as needed, which was also prescribed short term on discharge. Dressing changes performed frequently throughout the day due to significant weeping of her wound, with continuation of dressing changes including Silvadene on discharge. Patient has an appointment with the wound care clinic on November 11 at 1:45 PM Prediabetes (A1c 6.4%). Started on metformin 500mg  XL outpatient. Admission to wound care center [place], [place]: 06-07-2023 upon evaluation today patient appears to be doing somewhat poorly in regard to her right leg. She tells me that she has been having some issues here with infection. She does tell me that overall the infection has been treated in the hospital initially she was noted to have Pseudomonas and some mild staph. Subsequently she was put on Cipro and Keflex. With that being said she still has quite a bit of drainage as well as erythema she is nearing the completion of her antibiotics they gave oral they were given a total of 7 days of each the Keflex and the Cipro I do not think 7 days is gena be enough I discussed that with her today. With that being said I do believe after reviewing the notes above from [facility] but the patient is on the right track I just think we need to Cipro for a bit longer. Patient does have a history of congestive heart failure, nicotine dependence, COPD, prediabetes, and again chronic venous hypertension. 06-14-2023 upon evaluation today patient appears to be doing well currently in regard to her wound. She has been tolerating the dressing changes without complication. Fortunately I do not see any signs of active infection at this time which is great news. No fevers,  chills, nausea, vomiting, or diarrhea. Upon inspection patient's wound bed actually showed signs of good granulation and epithelization at this point. I actually did perform some debridement clearway necrotic debris today she tolerated that debridement without complication and postdebridement the wound bed is significantly improved. 06-21-2023 upon evaluation today patient's wound is actually showing signs of significant improvement. This is really good news. Fortunately I do not see any signs of infection and overall I believe that we are making really good headway here towards closure which is great news 06-28-2023 upon evaluation today patient appears to be doing well currently in regard to her wound which is actually showing some signs of improvement this has come along way. With that being said it does have some new skin around the edge she did not have any of the XtraSorb to put on we have tried to get  her some of the dressing supplies we had here but unfortunately they did not get a message in time. 07-07-2023 upon evaluation today patient appears to be doing well currently in regard to her wound. She has been tolerating the dressing changes without complication. This is slowly getting smaller and better. With that being said her legs are red but I am not sure this is actually due to cellulitis I am going to perform a little bit of debridement clearway necrotic debris on the 19th a culture as well and we will see what the results of the culture show depending on this finding we will see whether or not we need to initiate any antibiotic therapy. She is actually in agreement with this plan. Objective Constitutional Well-nourished and well-hydrated in no acute distress. Vitals Time Taken: 12:42 PM, Height: 61 in, Weight: 330 lbs, BMI: 62.3, Temperature: 97.6 F, Pulse: 81 bpm, Respiratory Rate: 18 breaths/min, Blood Pressure: 121/71 mmHg. Respiratory normal breathing without  difficulty. Psychiatric this patient is able to make decisions and demonstrates good insight into disease process. Alert and Oriented x 3. pleasant and cooperative. General Notes: Upon inspection patient's wound bed showed signs of some slough and biofilm on the surface of the wound I did actually perform debridement clearway necrotic debris she tolerated that today without complication and postdebridement wound bed appears to be doing much better which is great news. Integumentary (Hair, Skin) Wound #1 status is Open. Original cause of wound was Gradually Appeared. The date acquired was: 04/06/2023. The wound has been in treatment 4 weeks. The wound is located on the Right,Lateral Lower Leg. The wound measures 4cm length x 4cm width x 0.1cm depth; 12.566cm^2 area and 1.257cm^3 volume. There is Fat Layer (Subcutaneous Tissue) exposed. There is a medium amount of serosanguineous drainage noted. There is medium (34-66%) red, pink granulation within the wound bed. There is a medium (34-66%) amount of necrotic tissue within the wound bed including Adherent Slough. Assessment Active Problems ICD-10 Chronic venous hypertension (idiopathic) with ulcer and inflammation of right lower extremity Cellulitis of right lower limb Non-pressure chronic ulcer of other part of right lower leg with fat layer exposed Prediabetes Other specified chronic obstructive pulmonary disease Nicotine dependence, cigarettes, with other nicotine-induced disorders Chronic combined systolic (congestive) and diastolic (congestive) heart failure [name], [name] ([id number]) [id number] Page 7 of 8 Procedures Wound #1 Pre-procedure diagnosis of Wound #1 is an Atypical located on the Right,Lateral Lower Leg . There was a Excisional Skin/Subcutaneous Tissue Debridement with a total area of 12.56 sq cm performed by Allen Derry, PA-C. With the following instrument(s): Curette to remove Viable and Non-Viable  tissue/material. Material removed includes Subcutaneous Tissue and Slough and after achieving pain control using Lidocaine 4% T opical Solution. 1 specimen was taken by a Swab and sent to the lab per facility protocol. A time out was conducted at 13:22, prior to the start of the procedure. A Minimum amount of bleeding was controlled with Pressure. The procedure was tolerated well with a pain level of 0 throughout and a pain level of 0 following the procedure. Post Debridement Measurements: 4cm length x 4cm width x 0.1cm depth; 1.257cm^3 volume. Character of Wound/Ulcer Post Debridement is stable. Post procedure Diagnosis Wound #1: Same as Pre-Procedure Plan Follow-up Appointments: Return Appointment in 1 week. Nurse Visit as needed Edema Control - Orders / Instructions: Elevate, Exercise Daily and Avoid Standing for Long Periods of Time. Elevate legs to the level of the heart and pump ankles as often as possible  Elevate leg(s) parallel to the floor when sitting. Laboratory ordered were: Wound culture routine WOUND #1: - Lower Leg Wound Laterality: Right, Lateral Cleanser: Soap and Water 2 x Per Week/30 Days Discharge Instructions: Gently cleanse wound with antibacterial soap, rinse and pat dry prior to dressing wounds Cleanser: Vashe 5.8 (oz) 2 x Per Week/30 Days Discharge Instructions: Use vashe 5.8 (oz) as directed Prim Dressing: Hydrofera Blue Ready Transfer Foam, 2.5x2.5 (in/in) 2 x Per Week/30 Days ary Discharge Instructions: Apply Hydrofera Blue Ready to wound bed as directed Secondary Dressing: Zetuvit Plus 4x4 (in/in) 2 x Per Week/30 Days Secured With: Tubigrip Size D, 3x10 (in/yd) 2 x Per Week/30 Days Discharge Instructions: double layer 1. I did obtain a wound culture postdebridement once I cleaned off the wound and we will see what the results of this culture show. 2. I am good recommend we continue with the Hydrofera Blue followed by Zetuvit. 3 we will continue with Tubigrip  size D which I think is still can be the best way to go. We will see patient back for reevaluation in 1 week here in the clinic. If anything worsens or changes patient will contact our office for additional recommendations. Electronic Signature(s) Signed: 07/07/2023 6:00:13 PM By: Allen Derry PA-C Entered By: Allen Derry on 07/07/2023 18:00:13 -------------------------------------------------------------------------------- SuperBill Details Patient Name: Date of Service: [name] [name] 07/07/2023 Medical Record Number: [id number] Patient Account Number: [id number] Date of Birth/Sex: Treating RN: 1971-06-14 (52 y.o. [name] [name] Primary Care Provider: Unknown Foley Other Clinician: Referring Provider: Treating Provider/Extender: Fredrik Rigger, Chioma Weeks in Treatment: 4 Diagnosis Coding ICD-10 Codes Code Description KINARA, BOBIAN ([id number]) [id number] Page 8 of 8 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 lower extremity L03.115 Cellulitis of right lower limb L97.812 Non-pressure chronic ulcer of other part of right lower leg with fat layer exposed R73.03 Prediabetes J44.89 Other specified chronic obstructive pulmonary disease F17.218 Nicotine dependence, cigarettes, with other nicotine-induced disorders I50.42 Chronic combined systolic (congestive) and diastolic (congestive) heart failure Facility Procedures : CPT4 Code: [id number] Description: 11042 - DEB SUBQ TISSUE 20 SQ CM/< ICD-10 Diagnosis Description L97.812 Non-pressure chronic ulcer of other part of right lower leg with fat layer exp Modifier: osed Quantity: 1 Physician Procedures : CPT4 Code Description Modifier 11042 11042 - WC PHYS SUBQ TISS 20 SQ CM ICD-10 Diagnosis Description L97.812 Non-pressure chronic ulcer of other part of right lower leg with fat layer exposed Quantity: 1 Electronic Signature(s) Signed: 07/07/2023 6:00:27  PM By: Allen Derry PA-C Entered By: Allen Derry on 07/07/2023 18:00:26

## 2023-07-08 NOTE — Progress Notes (Signed)
HAVEN, KEEZER ([id number]) [id number] Page 1 of 9 Visit Report for 07/07/2023 Arrival Information Details Patient Name: Date of Service: [name], [name] 07/07/2023 12:30 PM Medical Record Number: [id number] Patient Account Number: [id number] Date of Birth/Sex: Treating RN: [birth year] (52 y.o. [name] Primary Care [name]: [name] Other Clinician: Referring [name]: Treating [name]/Extender: [name], [name] in Treatment: 4 Visit Information History Since Last Visit Added or deleted any medications: No Patient Arrived: Ambulatory Any new allergies or adverse reactions: No Arrival Time: 12:38 Had a fall or experienced change in No Accompanied By: sister activities of daily living that may affect Transfer Assistance: None risk of falls: Patient Identification Verified: Yes Hospitalized since last visit: No Secondary Verification Process Completed: Yes Has Dressing in Place as Prescribed: Yes Patient Requires Transmission-Based Precautions: No Pain Present Now: No Patient Has Alerts: Yes Patient Alerts: Prediabetic Electronic Signature(s) Signed: 07/07/2023 4:46:15 PM By: [name] Aver MSN RN CNS WTA Entered By: [name] on 07/07/2023 09:42:28 -------------------------------------------------------------------------------- Clinic Level of Care Assessment Details Patient Name: Date of Service: [name], [name] 07/07/2023 12:30 PM Medical Record Number: [id number] Patient Account Number: [id number] Date of Birth/Sex: Treating RN: 06/17/1971 (52 y.o. [name] Primary Care Tempest Frankland: [name] Other Clinician: Referring [name]: Treating [name]/Extender: [name], [name] in Treatment: 4 Clinic Level of Care Assessment Items TOOL 1 Quantity Score []  - 0 Use when EandM and Procedure is performed on INITIAL visit ASSESSMENTS - Nursing Assessment / Reassessment []  - 0 General  Physical Exam (combine w/ comprehensive assessment (listed just below) when performed on new pt. evals) []  - 0 Comprehensive Assessment (HX, ROS, Risk Assessments, Wounds Hx, etc.) ASSESSMENTS - Wound and Skin Assessment / Reassessment []  - 0 Dermatologic / Skin Assessment (not related to wound area) ASSESSMENTS - Ostomy and/or Continence Assessment and Care [name], [name] ([id number]) [id number] Page 2 of 9 []  - 0 Incontinence Assessment and Management []  - 0 Ostomy Care Assessment and Management (repouching, etc.) PROCESS - Coordination of Care []  - 0 Simple Patient / Family Education for ongoing care []  - 0 Complex (extensive) Patient / Family Education for ongoing care []  - 0 Staff obtains Chiropractor, Records, T Results / Process Orders est []  - 0 Staff telephones HHA, Nursing Homes / Clarify orders / etc []  - 0 Routine Transfer to another Facility (non-emergent condition) []  - 0 Routine Hospital Admission (non-emergent condition) []  - 0 New Admissions / Manufacturing engineer / Ordering NPWT Apligraf, etc. , []  - 0 Emergency Hospital Admission (emergent condition) PROCESS - Special Needs []  - 0 Pediatric / Minor Patient Management []  - 0 Isolation Patient Management []  - 0 Hearing / Language / Visual special needs []  - 0 Assessment of Community assistance (transportation, D/C planning, etc.) []  - 0 Additional assistance / Altered mentation []  - 0 Support Surface(s) Assessment (bed, cushion, seat, etc.) INTERVENTIONS - Miscellaneous []  - 0 External ear exam []  - 0 Patient Transfer (multiple staff / Nurse, adult / Similar devices) []  - 0 Simple Staple / Suture removal (25 or less) []  - 0 Complex Staple / Suture removal (26 or more) []  - 0 Hypo/Hyperglycemic Management (do not check if billed separately) []  - 0 Ankle / Brachial Index (ABI) - do not check if billed separately Has the patient been seen at the hospital within the last  three years: Yes Total Score: 0 Level Of Care: ____ Electronic Signature(s) Signed: 07/07/2023 4:46:15 PM By: [name] Aver MSN RN CNS WTA Entered By: [name]  on 07/07/2023 10:41:57 -------------------------------------------------------------------------------- Encounter Discharge Information Details Patient Name: Date of Service: [name], [name] 07/07/2023 12:30 PM Medical Record Number: [id number] Patient Account Number: [id number] Date of Birth/Sex: Treating RN: [birth year] (52 y.o. [name] Primary Care [name]: [name] Other Clinician: Referring [name]: Treating [name]/Extender: [name], [name] in Treatment: 4 Encounter Discharge Information Items Post Procedure Vitals Discharge Condition: Stable Temperature (F): 97.6 [name], [name] ([id number]) [id number] Page 3 of 9 Ambulatory Status: Ambulatory Pulse (bpm): 81 Discharge Destination: Home Respiratory Rate (breaths/min): 18 Transportation: Private Auto Blood Pressure (mmHg): 121/71 Accompanied By: sister Schedule Follow-up Appointment: Yes Clinical Summary of Care: Electronic Signature(s) Signed: 07/07/2023 4:46:15 PM By: [name] Aver MSN RN CNS WTA Entered By: [name] on 07/07/2023 10:44:05 -------------------------------------------------------------------------------- Lower Extremity Assessment Details Patient Name: Date of Service: [name], [name] 07/07/2023 12:30 PM Medical Record Number: [id number] Patient Account Number: [id number] Date of Birth/Sex: Treating RN: [birth year] (52 y.o. [name] Primary Care [name]: [name] Other Clinician: Referring [name]: Treating [name]/Extender: [name], [name] in Treatment: 4 Edema Assessment Assessed: [Left: No] [Right: No] [Left: Edema] [Right: :] Calf Left: Right: Point of Measurement: 33 cm From Medial Instep 49 cm Ankle Left: Right: Point of  Measurement: 12 cm From Medial Instep 27 cm Vascular Assessment Pulses: Dorsalis Pedis Palpable: [Right:Yes] Extremity colors, hair growth, and conditions: Extremity Color: [Right:Red] Hair Growth on Extremity: [Right:Yes] Temperature of Extremity: [Right:Warm] Capillary Refill: [Right:< 3 seconds] Dependent Rubor: [Right:No] Blanched when Elevated: [Right:No No] Toe Nail Assessment Left: Right: Thick: No Discolored: No Deformed: No Improper Length and Hygiene: No Electronic Signature(s) Signed: 07/07/2023 4:46:15 PM By: [name] Aver MSN RN CNS WTA Entered By: [name] on 07/07/2023 09:50:24 [name] ([id number]) [id number] Page 4 of 9 -------------------------------------------------------------------------------- Multi Wound Chart Details Patient Name: Date of Service: [name], Kiriana 07/07/2023 12:30 PM Medical Record Number: [id number] Patient Account Number: [id number] Date of Birth/Sex: Treating RN: January 09, 1971 (52 y.o. [name] Primary Care Ruthie Berch: [name] Other Clinician: Referring Vincenza Dail: Treating Cayetano Mikita/Extender: [name], [name] in Treatment: 4 Vital Signs Height(in): 61 Pulse(bpm): 81 Weight(lbs): 330 Blood Pressure(mmHg): 121/71 Body Mass Index(BMI): 62.3 Temperature(F): 97.6 Respiratory Rate(breaths/min): 18 [1:Photos:] [N/A:N/A] Right, Lateral Lower Leg N/A N/A Wound Location: Gradually Appeared N/A N/A Wounding Event: Atypical N/A N/A Primary Etiology: Chronic Obstructive Pulmonary N/A N/A Comorbid History: Disease (COPD), Sleep Apnea, Congestive Heart Failure, Neuropathy 04/06/2023 N/A N/A Date Acquired: 4 N/A N/A [name] of Treatment: Open N/A N/A Wound Status: No N/A N/A Wound Recurrence: 4x4x0.1 N/A N/A Measurements L x W x D (cm) 12.566 N/A N/A A (cm) : rea 1.257 N/A N/A Volume (cm) : 60.00% N/A N/A % Reduction in A rea: 60.00% N/A N/A % Reduction in  Volume: Full Thickness Without Exposed N/A N/A Classification: Support Structures Medium N/A N/A Exudate A mount: Serosanguineous N/A N/A Exudate Type: red, brown N/A N/A Exudate Color: Medium (34-66%) N/A N/A Granulation A mount: Red, Pink N/A N/A Granulation Quality: Medium (34-66%) N/A N/A Necrotic A mount: Fat Layer (Subcutaneous Tissue): Yes N/A N/A Exposed Structures: Fascia: No Tendon: No Muscle: No Joint: No Bone: No Small (1-33%) N/A N/A Epithelialization: Debridement - Excisional N/A N/A Debridement: Pre-procedure Verification/Time Out 13:22 N/A N/A Taken: Lidocaine 4% Topical Solution N/A N/A Pain Control: Subcutaneous, Slough N/A N/A Tissue Debrided: Skin/Subcutaneous Tissue N/A N/A Level: 12.56 N/A N/A Debridement A (sq cm): rea Curette N/A N/A Instrument: Swab N/A N/A SpecimenCRISTABEL, [name] ([id number]) [id number] Page 5 of 9 1  N/A N/A Number of Specimens Taken: Minimum N/A N/A Bleeding: Pressure N/A N/A Hemostasis Achieved: 0 N/A N/A Procedural Pain: 0 N/A N/A Post Procedural Pain: Procedure was tolerated well N/A N/A Debridement Treatment Response: 4x4x0.1 N/A N/A Post Debridement Measurements L x W x D (cm) 1.257 N/A N/A Post Debridement Volume: (cm) Debridement N/A N/A Procedures Performed: Treatment Notes Wound #1 (Lower Leg) Wound Laterality: Right, Lateral Cleanser Soap and Water Discharge Instruction: Gently cleanse wound with antibacterial soap, rinse and pat dry prior to dressing wounds Vashe 5.8 (oz) Discharge Instruction: Use vashe 5.8 (oz) as directed Peri-Wound Care Topical Primary Dressing Hydrofera Blue Ready Transfer Foam, 2.5x2.5 (in/in) Discharge Instruction: Apply Hydrofera Blue Ready to wound bed as directed Secondary Dressing Zetuvit Plus 4x4 (in/in) Secured With Tubigrip Size D, 3x10 (in/yd) Discharge Instruction: double layer Compression Wrap Compression  Stockings Add-Ons Electronic Signature(s) Signed: 07/07/2023 2:38:55 PM By: [name] Aver MSN RN CNS WTA Entered By: [name] on 07/07/2023 11:38:54 -------------------------------------------------------------------------------- Multi-Disciplinary Care Plan Details Patient Name: Date of Service: [name], Jailynn 07/07/2023 12:30 PM Medical Record Number: [id number] Patient Account Number: [id number] Date of Birth/Sex: Treating RN: [birth year] (52 y.o. [name] Primary Care Chania Kochanski: [name] Other Clinician: Referring Valery Chance: Treating Beula Joyner/Extender: [name], [name] in Treatment: 4 Active Inactive Wound/Skin Impairment Nursing Diagnoses: [name], [name] ([id number]) [id number] Page 6 of 9 Impaired tissue integrity Knowledge deficit related to smoking impact on wound healing Knowledge deficit related to ulceration/compromised skin integrity Goals: Patient will demonstrate a reduced rate of smoking or cessation of smoking Date Initiated: 06/07/2023 Date Inactivated: 07/07/2023 Target Resolution Date: 07/08/2023 Goal Status: Met Patient/caregiver will verbalize understanding of skin care regimen Date Initiated: 06/07/2023 Date Inactivated: 07/07/2023 Target Resolution Date: 07/07/2023 Goal Status: Met Ulcer/skin breakdown will have a volume reduction of 30% by week 4 Date Initiated: 06/07/2023 Date Inactivated: 07/07/2023 Target Resolution Date: 07/07/2023 Goal Status: Met Ulcer/skin breakdown will have a volume reduction of 50% by week 8 Date Initiated: 06/07/2023 Target Resolution Date: 08/07/2023 Goal Status: Active Ulcer/skin breakdown will have a volume reduction of 80% by week 12 Date Initiated: 06/07/2023 Target Resolution Date: 09/07/2023 Goal Status: Active Interventions: Assess patient/caregiver ability to obtain necessary supplies Assess patient/caregiver ability to perform ulcer/skin care regimen  upon admission and as needed Assess ulceration(s) every visit Provide education on smoking Provide education on ulcer and skin care Treatment Activities: Skin care regimen initiated : 06/07/2023 Notes: Electronic Signature(s) Signed: 07/07/2023 4:46:15 PM By: [name] Aver MSN RN CNS WTA Entered By: [name] on 07/07/2023 10:42:18 -------------------------------------------------------------------------------- Pain Assessment Details Patient Name: Date of Service: [name], Marshall 07/07/2023 12:30 PM Medical Record Number: [id number] Patient Account Number: [id number] Date of Birth/Sex: Treating RN: 22-Mar-1971 (52 y.o. [name] Primary Care Otilio Groleau: [name] Other Clinician: Referring Lashaun Krapf: Treating Otillia Cordone/Extender: [name], [name] in Treatment: 4 Active Problems Location of Pain Severity and Description of Pain Patient Has Paino Yes Site Locations Rate the pain. [name], [name] ([id number]) [id number] Page 7 of 9 Rate the pain. Current Pain Level: 7 Pain Management and Medication Current Pain Management: Electronic Signature(s) Signed: 07/07/2023 4:46:15 PM By: [name] Aver MSN RN CNS WTA Entered By: [name] on 07/07/2023 09:43:04 -------------------------------------------------------------------------------- Patient/Caregiver Education Details Patient Name: Date of Service: [name], Jerre 12/11/2024andnbsp12:30 PM Medical Record Number: [id number] Patient Account Number: [id number] Date of Birth/Gender: Treating RN: 21-Sep-1970 (52 y.o. [name] Primary Care Physician: [name] Other Clinician: Referring Physician: Treating Physician/Extender: [name], [name] in Treatment:  4 Education Assessment Education Provided To: Patient Education Topics Provided Wound/Skin Impairment: Handouts: Caring for Your Ulcer Methods: Explain/Verbal Responses: State content  correctly Electronic Signature(s) Signed: 07/07/2023 4:46:15 PM By: [name] Aver MSN RN CNS WTA Entered By: [name] on 07/07/2023 10:42:29 [name] ([id number]) [id number] Page 8 of 9 -------------------------------------------------------------------------------- Wound Assessment Details Patient Name: Date of Service: [name], Lulani 07/07/2023 12:30 PM Medical Record Number: [id number] Patient Account Number: [id number] Date of Birth/Sex: Treating RN: 1971-07-19 (52 y.o. [name] Primary Care Braylen Denunzio: [name] Other Clinician: Referring Denorris Reust: Treating Ariatna Jester/Extender: [name], [name] in Treatment: 4 Wound Status Wound Number: 1 Primary Atypical Etiology: Wound Location: Right, Lateral Lower Leg Wound Open Wounding Event: Gradually Appeared Status: Date Acquired: 04/06/2023 Comorbid Chronic Obstructive Pulmonary Disease (COPD), Sleep Apnea, [name] Of Treatment: 4 History: Congestive Heart Failure, Neuropathy Clustered Wound: No Photos Wound Measurements Length: (cm) 4 Width: (cm) 4 Depth: (cm) 0.1 Area: (cm) 12.566 Volume: (cm) 1.257 % Reduction in Area: 60% % Reduction in Volume: 60% Epithelialization: Small (1-33%) Wound Description Classification: Full Thickness Without Exposed Support Structures Exudate Amount: Medium Exudate Type: Serosanguineous Exudate Color: red, brown Foul Odor After Cleansing: No Slough/Fibrino Yes Wound Bed Granulation Amount: Medium (34-66%) Exposed Structure Granulation Quality: Red, Pink Fascia Exposed: No Necrotic Amount: Medium (34-66%) Fat Layer (Subcutaneous Tissue) Exposed: Yes Necrotic Quality: Adherent Slough Tendon Exposed: No Muscle Exposed: No Joint Exposed: No Bone Exposed: No Treatment Notes Wound #1 (Lower Leg) Wound Laterality: Right, Lateral Cleanser Soap and Water Discharge Instruction: Gently cleanse wound with antibacterial soap,  rinse and pat dry prior to dressing wounds Vashe 5.8 (oz) [name], [name] ([id number]) [id number] Page 9 of 9 Discharge Instruction: Use vashe 5.8 (oz) as directed Peri-Wound Care Topical Primary Dressing Hydrofera Blue Ready Transfer Foam, 2.5x2.5 (in/in) Discharge Instruction: Apply Hydrofera Blue Ready to wound bed as directed Secondary Dressing Zetuvit Plus 4x4 (in/in) Secured With Tubigrip Size D, 3x10 (in/yd) Discharge Instruction: double layer Compression Wrap Compression Stockings Add-Ons Electronic Signature(s) Signed: 07/07/2023 4:46:15 PM By: [name] Aver MSN RN CNS WTA Entered By: [name] on 07/07/2023 09:48:37 -------------------------------------------------------------------------------- Vitals Details Patient Name: Date of Service: [name], Quintella 07/07/2023 12:30 PM Medical Record Number: [id number] Patient Account Number: [id number] Date of Birth/Sex: Treating RN: 30-Oct-1970 (52 y.o. [name] Primary Care Micheil Klaus: [name] Other Clinician: Referring Layney Gillson: Treating Derotha Fishbaugh/Extender: [name], [name] in Treatment: 4 Vital Signs Time Taken: 12:42 Temperature (F): 97.6 Height (in): 61 Pulse (bpm): 81 Weight (lbs): 330 Respiratory Rate (breaths/min): 18 Body Mass Index (BMI): 62.3 Blood Pressure (mmHg): 121/71 Reference Range: 80 - 120 mg / dl Electronic Signature(s) Signed: 07/07/2023 4:46:15 PM By: [name] Aver MSN RN CNS WTA Entered By: [name] on 07/07/2023 09:42:49

## 2023-07-14 ENCOUNTER — Encounter: Payer: 59 | Admitting: Physician Assistant

## 2023-07-14 DIAGNOSIS — I87331 Chronic venous hypertension (idiopathic) with ulcer and inflammation of right lower extremity: Secondary | ICD-10-CM | POA: Diagnosis not present

## 2023-07-15 NOTE — Progress Notes (Addendum)
MERVA, MIRACLE ([id number]) [id number] Page 1 of 8 Visit Report for 07/14/2023 Chief Complaint Document Details Patient Name: Date of Service: [name] [name] 07/14/2023 2:45 PM Medical Record Number: [id number] Patient Account Number: [id number] Date of Birth/Sex: Treating RN: 07-16-1971 (52 y.o. [name] Primary Care Provider: Unknown Foley Other Clinician: Betha Loa Referring Provider: Treating Provider/Extender: Jacklyn Shell Weeks in Treatment: 5 Information Obtained from: Patient Chief Complaint Right leg venous leg ulcer Electronic Signature(s) Signed: 07/14/2023 3:00:57 PM By: Allen Derry PA-C Entered By: Allen Derry on 07/14/2023 12:00:56 -------------------------------------------------------------------------------- Debridement Details Patient Name: Date of Service: [name] [name] 07/14/2023 2:45 PM Medical Record Number: [id number] Patient Account Number: [id number] Date of Birth/Sex: Treating RN: October 28, 1970 (52 y.o. [name] Primary Care Provider: Unknown Foley Other Clinician: Betha Loa Referring Provider: Treating Provider/Extender: Jacklyn Shell Weeks in Treatment: 5 Debridement Performed for Assessment: Wound #1 Right,Lateral Lower Leg Performed By: Physician Allen Derry, PA-C Debridement Type: Debridement Level of Consciousness (Pre-procedure): Awake and Alert Pre-procedure Verification/Time Out Yes - 15:11 Taken: Start Time: 15:11 Percent of Wound Bed Debrided: 100% T Area Debrided (cm): otal 8.71 Tissue and other material debrided: Viable, Non-Viable, Slough, Skin: Dermis , Skin: Epidermis, Slough Level: Skin/Epidermis Debridement Description: Selective/Open Wound Instrument: Curette Bleeding: Minimum Hemostasis Achieved: Pressure Response to Treatment: Procedure was tolerated well Level of Consciousness (Post- Awake and Alert procedure): [name] [name] ([id number])  [id number] Page 2 of 8 Post Debridement Measurements of Total Wound Length: (cm) 3.7 Width: (cm) 3 Depth: (cm) 0.1 Volume: (cm) 0.872 Character of Wound/Ulcer Post Debridement: Stable Post Procedure Diagnosis Same as Pre-procedure Electronic Signature(s) Signed: 07/16/2023 11:21:11 AM By: Yevonne Pax RN Signed: 07/16/2023 12:10:58 PM By: Betha Loa Signed: 07/16/2023 12:16:54 PM By: Allen Derry PA-C Entered By: Betha Loa on 07/14/2023 12:13:46 -------------------------------------------------------------------------------- HPI Details Patient Name: Date of Service: [name] [name] 07/14/2023 2:45 PM Medical Record Number: [id number] Patient Account Number: [id number] Date of Birth/Sex: Treating RN: March 19, 1971 (52 y.o. [name] Primary Care Provider: Unknown Foley Other Clinician: Betha Loa Referring Provider: Treating Provider/Extender: Jacklyn Shell Weeks in Treatment: 5 History of Present Illness HPI Description: Notes from Hospital admission at [facility]: Cellulitis  Venous stasis wound: Presented with fairly significant lower extremity edema and weeping of a wound on her right lower extremity. This has been present for 6 to 8 weeks per the patient. A CT on admission demonstrated cellulitis without abscess or osteomyelitis. The superficial wound culture showed Pseudomonas. There were no risk factors otherwise for Pseudomonas. Overall, this was consistent with venous stasis edema and a mild superimposed cellulitis. She was treated initially with vancomycin for 2 days and then topical mupirocin added. Given the superficial culture showing Pseudomonas, started on IV ciprofloxacin as well as oral cephalexin for MSSA for treatment of her cellulitis. Vancomycin was discontinued. Transitioned from IV to oral ciprofloxacin on the day of discharge, and continued both ciprofloxacin and cephalexin for a total 7-day  course on discharge. Blood cultures negative. Pain was managed with Tylenol and oxycodone as needed, which was also prescribed short term on discharge. Dressing changes performed frequently throughout the day due to significant weeping of her wound, with continuation of dressing changes including Silvadene on discharge. Patient has an appointment with the wound care clinic on November 11 at 1:45 PM Prediabetes (A1c 6.4%). Started on metformin 500mg  XL outpatient. Admission to wound care center [place], [place]: 06-07-2023 upon evaluation today patient appears to be doing somewhat poorly in regard to her  right leg. She tells me that she has been having some issues here with infection. She does tell me that overall the infection has been treated in the hospital initially she was noted to have Pseudomonas and some mild staph. Subsequently she was put on Cipro and Keflex. With that being said she still has quite a bit of drainage as well as erythema she is nearing the completion of her antibiotics they gave oral they were given a total of 7 days of each the Keflex and the Cipro I do not think 7 days is gena be enough I discussed that with her today. With that being said I do believe after reviewing the notes above from [facility] but the patient is on the right track I just think we need to Cipro for a bit longer. Patient does have a history of congestive heart failure, nicotine dependence, COPD, prediabetes, and again chronic venous hypertension. 06-14-2023 upon evaluation today patient appears to be doing well currently in regard to her wound. She has been tolerating the dressing changes without complication. Fortunately I do not see any signs of active infection at this time which is great news. No fevers, chills, nausea, vomiting, or diarrhea. Upon inspection patient's wound bed actually showed signs of good granulation and epithelization at this point. I actually did perform some  debridement clearway necrotic debris today she tolerated that debridement without complication and postdebridement the wound bed is significantly improved. 06-21-2023 upon evaluation today patient's wound is actually showing signs of significant improvement. This is really good news. Fortunately I do not see any signs of infection and overall I believe that we are making really good headway here towards closure which is great news 06-28-2023 upon evaluation today patient appears to be doing well currently in regard to her wound which is actually showing some signs of improvement this has come along way. With that being said it does have some new skin around the edge she did not have any of the XtraSorb to put on we have tried to get her some of the dressing supplies we had here but unfortunately they did not get a message in time. 07-07-2023 upon evaluation today patient appears to be doing well currently in regard to her wound. She has been tolerating the dressing changes without Mcchesney, [name] [name] ([id number]) [id number] Page 3 of 8 complication. This is slowly getting smaller and better. With that being said her legs are red but I am not sure this is actually due to cellulitis I am going to perform a little bit of debridement clearway necrotic debris on the 19th a culture as well and we will see what the results of the culture show depending on this finding we will see whether or not we need to initiate any antibiotic therapy. She is actually in agreement with this plan. 07-14-2023 upon evaluation today patient appears to be doing well currently in regard to her wound. She has been tolerating the dressing changes without complication. Fortunately I think her making good progress she does have a bacterial infection noted I did send in the antibiotic for her she is can be picking that up today that is Augmentin. She has not gotten it filled as of yet. Electronic  Signature(s) Signed: 07/14/2023 3:25:09 PM By: Allen Derry PA-C Entered By: Allen Derry on 07/14/2023 12:25:09 -------------------------------------------------------------------------------- Physical Exam Details Patient Name: Date of Service: [name] [name], Lateka 07/14/2023 2:45 PM Medical Record Number: [id number] Patient Account Number: [id number] Date of Birth/Sex: Treating RN: 01-28-1971 (52  y.o. [name] Primary Care Provider: Unknown Foley Other Clinician: Betha Loa Referring Provider: Treating Provider/Extender: Jacklyn Shell Weeks in Treatment: 5 Constitutional Well-nourished and well-hydrated in no acute distress. Respiratory normal breathing without difficulty. Psychiatric this patient is able to make decisions and demonstrates good insight into disease process. Alert and Oriented x 3. pleasant and cooperative. Notes Upon inspection patient's wound bed actually showed signs of good granulation epithelization at this point. Fortunately I do not see any signs of active infection currently and I do believe that she is making really good headway here towards closure. Electronic Signature(s) Signed: 07/14/2023 3:25:30 PM By: Allen Derry PA-C Entered By: Allen Derry on 07/14/2023 12:25:29 -------------------------------------------------------------------------------- Physician Orders Details Patient Name: Date of Service: [name] [name] 07/14/2023 2:45 PM Medical Record Number: [id number] Patient Account Number: [id number] Date of Birth/Sex: Treating RN: 1971-03-12 (52 y.o. [name] Primary Care Provider: Unknown Foley Other Clinician: Betha Loa Referring Provider: Treating Provider/Extender: Jacklyn Shell Weeks in Treatment: 5 The following information was scribed by: Betha Loa The information was scribed for: [name] [name], [name] [name] ([id number]) [id number] Page 4 of 8 Verbal / Phone  Orders: No Diagnosis Coding ICD-10 Coding Code Description 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 lower extremity L03.115 Cellulitis of right lower limb L97.812 Non-pressure chronic ulcer of other part of right lower leg with fat layer exposed R73.03 Prediabetes J44.89 Other specified chronic obstructive pulmonary disease F17.218 Nicotine dependence, cigarettes, with other nicotine-induced disorders I50.42 Chronic combined systolic (congestive) and diastolic (congestive) heart failure Follow-up Appointments Return Appointment in 1 week. Nurse Visit as needed Edema Control - Orders / Instructions Elevate, Exercise Daily and A void Standing for Long Periods of Time. Elevate legs to the level of the heart and pump ankles as often as possible Elevate leg(s) parallel to the floor when sitting. Wound Treatment Wound #1 - Lower Leg Wound Laterality: Right, Lateral Cleanser: Soap and Water 2 x Per Week/30 Days Discharge Instructions: Gently cleanse wound with antibacterial soap, rinse and pat dry prior to dressing wounds Cleanser: Vashe 5.8 (oz) 2 x Per Week/30 Days Discharge Instructions: Use vashe 5.8 (oz) as directed Prim Dressing: Hydrofera Blue Ready Transfer Foam, 2.5x2.5 (in/in) 2 x Per Week/30 Days ary Discharge Instructions: Apply Hydrofera Blue Ready to wound bed as directed Secondary Dressing: Zetuvit Plus 4x4 (in/in) 2 x Per Week/30 Days Secured With: Tubigrip Size D, 3x10 (in/yd) 2 x Per Week/30 Days Discharge Instructions: double layer Electronic Signature(s) Signed: 07/16/2023 12:10:58 PM By: Betha Loa Signed: 07/16/2023 12:16:54 PM By: Allen Derry PA-C Entered By: Betha Loa on 07/14/2023 12:14:00 -------------------------------------------------------------------------------- Problem List Details Patient Name: Date of Service: [name] [name] 07/14/2023 2:45 PM Medical Record Number: [id number] Patient Account  Number: [id number] Date of Birth/Sex: Treating RN: 1971-04-22 (52 y.o. [name] Primary Care Provider: Unknown Foley Other Clinician: Betha Loa Referring Provider: Treating Provider/Extender: Jacklyn Shell Weeks in Treatment: 5 Active Problems ICD-10 Encounter Code Description Active Date MDM SALICIA, NURNBERG ([id number]) [id number] Page 5 of 8 Code Description Active Date MDM Diagnosis 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 06/07/2023 No Yes lower extremity L03.115 Cellulitis of right lower limb 06/07/2023 No Yes L97.812 Non-pressure chronic ulcer of other part of right lower leg with fat layer 06/07/2023 No Yes exposed R73.03 Prediabetes 06/07/2023 No Yes J44.89 Other specified chronic obstructive pulmonary disease 06/07/2023 No Yes F17.218 Nicotine dependence, cigarettes, with other nicotine-induced disorders 06/07/2023 No Yes  I50.42 Chronic combined systolic (congestive) and diastolic (congestive) heart failure 06/07/2023 No Yes Inactive Problems Resolved Problems Electronic Signature(s) Signed: 07/14/2023 3:00:52 PM By: Allen Derry PA-C Entered By: Allen Derry on 07/14/2023 12:00:52 -------------------------------------------------------------------------------- Progress Note Details Patient Name: Date of Service: [name] [name] 07/14/2023 2:45 PM Medical Record Number: [id number] Patient Account Number: [id number] Date of Birth/Sex: Treating RN: 1970-09-06 (52 y.o. [name] Primary Care Provider: Unknown Foley Other Clinician: Betha Loa Referring Provider: Treating Provider/Extender: Jacklyn Shell Weeks in Treatment: 5 Subjective Chief Complaint Information obtained from Patient Right leg venous leg ulcer History of Present Illness (HPI) Notes from Hospital admission at [facility]: Cellulitis  Venous stasis wound: Presented with fairly  significant lower extremity edema and weeping of a wound on her right lower extremity. This has been present for 6 to 8 weeks per the patient. A CT on admission demonstrated cellulitis without abscess or osteomyelitis. The superficial wound culture showed Pseudomonas. There were no risk factors otherwise for Pseudomonas. Overall, this was consistent with venous stasis edema and a mild superimposed cellulitis. She was treated initially with vancomycin for 2 days and then topical mupirocin added. Given the superficial culture showing Pseudomonas, started [name] [name], [name] [name] ([id number]) [id number].pdf Page 6 of 8 on IV ciprofloxacin as well as oral cephalexin for MSSA for treatment of her cellulitis. Vancomycin was discontinued. Transitioned from IV to oral ciprofloxacin on the day of discharge, and continued both ciprofloxacin and cephalexin for a total 7-day course on discharge. Blood cultures negative. Pain was managed with Tylenol and oxycodone as needed, which was also prescribed short term on discharge. Dressing changes performed frequently throughout the day due to significant weeping of her wound, with continuation of dressing changes including Silvadene on discharge. Patient has an appointment with the wound care clinic on November 11 at 1:45 PM Prediabetes (A1c 6.4%). Started on metformin 500mg  XL outpatient. Admission to wound care center [place], [place]: 06-07-2023 upon evaluation today patient appears to be doing somewhat poorly in regard to her right leg. She tells me that she has been having some issues here with infection. She does tell me that overall the infection has been treated in the hospital initially she was noted to have Pseudomonas and some mild staph. Subsequently she was put on Cipro and Keflex. With that being said she still has quite a bit of drainage as well as erythema she is nearing the completion of her antibiotics they gave oral they were given a  total of 7 days of each the Keflex and the Cipro I do not think 7 days is gena be enough I discussed that with her today. With that being said I do believe after reviewing the notes above from [facility] but the patient is on the right track I just think we need to Cipro for a bit longer. Patient does have a history of congestive heart failure, nicotine dependence, COPD, prediabetes, and again chronic venous hypertension. 06-14-2023 upon evaluation today patient appears to be doing well currently in regard to her wound. She has been tolerating the dressing changes without complication. Fortunately I do not see any signs of active infection at this time which is great news. No fevers, chills, nausea, vomiting, or diarrhea. Upon inspection patient's wound bed actually showed signs of good granulation and epithelization at this point. I actually did perform some debridement clearway necrotic debris today she tolerated that debridement without complication and postdebridement the wound bed is significantly improved. 06-21-2023 upon evaluation today patient's wound is  actually showing signs of significant improvement. This is really good news. Fortunately I do not see any signs of infection and overall I believe that we are making really good headway here towards closure which is great news 06-28-2023 upon evaluation today patient appears to be doing well currently in regard to her wound which is actually showing some signs of improvement this has come along way. With that being said it does have some new skin around the edge she did not have any of the XtraSorb to put on we have tried to get her some of the dressing supplies we had here but unfortunately they did not get a message in time. 07-07-2023 upon evaluation today patient appears to be doing well currently in regard to her wound. She has been tolerating the dressing changes without complication. This is slowly getting smaller and better. With  that being said her legs are red but I am not sure this is actually due to cellulitis I am going to perform a little bit of debridement clearway necrotic debris on the 19th a culture as well and we will see what the results of the culture show depending on this finding we will see whether or not we need to initiate any antibiotic therapy. She is actually in agreement with this plan. 07-14-2023 upon evaluation today patient appears to be doing well currently in regard to her wound. She has been tolerating the dressing changes without complication. Fortunately I think her making good progress she does have a bacterial infection noted I did send in the antibiotic for her she is can be picking that up today that is Augmentin. She has not gotten it filled as of yet. Objective Constitutional Well-nourished and well-hydrated in no acute distress. Vitals Time Taken: 2:49 PM, Height: 61 in, Weight: 330 lbs, BMI: 62.3, Temperature: 98.3 F, Pulse: 80 bpm, Respiratory Rate: 18 breaths/min, Blood Pressure: 168/91 mmHg. Respiratory normal breathing without difficulty. Psychiatric this patient is able to make decisions and demonstrates good insight into disease process. Alert and Oriented x 3. pleasant and cooperative. General Notes: Upon inspection patient's wound bed actually showed signs of good granulation epithelization at this point. Fortunately I do not see any signs of active infection currently and I do believe that she is making really good headway here towards closure. Integumentary (Hair, Skin) Wound #1 status is Open. Original cause of wound was Gradually Appeared. The date acquired was: 04/06/2023. The wound has been in treatment 5 weeks. The wound is located on the Right,Lateral Lower Leg. The wound measures 3.7cm length x 3cm width x 0.1cm depth; 8.718cm^2 area and 0.872cm^3 volume. There is Fat Layer (Subcutaneous Tissue) exposed. There is a medium amount of serosanguineous drainage noted. There  is medium (34-66%) red, pink granulation within the wound bed. There is a medium (34-66%) amount of necrotic tissue within the wound bed including Adherent Slough. Assessment Active Problems ICD-10 Chronic venous hypertension (idiopathic) with ulcer and inflammation of right lower extremity Cellulitis of right lower limb Non-pressure chronic ulcer of other part of right lower leg with fat layer exposed Prediabetes Other specified chronic obstructive pulmonary disease Nicotine dependence, cigarettes, with other nicotine-induced disorders Chronic combined systolic (congestive) and diastolic (congestive) heart failure [name], [name] ([id number]) [id number] Page 7 of 8 Procedures Wound #1 Pre-procedure diagnosis of Wound #1 is an Atypical located on the Right,Lateral Lower Leg . There was a Selective/Open Wound Skin/Epidermis Debridement with a total area of 8.71 sq cm performed by Allen Derry, PA-C. With the following  instrument(s): Curette to remove Viable and Non-Viable tissue/material. Material removed includes Slough, Skin: Dermis, and Skin: Epidermis. A time out was conducted at 15:11, prior to the start of the procedure. A Minimum amount of bleeding was controlled with Pressure. The procedure was tolerated well. Post Debridement Measurements: 3.7cm length x 3cm width x 0.1cm depth; 0.872cm^3 volume. Character of Wound/Ulcer Post Debridement is stable. Post procedure Diagnosis Wound #1: Same as Pre-Procedure Plan Follow-up Appointments: Return Appointment in 1 week. Nurse Visit as needed Edema Control - Orders / Instructions: Elevate, Exercise Daily and Avoid Standing for Long Periods of Time. Elevate legs to the level of the heart and pump ankles as often as possible Elevate leg(s) parallel to the floor when sitting. WOUND #1: - Lower Leg Wound Laterality: Right, Lateral Cleanser: Soap and Water 2 x Per Week/30 Days Discharge Instructions: Gently cleanse  wound with antibacterial soap, rinse and pat dry prior to dressing wounds Cleanser: Vashe 5.8 (oz) 2 x Per Week/30 Days Discharge Instructions: Use vashe 5.8 (oz) as directed Prim Dressing: Hydrofera Blue Ready Transfer Foam, 2.5x2.5 (in/in) 2 x Per Week/30 Days ary Discharge Instructions: Apply Hydrofera Blue Ready to wound bed as directed Secondary Dressing: Zetuvit Plus 4x4 (in/in) 2 x Per Week/30 Days Secured With: Tubigrip Size D, 3x10 (in/yd) 2 x Per Week/30 Days Discharge Instructions: double layer 1. I would recommend that we have the patient continue to monitor for any signs of infection or worsening. Based on what I see I do believe that we are making headway here towards closure. I do believe the [facility] is probably still the best way to go at this point. 2. I am going to recommend as well patient should continue with the Tubigrip size D which I think is doing well for her currently. 3. I am going to recommend she should take the antibiotic that is the Augmentin that I sent in she is in a good get that picked up when she leaves here today. We will see patient back for reevaluation in 1 week here in the clinic. If anything worsens or changes patient will contact our office for additional recommendations. Electronic Signature(s) Signed: 07/14/2023 3:26:15 PM By: Allen Derry PA-C Entered By: Allen Derry on 07/14/2023 12:26:15 -------------------------------------------------------------------------------- SuperBill Details Patient Name: Date of Service: [name] [name] [name] 07/14/2023 Medical Record Number: [id number] Patient Account Number: [id number] Date of Birth/Sex: Treating RN: 1971-01-16 (52 y.o. [name] Primary Care Provider: Unknown Foley Other Clinician: Betha Loa Referring Provider: Treating Provider/Extender: Jacklyn Shell Weeks in Treatment: 5 Diagnosis Coding [name], [name] [name] ([id number]) [id number] Page 8 of  8 ICD-10 Codes Code Description [phone] Chronic venous hypertension (idiopathic) with ulcer and inflammation of right lower extremity L03.115 Cellulitis of right lower limb L97.812 Non-pressure chronic ulcer of other part of right lower leg with fat layer exposed R73.03 Prediabetes J44.89 Other specified chronic obstructive pulmonary disease F17.218 Nicotine dependence, cigarettes, with other nicotine-induced disorders I50.42 Chronic combined systolic (congestive) and diastolic (congestive) heart failure Facility Procedures : CPT4 Code: [id number] Description: 28413 - DEBRIDE WOUND 1ST 20 SQ CM OR < ICD-10 Diagnosis Description L97.812 Non-pressure chronic ulcer of other part of right lower leg with fat layer expos Modifier: ed Quantity: 1 Physician Procedures : CPT4 Code Description Modifier [id number] - WC PHYS DEBR WO ANESTH 20 SQ CM ICD-10 Diagnosis Description L97.812 Non-pressure chronic ulcer of other part of right lower leg with fat layer exposed Quantity: 1 Electronic Signature(s) Signed: 07/14/2023 3:30:25 PM By: Allen Derry PA-C Entered  By: Allen Derry on 07/14/2023 12:30:24

## 2023-07-17 NOTE — Progress Notes (Signed)
DEYONCE, FREDERIKSEN ([id number]) [id number] Page 1 of 9 Visit Report for 07/14/2023 Arrival Information Details Patient Name: Date of Service: [name], [name] 07/14/2023 2:45 PM Medical Record Number: [id number] Patient Account Number: [id number] Date of Birth/Sex: Treating RN: 1971-07-11 (52 y.o. [name] Primary Care [name]: [name] Other Clinician: Betha Loa Referring Yomar Mejorado: Treating [name]/Extender: [name]: 5 Visit Information History Since Last Visit All ordered tests and consults were completed: No Patient Arrived: Ambulatory Added or deleted any medications: No Arrival Time: 14:48 Any new allergies or adverse reactions: No Transfer Assistance: None Had a fall or experienced change in No Patient Identification Verified: Yes activities of daily living that may affect Secondary Verification Process Completed: Yes risk of falls: Patient Requires Transmission-Based Precautions: No Signs or symptoms of abuse/neglect since last visito No Patient Has Alerts: Yes Hospitalized since last visit: No Patient Alerts: Prediabetic Implantable device outside of the clinic excluding No cellular tissue based products placed in the center since last visit: Has Dressing in Place as Prescribed: Yes Has Compression in Place as Prescribed: Yes Pain Present Now: Yes Electronic Signature(s) Signed: 07/16/2023 12:10:58 PM By: Betha Loa Entered By: Betha Loa on 07/14/2023 11:49:07 -------------------------------------------------------------------------------- Clinic Level of Care Assessment Details Patient Name: Date of Service: [name], [name] 07/14/2023 2:45 PM Medical Record Number: [id number] Patient Account Number: [id number] Date of Birth/Sex: Treating RN: 25-Jul-1971 (52 y.o. [name] Primary Care Faizon Capozzi: [name] Other Clinician: Betha Loa Referring [name]: Treating  [name]/Extender: [name]: 5 Clinic Level of Care Assessment Items TOOL 1 Quantity Score []  - 0 Use when EandM and Procedure is performed on INITIAL visit ASSESSMENTS - Nursing Assessment / Reassessment []  - 0 General Physical Exam (combine w/ comprehensive assessment (listed just below) when performed on new pt. evalsMabelene [name], [name] ([id number]) [id number] Page 2 of 9 []  - 0 Comprehensive Assessment (HX, ROS, Risk Assessments, Wounds Hx, etc.) ASSESSMENTS - Wound and Skin Assessment / Reassessment []  - 0 Dermatologic / Skin Assessment (not related to wound area) ASSESSMENTS - Ostomy and/or Continence Assessment and Care []  - 0 Incontinence Assessment and Management []  - 0 Ostomy Care Assessment and Management (repouching, etc.) PROCESS - Coordination of Care []  - 0 Simple Patient / Family Education for ongoing care []  - 0 Complex (extensive) Patient / Family Education for ongoing care []  - 0 Staff obtains Chiropractor, Records, T Results / Process Orders est []  - 0 Staff telephones HHA, Nursing Homes / Clarify orders / etc []  - 0 Routine Transfer to another Facility (non-emergent condition) []  - 0 Routine Hospital Admission (non-emergent condition) []  - 0 New Admissions / Manufacturing engineer / Ordering NPWT Apligraf, etc. , []  - 0 Emergency Hospital Admission (emergent condition) PROCESS - Special Needs []  - 0 Pediatric / Minor Patient Management []  - 0 Isolation Patient Management []  - 0 Hearing / Language / Visual special needs []  - 0 Assessment of Community assistance (transportation, D/C planning, etc.) []  - 0 Additional assistance / Altered mentation []  - 0 Support Surface(s) Assessment (bed, cushion, seat, etc.) INTERVENTIONS - Miscellaneous []  - 0 External ear exam []  - 0 Patient Transfer (multiple staff / Nurse, adult / Similar devices) []  - 0 Simple Staple / Suture removal (25 or  less) []  - 0 Complex Staple / Suture removal (26 or more) []  - 0 Hypo/Hyperglycemic Management (do not check if billed separately) []  - 0 Ankle / Brachial Index (ABI) - do not check if  billed separately Has the patient been seen at the hospital within the last three years: Yes Total Score: 0 Level Of Care: ____ Electronic Signature(s) Signed: 07/16/2023 12:10:58 PM By: Betha Loa Entered By: Betha Loa on 07/14/2023 12:14:07 -------------------------------------------------------------------------------- Encounter Discharge Information Details Patient Name: Date of Service: [name], [name] 07/14/2023 2:45 PM Medical Record Number: [id number] Patient Account Number: [id number] Date of Birth/Sex: Treating RN: Aug 12, 1970 (52 y.o. [name] Primary Care Toriano Aikey: [name] Other Clinician: Betha Loa [name], [name] ([id number]) [id number] Page 3 of 9 Referring Kerilyn Cortner: Treating Derrien Anschutz/Extender: [name] in [name]: 5 Encounter Discharge Information Items Post Procedure Vitals Discharge Condition: Stable Temperature (F): 98.3 Ambulatory Status: Ambulatory Pulse (bpm): 80 Discharge Destination: Home Respiratory Rate (breaths/min): 18 Transportation: Private Auto Blood Pressure (mmHg): 168/91 Accompanied By: sistet Schedule Follow-up Appointment: Yes Clinical Summary of Care: Electronic Signature(s) Signed: 07/16/2023 12:10:58 PM By: Betha Loa Entered By: Betha Loa on 07/14/2023 12:26:20 -------------------------------------------------------------------------------- Lower Extremity Assessment Details Patient Name: Date of Service: [name], [name] 07/14/2023 2:45 PM Medical Record Number: [id number] Patient Account Number: [id number] Date of Birth/Sex: Treating RN: 07/17/1971 (52 y.o. [name] Primary Care Itzae Miralles: [name] Other Clinician: Betha Loa Referring  Elic Vencill: Treating Moet Mikulski/Extender: [name]: 5 Edema Assessment Assessed: [Left: No] [Right: Yes] Edema: [Left: Ye] [Right: s] Calf Left: Right: Point of Measurement: 33 cm From Medial Instep 46 cm Ankle Left: Right: Point of Measurement: 12 cm From Medial Instep 25 cm Vascular Assessment Pulses: Dorsalis Pedis Palpable: [Right:Yes] Extremity colors, hair growth, and conditions: Extremity Color: [Right:Red] Hair Growth on Extremity: [Right:Yes] Temperature of Extremity: [Right:Warm < 3 seconds] Toe Nail Assessment Left: Right: Thick: No Discolored: No Deformed: No Improper Length and Hygiene: No Electronic Signature(s) Signed: 07/16/2023 11:21:11 AM By: Yevonne Pax RN Williams Che, Estell ([id number]) [id number] Page 4 of 9 Signed: 07/16/2023 12:10:58 PM By: Betha Loa Entered By: Betha Loa on 07/14/2023 12:00:16 -------------------------------------------------------------------------------- Multi Wound Chart Details Patient Name: Date of Service: [name], [name] 07/14/2023 2:45 PM Medical Record Number: [id number] Patient Account Number: [id number] Date of Birth/Sex: Treating RN: 1970-11-27 (52 y.o. [name] Primary Care Ingram Onnen: [name] Other Clinician: Betha Loa Referring Rocsi Hazelbaker: Treating Lasundra Hascall/Extender: [name]: 5 Vital Signs Height(in): 61 Pulse(bpm): 80 Weight(lbs): 330 Blood Pressure(mmHg): 168/91 Body Mass Index(BMI): 62.3 Temperature(F): 98.3 Respiratory Rate(breaths/min): 18 [1:Photos:] [N/A:N/A] Right, Lateral Lower Leg N/A N/A Wound Location: Gradually Appeared N/A N/A Wounding Event: Atypical N/A N/A Primary Etiology: Chronic Obstructive Pulmonary N/A N/A Comorbid History: Disease (COPD), Sleep Apnea, Congestive Heart Failure, Neuropathy 04/06/2023 N/A N/A Date Acquired: 5 N/A N/A Weeks of [name]: Open  N/A N/A Wound Status: No N/A N/A Wound Recurrence: 3.7x3x0.1 N/A N/A Measurements L x W x D (cm) 8.718 N/A N/A A (cm) : rea 0.872 N/A N/A Volume (cm) : 72.20% N/A N/A % Reduction in Area: 72.20% N/A N/A % Reduction in Volume: Full Thickness Without Exposed N/A N/A Classification: Support Structures Medium N/A N/A Exudate Amount: Serosanguineous N/A N/A Exudate Type: red, brown N/A N/A Exudate Color: Medium (34-66%) N/A N/A Granulation Amount: Red, Pink N/A N/A Granulation Quality: Medium (34-66%) N/A N/A Necrotic Amount: Fat Layer (Subcutaneous Tissue): Yes N/A N/A Exposed Structures: Fascia: No Tendon: No Muscle: No Joint: No Bone: No Small (1-33%) N/A N/A Epithelialization: [name] Notes Klug, Beaulah ([id number]) [id number] Page 5 of 9 Electronic Signature(s) Signed: 07/16/2023 12:10:58 PM By: Betha Loa Entered By: Betha Loa on 07/14/2023 12:00:22 -------------------------------------------------------------------------------- Multi-Disciplinary  Care Plan Details Patient Name: Date of Service: [name], [name] 07/14/2023 2:45 PM Medical Record Number: [id number] Patient Account Number: [id number] Date of Birth/Sex: Treating RN: Jul 18, 1971 (52 y.o. [name] Primary Care Maty Zeisler: [name] Other Clinician: Betha Loa Referring Jarrah Babich: Treating Akaylah Lalley/Extender: [name]: 5 Active Inactive Wound/Skin Impairment Nursing Diagnoses: Impaired tissue integrity Knowledge deficit related to smoking impact on wound healing Knowledge deficit related to ulceration/compromised skin integrity Goals: Patient will demonstrate a reduced rate of smoking or cessation of smoking Date Initiated: 06/07/2023 Date Inactivated: 07/07/2023 Target Resolution Date: 07/08/2023 Goal Status: Met Patient/caregiver will verbalize understanding of skin care regimen Date Initiated:  06/07/2023 Date Inactivated: 07/07/2023 Target Resolution Date: 07/07/2023 Goal Status: Met Ulcer/skin breakdown will have a volume reduction of 30% by week 4 Date Initiated: 06/07/2023 Date Inactivated: 07/07/2023 Target Resolution Date: 07/07/2023 Goal Status: Met Ulcer/skin breakdown will have a volume reduction of 50% by week 8 Date Initiated: 06/07/2023 Target Resolution Date: 08/07/2023 Goal Status: Active Ulcer/skin breakdown will have a volume reduction of 80% by week 12 Date Initiated: 06/07/2023 Target Resolution Date: 09/07/2023 Goal Status: Active Interventions: Assess patient/caregiver ability to obtain necessary supplies Assess patient/caregiver ability to perform ulcer/skin care regimen upon admission and as needed Assess ulceration(s) every visit Provide education on smoking Provide education on ulcer and skin care [name] Activities: Skin care regimen initiated : 06/07/2023 Notes: Electronic Signature(s) Signed: 07/16/2023 11:21:11 AM By: Yevonne Pax RN Signed: 07/16/2023 12:10:58 PM By: Betha Loa Entered By: Betha Loa on 07/14/2023 12:16:10 [name] Gerald ([id number]) [id number] Page 6 of 9 -------------------------------------------------------------------------------- Pain Assessment Details Patient Name: Date of Service: [name], [name] 07/14/2023 2:45 PM Medical Record Number: [id number] Patient Account Number: [id number] Date of Birth/Sex: Treating RN: [birth year] (52 y.o. [name] Primary Care Dmonte Maher: [name] Other Clinician: Betha Loa Referring Eunice Oldaker: Treating Martina Brodbeck/Extender: [name]: 5 Active Problems Location of Pain Severity and Description of Pain Patient Has Paino Yes Site Locations Pain Location: Pain in Ulcers Duration of the Pain. Constant / Intermittento Constant Rate the pain. Current Pain Level: 5 Character of Pain Describe the  Pain: Burning, Other: stinging Pain Management and Medication Current Pain Management: Medication: No Cold Application: No Rest: No Massage: No Activity: No T.E.N.S.: No Heat Application: No Leg drop or elevation: No Is the Current Pain Management Adequate: Inadequate How does your wound impact your activities of daily livingo Sleep: No Bathing: No Appetite: No Relationship With Others: No Bladder Continence: No Emotions: No Bowel Continence: No Work: No Toileting: No Drive: No Dressing: No Hobbies: No Electronic Signature(s) Signed: 07/16/2023 11:21:11 AM By: Yevonne Pax RN Signed: 07/16/2023 12:10:58 PM By: Betha Loa Entered By: Betha Loa on 07/14/2023 11:52:01 Weatherwax, [name] ([id number]) [id number] Page 7 of 9 -------------------------------------------------------------------------------- Patient/Caregiver Education Details Patient Name: Date of Service: [name], [name] 12/18/2024andnbsp2:45 PM Medical Record Number: [id number] Patient Account Number: [id number] Date of Birth/Gender: Treating RN: 1971/06/29 (52 y.o. [name] Primary Care Physician: [name] Other Clinician: Betha Loa Referring Physician: Treating Physician/Extender: [name] in [name]: 5 Education Assessment Education Provided To: Patient Education Topics Provided Wound/Skin Impairment: Handouts: Other: continue wound care as directed Methods: Explain/Verbal Responses: State content correctly Electronic Signature(s) Signed: 07/16/2023 12:10:58 PM By: Betha Loa Entered By: Betha Loa on 07/14/2023 12:25:28 -------------------------------------------------------------------------------- Wound Assessment Details Patient Name: Date of Service: [name], [name] 07/14/2023 2:45 PM Medical Record Number: [id number] Patient Account Number: [id number] Date of Birth/Sex: Treating  RN: Apr 08, 1971 (52 y.o. [name] Primary Care Alberto Schoch: [name] Other Clinician: Betha Loa Referring Chancy Claros: Treating Harini Dearmond/Extender: [name]: 5 Wound Status Wound Number: 1 Primary Atypical Etiology: Wound Location: Right, Lateral Lower Leg Wound Open Wounding Event: Gradually Appeared Status: Date Acquired: 04/06/2023 Comorbid Chronic Obstructive Pulmonary Disease (COPD), Sleep Apnea, Weeks Of [name]: 5 History: Congestive Heart Failure, Neuropathy Clustered Wound: No Photos Curiale, [name] ([id number]) [id number] Page 8 of 9 Wound Measurements Length: (cm) 3.7 Width: (cm) 3 Depth: (cm) 0.1 Area: (cm) 8.718 Volume: (cm) 0.872 % Reduction in Area: 72.2% % Reduction in Volume: 72.2% Epithelialization: Small (1-33%) Wound Description Classification: Full Thickness Without Exposed Support Structures Exudate Amount: Medium Exudate Type: Serosanguineous Exudate Color: red, brown Foul Odor After Cleansing: No Slough/Fibrino Yes Wound Bed Granulation Amount: Medium (34-66%) Exposed Structure Granulation Quality: Red, Pink Fascia Exposed: No Necrotic Amount: Medium (34-66%) Fat Layer (Subcutaneous Tissue) Exposed: Yes Necrotic Quality: Adherent Slough Tendon Exposed: No Muscle Exposed: No Joint Exposed: No Bone Exposed: No [name] Notes Wound #1 (Lower Leg) Wound Laterality: Right, Lateral Cleanser Soap and Water Discharge Instruction: Gently cleanse wound with antibacterial soap, rinse and pat dry prior to dressing wounds Vashe 5.8 (oz) Discharge Instruction: Use vashe 5.8 (oz) as directed Peri-Wound Care Topical Primary Dressing Hydrofera Blue Ready Transfer Foam, 2.5x2.5 (in/in) Discharge Instruction: Apply Hydrofera Blue Ready to wound bed as directed Secondary Dressing Zetuvit Plus 4x4 (in/in) Secured With Tubigrip Size D, 3x10 (in/yd) Discharge Instruction: double layer Compression  Wrap Compression Stockings Add-Ons Electronic Signature(s) Signed: 07/16/2023 11:21:11 AM By: Yevonne Pax RN Signed: 07/16/2023 12:10:58 PM By: Betha Loa Entered By: Betha Loa on 07/14/2023 11:59:05 Petrelli, [name] ([id number]) [id number] Page 9 of 9 -------------------------------------------------------------------------------- Vitals Details Patient Name: Date of Service: [name], [name] 07/14/2023 2:45 PM Medical Record Number: [id number] Patient Account Number: [id number] Date of Birth/Sex: Treating RN: [birth year] (52 y.o. [name] Primary Care Nimrat Woolworth: [name] Other Clinician: Betha Loa Referring Haya Hemler: Treating Chirag Krueger/Extender: [name]: 5 Vital Signs Time Taken: 14:49 Temperature (F): 98.3 Height (in): 61 Pulse (bpm): 80 Weight (lbs): 330 Respiratory Rate (breaths/min): 18 Body Mass Index (BMI): 62.3 Blood Pressure (mmHg): 168/91 Reference Range: 80 - 120 mg / dl Electronic Signature(s) Signed: 07/16/2023 12:10:58 PM By: Betha Loa Entered By: Betha Loa on 07/14/2023 11:51:56

## 2023-07-23 ENCOUNTER — Encounter: Payer: 59 | Admitting: Internal Medicine

## 2023-07-23 DIAGNOSIS — I87331 Chronic venous hypertension (idiopathic) with ulcer and inflammation of right lower extremity: Secondary | ICD-10-CM | POA: Diagnosis not present

## 2023-07-23 NOTE — Progress Notes (Signed)
AREANNA, STONIER ([id number]) [id number] Page 1 of 9 Visit Report for 07/23/2023 Arrival Information Details Patient Name: Date of Service: [name], [name] 07/23/2023 11:30 A M Medical Record Number: [id number] Patient Account Number: [id number] Date of Birth/Sex: Treating RN: 1971/07/19 (52 y.o. [name] Primary Care Jarrod Mcenery: Unknown Foley Other Clinician: Referring Irini Leet: Treating Juanjose Mojica/Extender: RO BSO N, MICHA EL Darlen Round, Robin Weeks in Treatment: 6 Visit Information History Since Last Visit Added or deleted any medications: No Patient Arrived: Ambulatory Any new allergies or adverse reactions: No Arrival Time: 11:36 Had a fall or experienced change in No Accompanied By: family activities of daily living that may affect Transfer Assistance: None risk of falls: Patient Identification Verified: Yes Hospitalized since last visit: No Secondary Verification Process Completed: Yes Has Dressing in Place as Prescribed: Yes Patient Requires Transmission-Based Precautions: No Has Compression in Place as Prescribed: Yes Patient Has Alerts: Yes Pain Present Now: No Patient Alerts: Prediabetic Electronic Signature(s) Signed: 07/23/2023 12:38:04 PM By: Angelina Pih Entered By: Angelina Pih on 07/23/2023 08:37:43 -------------------------------------------------------------------------------- Clinic Level of Care Assessment Details Patient Name: Date of Service: [name], [name] 07/23/2023 11:30 A M Medical Record Number: [id number] Patient Account Number: [id number] Date of Birth/Sex: Treating RN: 12-27-1970 (52 y.o. [name] Primary Care Kalab Camps: Unknown Foley Other Clinician: Referring Benjimin Hadden: Treating Franchon Ketterman/Extender: RO BSO N, MICHA EL Darlen Round, Robin Weeks in Treatment: 6 Clinic Level of Care Assessment Items TOOL 1 Quantity Score []  - 0 Use when EandM and Procedure is performed on INITIAL visit ASSESSMENTS -  Nursing Assessment / Reassessment []  - 0 General Physical Exam (combine w/ comprehensive assessment (listed just below) when performed on new pt. evals) []  - 0 Comprehensive Assessment (HX, ROS, Risk Assessments, Wounds Hx, etc.) ASSESSMENTS - Wound and Skin Assessment / Reassessment []  - 0 Dermatologic / Skin Assessment (not related to wound area) [name], [name] ([id number]) [id number] Page 2 of 9 ASSESSMENTS - Ostomy and/or Continence Assessment and Care []  - 0 Incontinence Assessment and Management []  - 0 Ostomy Care Assessment and Management (repouching, etc.) PROCESS - Coordination of Care []  - 0 Simple Patient / Family Education for ongoing care []  - 0 Complex (extensive) Patient / Family Education for ongoing care []  - 0 Staff obtains Chiropractor, Records, T Results / Process Orders est []  - 0 Staff telephones HHA, Nursing Homes / Clarify orders / etc []  - 0 Routine Transfer to another Facility (non-emergent condition) []  - 0 Routine Hospital Admission (non-emergent condition) []  - 0 New Admissions / Manufacturing engineer / Ordering NPWT Apligraf, etc. , []  - 0 Emergency Hospital Admission (emergent condition) PROCESS - Special Needs []  - 0 Pediatric / Minor Patient Management []  - 0 Isolation Patient Management []  - 0 Hearing / Language / Visual special needs []  - 0 Assessment of Community assistance (transportation, D/C planning, etc.) []  - 0 Additional assistance / Altered mentation []  - 0 Support Surface(s) Assessment (bed, cushion, seat, etc.) INTERVENTIONS - Miscellaneous []  - 0 External ear exam []  - 0 Patient Transfer (multiple staff / Nurse, adult / Similar devices) []  - 0 Simple Staple / Suture removal (25 or less) []  - 0 Complex Staple / Suture removal (26 or more) []  - 0 Hypo/Hyperglycemic Management (do not check if billed separately) []  - 0 Ankle / Brachial Index (ABI) - do not check if billed separately Has the  patient been seen at the hospital within the last three years: Yes Total Score: 0 Level Of Care: ____ Electronic Signature(s) Signed: 07/23/2023  12:38:04 PM By: Angelina Pih Entered By: Angelina Pih on 07/23/2023 09:11:34 -------------------------------------------------------------------------------- Compression Therapy Details Patient Name: Date of Service: [name], [name] 07/23/2023 11:30 A M Medical Record Number: [id number] Patient Account Number: [id number] Date of Birth/Sex: Treating RN: 01/06/1971 (52 y.o. [name] Primary Care Yehonatan Grandison: Unknown Foley Other Clinician: Referring Jeanee Fabre: Treating Audrick Lamoureaux/Extender: RO BSO N, MICHA EL Darlen Round, Robin Weeks in Treatment: 6 Compression Therapy Performed for Wound Assessment: Wound #1 Right,Lateral Lower Leg Performed By: Holly Bodily, RN Compression TypeOsborn Coho, [name] ([id number]) [id number] Page 3 of 9 Post Procedure Diagnosis Same as Pre-procedure Electronic Signature(s) Signed: 07/23/2023 12:11:50 PM By: Angelina Pih Entered By: Angelina Pih on 07/23/2023 09:11:50 -------------------------------------------------------------------------------- Encounter Discharge Information Details Patient Name: Date of Service: [name], [name] 07/23/2023 11:30 A M Medical Record Number: [id number] Patient Account Number: [id number] Date of Birth/Sex: Treating RN: 09-26-1970 (52 y.o. [name] Primary Care Chivonne Rascon: Unknown Foley Other Clinician: Referring Maliyah Willets: Treating Birdell Frasier/Extender: RO BSO N, MICHA EL Darlen Round, Alean Rinne in Treatment: 6 Encounter Discharge Information Items Discharge Condition: Stable Ambulatory Status: Ambulatory Discharge Destination: Home Transportation: Private Auto Accompanied By: family Schedule Follow-up Appointment: Yes Clinical Summary of Care: Electronic Signature(s) Signed: 07/23/2023 12:12:40 PM By:  Angelina Pih Entered By: Angelina Pih on 07/23/2023 09:12:40 -------------------------------------------------------------------------------- Lower Extremity Assessment Details Patient Name: Date of Service: [name], [name] 07/23/2023 11:30 A M Medical Record Number: [id number] Patient Account Number: [id number] Date of Birth/Sex: Treating RN: Mar 02, 1971 (52 y.o. [name] Primary Care Doral Digangi: Unknown Foley Other Clinician: Referring Madelaine Whipple: Treating Brinn Westby/Extender: RO BSO N, MICHA EL Darlen Round, Robin Weeks in Treatment: 6 Edema Assessment Assessed: [Left: No] [Right: No] Edema: [Left: Ye] [Right: s] Calf Caradine, Tylisa ([id number]) [id number] Page 4 of 9 Left: Right: Point of Measurement: 33 cm From Medial Instep 52.5 cm Ankle Left: Right: Point of Measurement: 12 cm From Medial Instep 26 cm Vascular Assessment Pulses: Dorsalis Pedis Palpable: [Right:Yes] Extremity colors, hair growth, and conditions: Extremity Color: [Right:Red] Hair Growth on Extremity: [Right:Yes] Temperature of Extremity: [Right:Warm < 3 seconds] Toe Nail Assessment Left: Right: Thick: Yes Discolored: No Deformed: No Improper Length and Hygiene: No Electronic Signature(s) Signed: 07/23/2023 12:38:04 PM By: Angelina Pih Entered By: Angelina Pih on 07/23/2023 08:45:22 -------------------------------------------------------------------------------- Multi Wound Chart Details Patient Name: Date of Service: [name], [name] 07/23/2023 11:30 A M Medical Record Number: [id number] Patient Account Number: [id number] Date of Birth/Sex: Treating RN: 1970/12/26 (52 y.o. [name] Primary Care Gioia Ranes: Unknown Foley Other Clinician: Referring Jrue Jarriel: Treating Lewayne Pauley/Extender: RO BSO N, MICHA EL Darlen Round, Robin Weeks in Treatment: 6 Vital Signs Height(in): 61 Pulse(bpm): 90 Weight(lbs): 330 Blood Pressure(mmHg): 167/76 Body Mass  Index(BMI): 62.3 Temperature(F): 97.6 Respiratory Rate(breaths/min): 18 [1:Photos:] [N/A:N/A] Right, Lateral Lower Leg N/A N/A Wound Location: Gradually Appeared N/A N/A Wounding Event: Atypical N/A N/A Primary EtiologyAMAR, GROSHONG ([id number]) [id number] Page 5 of 9 Chronic Obstructive Pulmonary N/A N/A Comorbid History: Disease (COPD), Sleep Apnea, Congestive Heart Failure, Neuropathy 04/06/2023 N/A N/A Date Acquired: 6 N/A N/A Weeks of Treatment: Open N/A N/A Wound Status: No N/A N/A Wound Recurrence: 1.5x2.5x0.1 N/A N/A Measurements L x W x D (cm) 2.945 N/A N/A A (cm) : rea 0.295 N/A N/A Volume (cm) : 90.60% N/A N/A % Reduction in Area: 90.60% N/A N/A % Reduction in Volume: Full Thickness Without Exposed N/A N/A Classification: Support Structures Medium N/A N/A Exudate Amount: Serosanguineous N/A N/A Exudate Type: red, brown N/A N/A Exudate Color: Small (1-33%) N/A N/A  Granulation Amount: Red, Pink N/A N/A Granulation Quality: Large (67-100%) N/A N/A Necrotic Amount: Fat Layer (Subcutaneous Tissue): Yes N/A N/A Exposed Structures: Fascia: No Tendon: No Muscle: No Joint: No Bone: No Small (1-33%) N/A N/A Epithelialization: Treatment Notes Electronic Signature(s) Signed: 07/23/2023 12:09:49 PM By: Angelina Pih Entered By: Angelina Pih on 07/23/2023 09:09:49 -------------------------------------------------------------------------------- Multi-Disciplinary Care Plan Details Patient Name: Date of Service: [name], [name] 07/23/2023 11:30 A M Medical Record Number: [id number] Patient Account Number: [id number] Date of Birth/Sex: Treating RN: 10/03/1970 (52 y.o. [name] Primary Care Dequavius Kuhner: Unknown Foley Other Clinician: Referring Alexi Geibel: Treating Jermel Artley/Extender: RO BSO N, MICHA EL Darlen Round, Robin Weeks in Treatment: 6 Active Inactive Wound/Skin Impairment Nursing Diagnoses: Impaired tissue  integrity Knowledge deficit related to smoking impact on wound healing Knowledge deficit related to ulceration/compromised skin integrity Goals: Patient will demonstrate a reduced rate of smoking or cessation of smoking Date Initiated: 06/07/2023 Date Inactivated: 07/07/2023 Target Resolution Date: 07/08/2023 Goal Status: Met Patient/caregiver will verbalize understanding of skin care regimen Date Initiated: 06/07/2023 Date Inactivated: 07/07/2023 Target Resolution Date: 07/07/2023 Goal Status: Met Ulcer/skin breakdown will have a volume reduction of 30% by week 4 Date Initiated: 06/07/2023 Date Inactivated: 07/07/2023 Target Resolution Date: 07/07/2023 Goal Status: Met [name], [name] ([id number]) [id number] Page 6 of 9 Ulcer/skin breakdown will have a volume reduction of 50% by week 8 Date Initiated: 06/07/2023 Target Resolution Date: 08/07/2023 Goal Status: Active Ulcer/skin breakdown will have a volume reduction of 80% by week 12 Date Initiated: 06/07/2023 Target Resolution Date: 09/07/2023 Goal Status: Active Interventions: Assess patient/caregiver ability to obtain necessary supplies Assess patient/caregiver ability to perform ulcer/skin care regimen upon admission and as needed Assess ulceration(s) every visit Provide education on smoking Provide education on ulcer and skin care Treatment Activities: Skin care regimen initiated : 06/07/2023 Notes: Electronic Signature(s) Signed: 07/23/2023 12:11:59 PM By: Angelina Pih Entered By: Angelina Pih on 07/23/2023 09:11:59 -------------------------------------------------------------------------------- Pain Assessment Details Patient Name: Date of Service: [name], [name] 07/23/2023 11:30 A M Medical Record Number: [id number] Patient Account Number: [id number] Date of Birth/Sex: Treating RN: 08-19-1970 (52 y.o. [name] Primary Care Evens Meno: Unknown Foley Other Clinician: Referring  Lakeita Panther: Treating Felix Pratt/Extender: RO BSO N, MICHA EL Darlen Round, Robin Weeks in Treatment: 6 Active Problems Location of Pain Severity and Description of Pain Patient Has Paino No Site Locations Pain Management and Medication Current Pain Management: Electronic Signature(s) Groesbeck, Nechama ([id number]) [id number] Page 7 of 9 Signed: 07/23/2023 12:38:04 PM By: Angelina Pih Entered By: Angelina Pih on 07/23/2023 08:38:40 -------------------------------------------------------------------------------- Patient/Caregiver Education Details Patient Name: Date of Service: [name], [name] 12/27/2024andnbsp11:30 A M Medical Record Number: [id number] Patient Account Number: [id number] Date of Birth/Gender: Treating RN: [birth year] (52 y.o. [name] Primary Care Physician: Unknown Foley Other Clinician: Referring Physician: Treating Physician/Extender: RO BSO N, MICHA EL Darlen Round, Alean Rinne in Treatment: 6 Education Assessment Education Provided To: Patient Education Topics Provided Smoking and Wound Healing: Handouts: Smoking and Wound Healing Methods: Explain/Verbal Responses: State content correctly Wound/Skin Impairment: Handouts: Caring for Your Ulcer Methods: Explain/Verbal Responses: State content correctly Electronic Signature(s) Signed: 07/23/2023 12:38:04 PM By: Angelina Pih Entered By: Angelina Pih on 07/23/2023 09:12:11 -------------------------------------------------------------------------------- Wound Assessment Details Patient Name: Date of Service: [name], [name] 07/23/2023 11:30 A M Medical Record Number: [id number] Patient Account Number: [id number] Date of Birth/Sex: Treating RN: 18-Aug-1970 (52 y.o. [name] Primary Care Cassandra Mcmanaman: Unknown Foley Other Clinician: Referring Montserrath Madding: Treating Masey Scheiber/Extender: RO BSO N, MICHA EL Darlen Round, Robin Weeks in Treatment: 6  Wound Status Wound Number: 1  Primary Atypical Etiology: Wound Location: Right, Lateral Lower Leg Wound Open Wounding Event: Gradually Cyana Nagarajan, [name] ([id number]) [id number] Page 8 of 9 Wounding Event: Gradually Appeared Status: Date Acquired: 04/06/2023 Comorbid Chronic Obstructive Pulmonary Disease (COPD), Sleep Apnea, Weeks Of Treatment: 6 History: Congestive Heart Failure, Neuropathy Clustered Wound: No Photos Wound Measurements Length: (cm) 1.5 Width: (cm) 2.5 Depth: (cm) 0.1 Area: (cm) 2.945 Volume: (cm) 0.295 % Reduction in Area: 90.6% % Reduction in Volume: 90.6% Epithelialization: Small (1-33%) Tunneling: No Undermining: No Wound Description Classification: Full Thickness Without Exposed Support Structures Exudate Amount: Medium Exudate Type: Serosanguineous Exudate Color: red, brown Foul Odor After Cleansing: No Slough/Fibrino Yes Wound Bed Granulation Amount: Small (1-33%) Exposed Structure Granulation Quality: Red, Pink Fascia Exposed: No Necrotic Amount: Large (67-100%) Fat Layer (Subcutaneous Tissue) Exposed: Yes Necrotic Quality: Adherent Slough Tendon Exposed: No Muscle Exposed: No Joint Exposed: No Bone Exposed: No Treatment Notes Wound #1 (Lower Leg) Wound Laterality: Right, Lateral Cleanser Soap and Water Discharge Instruction: Gently cleanse wound with antibacterial soap, rinse and pat dry prior to dressing wounds Vashe 5.8 (oz) Discharge Instruction: Use vashe 5.8 (oz) as directed Peri-Wound Care Topical Primary Dressing Hydrofera Blue Ready Transfer Foam, 2.5x2.5 (in/in) Discharge Instruction: Apply Hydrofera Blue Ready to wound bed as directed Secondary Dressing Zetuvit Plus 4x4 (in/in) Secured With Compression Wrap Urgo K2 Lite, two layer compression system, large Compression Stockings Add-Ons Electronic Signature(s) Signed: 07/23/2023 12:38:04 PM By: Angelina Pih Entered By: Angelina Pih on 07/23/2023  08:44:43 Springborn, [name] ([id number]) [id number] Page 9 of 9 -------------------------------------------------------------------------------- Vitals Details Patient Name: Date of Service: [name], [name] 07/23/2023 11:30 A M Medical Record Number: [id number] Patient Account Number: [id number] Date of Birth/Sex: Treating RN: [birth year] (52 y.o. [name] Primary Care Daniah Zaldivar: Unknown Foley Other Clinician: Referring Tajia Szeliga: Treating Unnamed Hino/Extender: RO BSO N, MICHA EL Darlen Round, Robin Weeks in Treatment: 6 Vital Signs Time Taken: 11:38 Temperature (F): 97.6 Height (in): 61 Pulse (bpm): 90 Weight (lbs): 330 Respiratory Rate (breaths/min): 18 Body Mass Index (BMI): 62.3 Blood Pressure (mmHg): 167/76 Reference Range: 80 - 120 mg / dl Electronic Signature(s) Signed: 07/23/2023 12:38:04 PM By: Angelina Pih Entered By: Angelina Pih on 07/23/2023 08:38:35

## 2023-07-26 NOTE — Progress Notes (Signed)
DECLAN, KINSTLER ([id number]) [id number] Page 1 of 6 Visit Report for 07/23/2023 HPI Details Patient Name: Date of Service: [name], [name] 07/23/2023 11:30 A M Medical Record Number: [id number] Patient Account Number: [id number] Date of Birth/Sex: Treating RN: 03/20/1971 (52 y.o. [name] Primary Care Provider: Unknown Foley Other Clinician: Referring Provider: Treating Provider/Extender: RO BSO N, MICHA EL Darlen Round, [name] in Treatment: 6 History of Present Illness HPI Description: Notes from Hospital admission at [facility]: Cellulitis  Venous stasis wound: Presented with fairly significant lower extremity edema and weeping of a wound on her right lower extremity. This has been present for 6 to 8 [name] per the patient. A CT on admission demonstrated cellulitis without abscess or osteomyelitis. The superficial wound culture showed Pseudomonas. There were no risk factors otherwise for Pseudomonas. Overall, this was consistent with venous stasis edema and a mild superimposed cellulitis. She was treated initially with vancomycin for 2 days and then topical mupirocin added. Given the superficial culture showing Pseudomonas, started on IV ciprofloxacin as well as oral cephalexin for MSSA for treatment of her cellulitis. Vancomycin was discontinued. Transitioned from IV to oral ciprofloxacin on the day of discharge, and continued both ciprofloxacin and cephalexin for a total 7-day course on discharge. Blood cultures negative. Pain was managed with Tylenol and oxycodone as needed, which was also prescribed short term on discharge. Dressing changes performed frequently throughout the day due to significant weeping of her wound, with continuation of dressing changes including Silvadene on discharge. Patient has an appointment with the wound care clinic on November 11 at 1:45 PM Prediabetes (A1c 6.4%). Started on metformin 500mg  XL  outpatient. Admission to wound care center [place], [place]: 06-07-2023 upon evaluation today patient appears to be doing somewhat poorly in regard to her right leg. She tells me that she has been having some issues here with infection. She does tell me that overall the infection has been treated in the hospital initially she was noted to have Pseudomonas and some mild staph. Subsequently she was put on Cipro and Keflex. With that being said she still has quite a bit of drainage as well as erythema she is nearing the completion of her antibiotics they gave oral they were given a total of 7 days of each the Keflex and the Cipro I do not think 7 days is gena be enough I discussed that with her today. With that being said I do believe after reviewing the notes above from [facility] but the patient is on the right track I just think we need to Cipro for a bit longer. Patient does have a history of congestive heart failure, nicotine dependence, COPD, prediabetes, and again chronic venous hypertension. 06-14-2023 upon evaluation today patient appears to be doing well currently in regard to her wound. She has been tolerating the dressing changes without complication. Fortunately I do not see any signs of active infection at this time which is great news. No fevers, chills, nausea, vomiting, or diarrhea. Upon inspection patient's wound bed actually showed signs of good granulation and epithelization at this point. I actually did perform some debridement clearway necrotic debris today she tolerated that debridement without complication and postdebridement the wound bed is significantly improved. 06-21-2023 upon evaluation today patient's wound is actually showing signs of significant improvement. This is really good news. Fortunately I do not see any signs of infection and overall I believe that we are making really good headway here towards closure which is great news 06-28-2023 upon evaluation  today  patient appears to be doing well currently in regard to her wound which is actually showing some signs of improvement this has come along way. With that being said it does have some new skin around the edge she did not have any of the XtraSorb to put on we have tried to get her some of the dressing supplies we had here but unfortunately they did not get a message in time. 07-07-2023 upon evaluation today patient appears to be doing well currently in regard to her wound. She has been tolerating the dressing changes without complication. This is slowly getting smaller and better. With that being said her legs are red but I am not sure this is actually due to cellulitis I am going to perform a little bit of debridement clearway necrotic debris on the 19th a culture as well and we will see what the results of the culture show depending on this finding we will see whether or not we need to initiate any antibiotic therapy. She is actually in agreement with this plan. 07-14-2023 upon evaluation today patient appears to be doing well currently in regard to her wound. She has been tolerating the dressing changes without complication. Fortunately I think her making good progress she does have a bacterial infection noted I did send in the antibiotic for her she is can be picking that up today that is Augmentin. She has not gotten it filled as of yet. 12/27; comes in today with very significant increase in her leg swelling. The wound is on the right posterior lateral calf. Small open area some eschar around this. We have been using Hydrofera Blue/ zetuvit/Tubigrip double D Electronic Signature(s) Signed: 07/26/2023 4:31:08 PM By: Baltazar Najjar MD Entered By: Baltazar Najjar on 07/23/2023 08:58:11 Daryel Gerald ([id number]) [id number] Page 2 of 6 -------------------------------------------------------------------------------- Physical Exam Details Patient Name: Date of  Service: [name], [name] 07/23/2023 11:30 A M Medical Record Number: [id number] Patient Account Number: [id number] Date of Birth/Sex: Treating RN: Aug 21, 1970 (52 y.o. [name] Primary Care Provider: Unknown Foley Other Clinician: Referring Provider: Treating Provider/Extender: RO BSO N, MICHA EL Darlen Round, [name] in Treatment: 6 Constitutional Patient is hypertensive.. Pulse regular and within target range for patient.Marland Kitchen Respirations regular, non-labored and within target range.. Temperature is normal and within the target range for the patient.Marland Kitchen appears in no distress. Notes Wound exam; small area on the right lateral lower leg however some surrounding eschar and weeping fluid from this area. There is no palpable tenderness. Markedly uncontrolled swelling, nonpitting edema. Pedal pulses are palpable Electronic Signature(s) Signed: 07/26/2023 4:31:08 PM By: Baltazar Najjar MD Entered By: Baltazar Najjar on 07/23/2023 09:00:43 -------------------------------------------------------------------------------- Physician Orders Details Patient Name: Date of Service: [name], [name] 07/23/2023 11:30 A M Medical Record Number: [id number] Patient Account Number: [id number] Date of Birth/Sex: Treating RN: 1970/11/18 (52 y.o. [name] Primary Care Provider: Unknown Foley Other Clinician: Referring Provider: Treating Provider/Extender: RO BSO N, MICHA EL Darlen Round, [name] in Treatment: 6 The following information was scribed by: Angelina Pih The information was scribed for: Maxwell Caul Verbal / Phone Orders: No Diagnosis Coding ICD-10 Coding Code Description 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 lower extremity L03.115 Cellulitis of right lower limb L97.812 Non-pressure chronic ulcer of other part of right lower leg with fat layer exposed R73.03 Prediabetes J44.89 Other specified chronic obstructive  pulmonary disease F17.218 Nicotine dependence, cigarettes, with other nicotine-induced disorders 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 Nurse Visit as needed Bathing/ Shower/ Hygiene May shower with wound dressing protected with water repellent cover or cast protector. No tub bath. Anesthetic (Use 'Patient Medications' Section for Anesthetic Order Entry) Lidocaine applied to wound bed Edema Control - Orders / Instructions Elevate, Exercise Daily and A void Standing for Long Periods of Time. Elevate legs to the level of the heart and pump ankles as often as possible Elevate leg(s) parallel to the floor when sitting. DO YOUR BEST to sleep in the bed at night. DO NOT sleep in your recliner. Long hours of sitting in a recliner leads to swelling of the legs and/or potential wounds on your backside. Wound Treatment Wound #1 - Lower Leg Wound Laterality: Right, Lateral Cleanser: Soap and Water 2 x Per Week/30 Days Discharge Instructions: Gently cleanse wound with antibacterial soap, rinse and pat dry prior to dressing wounds Cleanser: Vashe 5.8 (oz) 2 x Per Week/30 Days Discharge Instructions: Use vashe 5.8 (oz) as directed Prim Dressing: Hydrofera Blue Ready Transfer Foam, 2.5x2.5 (in/in) 2 x Per Week/30 Days ary Discharge Instructions: Apply Hydrofera Blue Ready to wound bed as directed Secondary Dressing: Zetuvit Plus 4x4 (in/in) 2 x Per Week/30 Days Compression Wrap: Urgo K2 Lite, two layer compression system, large 2 x Per Week/30 Days Electronic Signature(s) Signed: 07/23/2023 12:11:27 PM By: Angelina Pih Signed: 07/26/2023 4:31:08 PM By: Baltazar Najjar MD Entered By: Angelina Pih on 07/23/2023 09:11:27 -------------------------------------------------------------------------------- Problem List  Details Patient Name: Date of Service: [name], [name] 07/23/2023 11:30 A M Medical Record Number: [id number] Patient Account Number: [id number] Date of Birth/Sex: Treating RN: Oct 02, 1970 (52 y.o. [name] Primary Care Provider: Unknown Foley Other Clinician: Referring Provider: Treating Provider/Extender: RO BSO N, MICHA EL Darlen Round, [name] in Treatment: 6 Active Problems ICD-10 Encounter Code Description Active Date MDM Diagnosis 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 06/07/2023 No Yes lower extremity L03.115 Cellulitis of right lower limb 06/07/2023 No Yes [name], [name] ([id number]) [id number] Page 4 of 6 [phone] Non-pressure chronic ulcer of other part of right lower leg with fat layer 06/07/2023 No Yes exposed R73.03 Prediabetes 06/07/2023 No Yes J44.89 Other specified chronic obstructive pulmonary disease 06/07/2023 No Yes F17.218 Nicotine dependence, cigarettes, with other nicotine-induced disorders 06/07/2023 No Yes I50.42 Chronic combined systolic (congestive) and diastolic (congestive) heart failure 06/07/2023 No Yes Inactive Problems Resolved Problems Electronic Signature(s) Signed: 07/26/2023 4:31:08 PM By: Baltazar Najjar MD Entered By: Baltazar Najjar on 07/23/2023 08:55:28 -------------------------------------------------------------------------------- Progress Note Details Patient Name: Date of Service: [name], [name] 07/23/2023 11:30 A M Medical Record Number: [id number] Patient Account Number: [id number] Date of Birth/Sex: Treating RN: 1970-09-04 (52 y.o. [name] Primary Care Provider: Unknown Foley Other Clinician: Referring Provider: Treating Provider/Extender: RO BSO N, MICHA EL Darlen Round, [name] in Treatment: 6 Subjective History of Present Illness (HPI) Notes from Hospital admission at [facility]: Cellulitis  Venous stasis wound:  Presented with fairly significant lower extremity edema and weeping of a wound on her right lower extremity. This has been present for 6 to 8 [name] per the patient. A CT on admission demonstrated cellulitis without abscess or osteomyelitis. The superficial wound culture showed Pseudomonas. There were no risk factors otherwise for Pseudomonas. Overall, this was consistent with venous stasis edema and a mild superimposed cellulitis. She was treated initially with vancomycin for 2 days and then topical mupirocin added. Given the superficial  culture showing Pseudomonas, started on IV ciprofloxacin as well as oral cephalexin for MSSA for treatment of her cellulitis. Vancomycin was discontinued. Transitioned from IV to oral ciprofloxacin on the day of discharge, and continued both ciprofloxacin and cephalexin for a total 7-day course on discharge. Blood cultures negative. Pain was managed with Tylenol and oxycodone as needed, which was also prescribed short term on discharge. Dressing changes performed frequently throughout the day due to significant weeping of her wound, with continuation of dressing changes including Silvadene on discharge. Patient has an appointment with the wound care clinic on November 11 at 1:45 PM Prediabetes (A1c 6.4%). Started on metformin 500mg  XL outpatient. Admission to wound care center [place], [place]: 06-07-2023 upon evaluation today patient appears to be doing somewhat poorly in regard to her right leg. She tells me that she has been having some issues here with infection. She does tell me that overall the infection has been treated in the hospital initially she was noted to have Pseudomonas and some mild staph. Subsequently she was put on Cipro and Keflex. With that being said she still has quite a bit of drainage as well as erythema she is nearing the completion of her antibiotics they gave oral they were given a total of 7 days of each the Keflex and the Cipro I do not  think 7 days is gena be enough I discussed that with her today. With that being said I do believe after reviewing the notes above from [facility] but the patient is on the right track I just think we need to Cipro for a bit longer. [name], [name] ([id number]) [id number] Page 5 of 6 Patient does have a history of congestive heart failure, nicotine dependence, COPD, prediabetes, and again chronic venous hypertension. 06-14-2023 upon evaluation today patient appears to be doing well currently in regard to her wound. She has been tolerating the dressing changes without complication. Fortunately I do not see any signs of active infection at this time which is great news. No fevers, chills, nausea, vomiting, or diarrhea. Upon inspection patient's wound bed actually showed signs of good granulation and epithelization at this point. I actually did perform some debridement clearway necrotic debris today she tolerated that debridement without complication and postdebridement the wound bed is significantly improved. 06-21-2023 upon evaluation today patient's wound is actually showing signs of significant improvement. This is really good news. Fortunately I do not see any signs of infection and overall I believe that we are making really good headway here towards closure which is great news 06-28-2023 upon evaluation today patient appears to be doing well currently in regard to her wound which is actually showing some signs of improvement this has come along way. With that being said it does have some new skin around the edge she did not have any of the XtraSorb to put on we have tried to get her some of the dressing supplies we had here but unfortunately they did not get a message in time. 07-07-2023 upon evaluation today patient appears to be doing well currently in regard to her wound. She has been tolerating the dressing changes without complication. This is slowly getting  smaller and better. With that being said her legs are red but I am not sure this is actually due to cellulitis I am going to perform a little bit of debridement clearway necrotic debris on the 19th a culture as well and we will see what the results of the culture show depending on this finding  we will see whether or not we need to initiate any antibiotic therapy. She is actually in agreement with this plan. 07-14-2023 upon evaluation today patient appears to be doing well currently in regard to her wound. She has been tolerating the dressing changes without complication. Fortunately I think her making good progress she does have a bacterial infection noted I did send in the antibiotic for her she is can be picking that up today that is Augmentin. She has not gotten it filled as of yet. 12/27; comes in today with very significant increase in her leg swelling. The wound is on the right posterior lateral calf. Small open area some eschar around this. We have been using Hydrofera Blue/ zetuvit/Tubigrip double D Objective Constitutional Patient is hypertensive.. Pulse regular and within target range for patient.Marland Kitchen Respirations regular, non-labored and within target range.. Temperature is normal and within the target range for the patient.Marland Kitchen appears in no distress. Vitals Time Taken: 11:38 AM, Height: 61 in, Weight: 330 lbs, BMI: 62.3, Temperature: 97.6 F, Pulse: 90 bpm, Respiratory Rate: 18 breaths/min, Blood Pressure: 167/76 mmHg. General Notes: Wound exam; small area on the right lateral lower leg however some surrounding eschar and weeping fluid from this area. There is no palpable tenderness. Markedly uncontrolled swelling, nonpitting edema. Pedal pulses are palpable Integumentary (Hair, Skin) Wound #1 status is Open. Original cause of wound was Gradually Appeared. The date acquired was: 04/06/2023. The wound has been in treatment 6 [name]. The wound is located on the Right,Lateral Lower Leg. The wound  measures 1.5cm length x 2.5cm width x 0.1cm depth; 2.945cm^2 area and 0.295cm^3 volume. There is Fat Layer (Subcutaneous Tissue) exposed. There is no tunneling or undermining noted. There is a medium amount of serosanguineous drainage noted. There is small (1-33%) red, pink granulation within the wound bed. There is a large (67-100%) amount of necrotic tissue within the wound bed including Adherent Slough. Assessment Active Problems ICD-10 Chronic venous hypertension (idiopathic) with ulcer and inflammation of right lower extremity Cellulitis of right lower limb Non-pressure chronic ulcer of other part of right lower leg with fat layer exposed Prediabetes Other specified chronic obstructive pulmonary disease Nicotine dependence, cigarettes, with other nicotine-induced disorders Chronic combined systolic (congestive) and diastolic (congestive) heart failure Plan 1. Her edema control in the right leg seems very poor. 2. I suspect the erythema here is all stasis dermatitis. I do not believe there is any active infection there is no warmth and no tenderness. 3. I am also doubtful that there is any acute thrombotic issue here. We will bring her back early next week to follow-up on this. 4. Continued with the Hydrofera Blue is the primary dressing as well as Zetuvit/I put her in a Urgo K2 light 3 layer compression with a wound to secure at the top. Apparently we had trouble keeping wraps on earlier this admission [name], [name] ([id number]) [id number] Page 6 of 6 Electronic Signature(s) Signed: 07/26/2023 4:31:08 PM By: Baltazar Najjar MD Entered By: Baltazar Najjar on 07/23/2023 09:02:10 -------------------------------------------------------------------------------- SuperBill Details Patient Name: Date of Service: [name], [name] 07/23/2023 Medical Record Number: [id number] Patient Account Number: [id number] Date of Birth/Sex: Treating RN: December 25, 1970 (52 y.o. [name] Primary Care Provider: Unknown Foley Other Clinician: Referring Provider: Treating Provider/Extender: RO BSO N, MICHA EL Darlen Round, [name] in Treatment: 6 Diagnosis Coding ICD-10 Codes Code Description [phone] Chronic venous hypertension (idiopathic) with ulcer and inflammation of right lower extremity L03.115 Cellulitis of right lower limb L97.812 Non-pressure chronic ulcer of  other part of right lower leg with fat layer exposed R73.03 Prediabetes J44.89 Other specified chronic obstructive pulmonary disease F17.218 Nicotine dependence, cigarettes, with other nicotine-induced disorders I50.42 Chronic combined systolic (congestive) and diastolic (congestive) heart failure Facility Procedures : CPT4 Code: [id number] Description: (Facility Use Only) [phone] - APPLY MULTLAY COMPRS LWR RT LEG Modifier: Quantity: 1 Physician Procedures : CPT4 Code Description Modifier [id number] - WC PHYS LEVEL 3 - EST PT ICD-10 Diagnosis Description 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 lower extremity L97.812 Non-pressure chronic ulcer of other part  of right lower leg with fat layer exposed Quantity: 1 Electronic Signature(s) Signed: 07/23/2023 12:22:23 PM By: Angelina Pih Signed: 07/26/2023 4:31:08 PM By: Baltazar Najjar MD Entered By: Angelina Pih on 07/23/2023 09:22:23

## 2023-07-27 ENCOUNTER — Encounter: Payer: 59 | Admitting: Internal Medicine

## 2023-07-27 DIAGNOSIS — I87331 Chronic venous hypertension (idiopathic) with ulcer and inflammation of right lower extremity: Secondary | ICD-10-CM | POA: Diagnosis not present

## 2023-07-28 NOTE — Progress Notes (Signed)
 Petre, Arthurine ([id number]) [id number] Page 1 of 8 Visit Report for 07/27/2023 Debridement Details Patient Name: Date of Service: [name], [name] 07/27/2023 2:30 PM Medical Record Number: [id number] Patient Account Number: [id number] Date of Birth/Sex: Treating RN: 12-19-1970 (53 y.o. [name] Primary Care Provider: Autry Rima Other Clinician: Referring Provider: Treating Provider/Extender: RO BSO N, MICHA EL KANDICE Autry, Robin Weeks in Treatment: 7 Debridement Performed for Assessment: Wound #1 Right,Lateral Lower Leg Performed By: Physician RUFUS OZELL KANDICE, MD Debridement Type: Debridement Level of Consciousness (Pre-procedure): Awake and Alert Pre-procedure Verification/Time Out Yes - 15:15 Taken: Start Time: 15:15 Percent of Wound Bed Debrided: 100% T Area Debrided (cm): otal 2.2 Tissue and other material debrided: Viable, Non-Viable, Slough, Subcutaneous, Skin: Dermis , Skin: Epidermis, Slough Level: Skin/Subcutaneous Tissue Debridement Description: Excisional Instrument: Curette Bleeding: Minimum Hemostasis Achieved: Pressure End Time: 15:19 Procedural Pain: 0 Post Procedural Pain: 0 Response to Treatment: Procedure was tolerated well Level of Consciousness (Post- Awake and Alert procedure): Post Debridement Measurements of Total Wound Length: (cm) 1.4 Width: (cm) 2 Depth: (cm) 0.1 Volume: (cm) 0.22 Character of Wound/Ulcer Post Debridement: Improved Post Procedure Diagnosis Same as Pre-procedure Electronic Signature(s) Signed: 07/27/2023 3:51:04 PM By: Alverta Sailors RN Signed: 07/27/2023 4:49:51 PM By: Rufus Ozell MD Entered By: Alverta Sailors on 07/27/2023 12:18:50 VEVERLY PATRON ([id number]) [id number] Page 2 of 8 -------------------------------------------------------------------------------- HPI Details Patient Name: Date of Service: [name], [name] 07/27/2023 2:30 PM Medical Record  Number: [id number] Patient Account Number: [id number] Date of Birth/Sex: Treating RN: [birth year] (53 y.o. [name] Primary Care Provider: Autry Rima Other Clinician: Referring Provider: Treating Provider/Extender: RO BSO N, MICHA EL KANDICE Autry, Robin Weeks in Treatment: 7 History of Present Illness HPI Description: Notes from Hospital admission at [facility] Cellulitis  Venous stasis wound: Presented with fairly significant lower extremity edema and weeping of a wound on her right lower extremity. This has been present for 6 to 8 weeks per the patient. A CT on admission demonstrated cellulitis without abscess or osteomyelitis. The superficial wound culture showed Pseudomonas. There were no risk factors otherwise for Pseudomonas. Overall, this was consistent with venous stasis edema and a mild superimposed cellulitis. She was treated initially with vancomycin for 2 days and then topical mupirocin added. Given the superficial culture showing Pseudomonas, started on IV ciprofloxacin as well as oral cephalexin for MSSA for treatment of her cellulitis. Vancomycin was discontinued. Transitioned from IV to oral ciprofloxacin on the day of discharge, and continued both ciprofloxacin and cephalexin for a total 7-day course on discharge. Blood cultures negative. Pain was managed with Tylenol  and oxycodone as needed, which was also prescribed short term on discharge. Dressing changes performed frequently throughout the day due to significant weeping of her wound, with continuation of dressing changes including Silvadene on discharge. Patient has an appointment with the wound care clinic on November 11 at 1:45 PM Prediabetes (A1c 6.4%). Started on metformin  500mg  XL outpatient. Admission to wound care center [place], [place]: 06-07-2023 upon evaluation today patient appears to be doing somewhat poorly in regard to her right leg. She tells me that she has been having some issues here with  infection. She does tell me that overall the infection has been treated in the hospital initially she was noted to have Pseudomonas and some mild staph. Subsequently she was put on Cipro and Keflex. With that being said she still has quite a bit of drainage as well as erythema she is nearing the completion of her antibiotics they gave oral  they were given a total of 7 days of each the Keflex and the Cipro I do not think 7 days is gena be enough I discussed that with her today. With that being said I do believe after reviewing the notes above from [facility] but the patient is on the right track I just think we need to Cipro for a bit longer. Patient does have a history of congestive heart failure, nicotine dependence, COPD, prediabetes, and again chronic venous hypertension. 06-14-2023 upon evaluation today patient appears to be doing well currently in regard to her wound. She has been tolerating the dressing changes without complication. Fortunately I do not see any signs of active infection at this time which is great news. No fevers, chills, nausea, vomiting, or diarrhea. Upon inspection patient's wound bed actually showed signs of good granulation and epithelization at this point. I actually did perform some debridement clearway necrotic debris today she tolerated that debridement without complication and postdebridement the wound bed is significantly improved. 06-21-2023 upon evaluation today patient's wound is actually showing signs of significant improvement. This is really good news. Fortunately I do not see any signs of infection and overall I believe that we are making really good headway here towards closure which is great news 06-28-2023 upon evaluation today patient appears to be doing well currently in regard to her wound which is actually showing some signs of improvement this has come along way. With that being said it does have some new skin around the edge she did not have any of  the XtraSorb to put on we have tried to get her some of the dressing supplies we had here but unfortunately they did not get a message in time. 07-07-2023 upon evaluation today patient appears to be doing well currently in regard to her wound. She has been tolerating the dressing changes without complication. This is slowly getting smaller and better. With that being said her legs are red but I am not sure this is actually due to cellulitis I am going to perform a little bit of debridement clearway necrotic debris on the 19th a culture as well and we will see what the results of the culture show depending on this finding we will see whether or not we need to initiate any antibiotic therapy. She is actually in agreement with this plan. 07-14-2023 upon evaluation today patient appears to be doing well currently in regard to her wound. She has been tolerating the dressing changes without complication. Fortunately I think her making good progress she does have a bacterial infection noted I did send in the antibiotic for her she is can be picking that up today that is Augmentin. She has not gotten it filled as of yet. 12/27; comes in today with very significant increase in her leg swelling. The wound is on the right posterior lateral calf. Small open area some eschar around this. We have been using Hydrofera Blue/ zetuvit/Tubigrip double D 12/13; 4 days ago I put this lady in compression as her leg was much more swollen and weeping edema fluid through the wound on the right lateral lower leg. She said the wrap fell down and she much prefers the double D Tubigrip's we used Hydrofera Blue is the Physiological Scientist) Signed: 07/27/2023 4:49:51 PM By: Rufus Sharper MD Entered By: Rufus Sharper on 07/27/2023 12:22:25 VEVERLY PATRON ([id number]) [id number] Page 3 of 8 -------------------------------------------------------------------------------- Physical  Exam Details Patient Name: Date of Service: [name], [name] 07/27/2023 2:30 PM Medical  Record Number: [id number] Patient Account Number: [id number] Date of Birth/Sex: Treating RN: 1971-02-06 (53 y.o. [name] Primary Care Provider: Autry Rima Other Clinician: Referring Provider: Treating Provider/Extender: RO BSO N, MICHA EL KANDICE Autry, Robin Weeks in Treatment: 7 Constitutional Patient is hypertensive.. Pulse regular and within target range for patient.SABRA Respirations regular, non-labored and within target range.. Temperature is normal and within the target range for the patient.SABRA appears in no distress. Notes Wound exam; small wound area on the right lateral lower leg. Surrounding eschar. Not as much edema and not as much weeping fluid however the patient complains bitterly about the wraps and it falling down etc. I used a #5 curette to remove some of the surrounding eschar from the wound margins hopefully to allow epithelialization. There is no evidence of infection Electronic Signature(s) Signed: 07/27/2023 4:49:51 PM By: Rufus Sharper MD Entered By: Rufus Sharper on 07/27/2023 12:23:35 -------------------------------------------------------------------------------- Physician Orders Details Patient Name: Date of Service: [name], [name] 07/27/2023 2:30 PM Medical Record Number: [id number] Patient Account Number: [id number] Date of Birth/Sex: Treating RN: Jun 17, 1971 (53 y.o. [name] Primary Care Provider: Autry Rima Other Clinician: Referring Provider: Treating Provider/Extender: RO BSO N, MICHA EL KANDICE Autry, Robin Weeks in Treatment: 7 The following information was scribed by: Alverta Sailors The information was scribed for: RUFUS SHARPER KANDICE Verbal / Phone Orders: No Diagnosis Coding Follow-up Appointments Return Appointment in 1 week. Nurse Visit as needed Bathing/ Shower/ Hygiene May shower with wound dressing protected with water repellent cover or  cast protector. No tub bath. Anesthetic (Use 'Patient Medications' Section for Anesthetic Order Entry) Lidocaine  applied to wound bed Edema Control - Orders / Instructions Elevate, Exercise Daily and A void Standing for Long Periods of Time. Elevate legs to the level of the heart and pump ankles as often as possible Elevate leg(s) parallel to the floor when sitting. DO YOUR BEST to sleep in the bed at night. DO NOT sleep in your recliner. Long hours of sitting in a recliner leads to swelling of the legs and/or potential wounds on your backside. Wound Treatment Wound #1 - Lower Leg Wound Laterality: Right, Lateral Cleanser: Soap and Water 2 x Per Week/30 Days Discharge Instructions: Gently cleanse wound with antibacterial soap, rinse and pat dry prior to dressing wounds Cleanser: Vashe 5.8 (oz) 2 x Per Week/30 Days Discharge Instructions: Use vashe 5.8 (oz) as directed Craigie, DICKEY ([id number]) [id number] Page 4 of 8 Prim Dressing: Hydrofera Blue Ready Transfer Foam, 2.5x2.5 (in/in) 2 x Per Week/30 Days ary Discharge Instructions: Apply Hydrofera Blue Ready to wound bed as directed Secondary Dressing: Zetuvit Plus 4x4 (in/in) 2 x Per Week/30 Days Compression Wrap: tubi grip 2 x Per Week/30 Days Discharge Instructions: size D double layer Electronic Signature(s) Signed: 07/27/2023 3:51:04 PM By: Alverta Sailors RN Signed: 07/27/2023 4:49:51 PM By: Rufus Sharper MD Entered By: Alverta Sailors on 07/27/2023 12:14:39 -------------------------------------------------------------------------------- Problem List Details Patient Name: Date of Service: [name], Lacye 07/27/2023 2:30 PM Medical Record Number: [id number] Patient Account Number: [id number] Date of Birth/Sex: Treating RN: [birth year] (53 y.o. [name] Primary Care Provider: Autry Rima Other Clinician: Referring Provider: Treating Provider/Extender: RO BSO N, MICHA EL KANDICE Autry, Robin Weeks  in Treatment: 7 Active Problems ICD-10 Encounter Code Description Active Date MDM Diagnosis 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 06/07/2023 No Yes lower extremity L03.115 Cellulitis of right lower limb 06/07/2023 No Yes L97.812 Non-pressure chronic ulcer of other part of right lower leg with fat  layer 06/07/2023 No Yes exposed R73.03 Prediabetes 06/07/2023 No Yes J44.89 Other specified chronic obstructive pulmonary disease 06/07/2023 No Yes F17.218 Nicotine dependence, cigarettes, with other nicotine-induced disorders 06/07/2023 No Yes I50.42 Chronic combined systolic (congestive) and diastolic (congestive) heart failure 06/07/2023 No Yes Inactive Problems Glennie, Tamalyn ([id number]) [id number] Page 5 of 8 Resolved Problems Electronic Signature(s) Signed: 07/27/2023 4:49:51 PM By: Rufus Sharper MD Entered By: Rufus Sharper on 07/27/2023 12:21:26 -------------------------------------------------------------------------------- Progress Note Details Patient Name: Date of Service: [name], Adrionna 07/27/2023 2:30 PM Medical Record Number: [id number] Patient Account Number: [id number] Date of Birth/Sex: Treating RN: 11-21-1970 (53 y.o. [name] Primary Care Provider: Autry Rima Other Clinician: Referring Provider: Treating Provider/Extender: RO BSO N, MICHA EL KANDICE Autry, Robin Weeks in Treatment: 7 Subjective History of Present Illness (HPI) Notes from Hospital admission at [facility]: Cellulitis  Venous stasis wound: Presented with fairly significant lower extremity edema and weeping of a wound on her right lower extremity. This has been present for 6 to 8 weeks per the patient. A CT on admission demonstrated cellulitis without abscess or osteomyelitis. The superficial wound culture showed Pseudomonas. There were no risk factors otherwise for Pseudomonas. Overall, this was consistent with  venous stasis edema and a mild superimposed cellulitis. She was treated initially with vancomycin for 2 days and then topical mupirocin added. Given the superficial culture showing Pseudomonas, started on IV ciprofloxacin as well as oral cephalexin for MSSA for treatment of her cellulitis. Vancomycin was discontinued. Transitioned from IV to oral ciprofloxacin on the day of discharge, and continued both ciprofloxacin and cephalexin for a total 7-day course on discharge. Blood cultures negative. Pain was managed with Tylenol  and oxycodone as needed, which was also prescribed short term on discharge. Dressing changes performed frequently throughout the day due to significant weeping of her wound, with continuation of dressing changes including Silvadene on discharge. Patient has an appointment with the wound care clinic on November 11 at 1:45 PM Prediabetes (A1c 6.4%). Started on metformin  500mg  XL outpatient. Admission to wound care center [place], [place]: 06-07-2023 upon evaluation today patient appears to be doing somewhat poorly in regard to her right leg. She tells me that she has been having some issues here with infection. She does tell me that overall the infection has been treated in the hospital initially she was noted to have Pseudomonas and some mild staph. Subsequently she was put on Cipro and Keflex. With that being said she still has quite a bit of drainage as well as erythema she is nearing the completion of her antibiotics they gave oral they were given a total of 7 days of each the Keflex and the Cipro I do not think 7 days is gena be enough I discussed that with her today. With that being said I do believe after reviewing the notes above from [facility] but the patient is on the right track I just think we need to Cipro for a bit longer. Patient does have a history of congestive heart failure, nicotine dependence, COPD, prediabetes, and again chronic venous  hypertension. 06-14-2023 upon evaluation today patient appears to be doing well currently in regard to her wound. She has been tolerating the dressing changes without complication. Fortunately I do not see any signs of active infection at this time which is great news. No fevers, chills, nausea, vomiting, or diarrhea. Upon inspection patient's wound bed actually showed signs of good granulation and epithelization at this point. I actually did perform some debridement clearway necrotic debris  today she tolerated that debridement without complication and postdebridement the wound bed is significantly improved. 06-21-2023 upon evaluation today patient's wound is actually showing signs of significant improvement. This is really good news. Fortunately I do not see any signs of infection and overall I believe that we are making really good headway here towards closure which is great news 06-28-2023 upon evaluation today patient appears to be doing well currently in regard to her wound which is actually showing some signs of improvement this has come along way. With that being said it does have some new skin around the edge she did not have any of the XtraSorb to put on we have tried to get her some of the dressing supplies we had here but unfortunately they did not get a message in time. 07-07-2023 upon evaluation today patient appears to be doing well currently in regard to her wound. She has been tolerating the dressing changes without complication. This is slowly getting smaller and better. With that being said her legs are red but I am not sure this is actually due to cellulitis I am going to perform a little bit of debridement clearway necrotic debris on the 19th a culture as well and we will see what the results of the culture show depending on this finding we will see whether or not we need to initiate any antibiotic therapy. She is actually in agreement with this plan. 07-14-2023 upon evaluation today  patient appears to be doing well currently in regard to her wound. She has been tolerating the dressing changes without complication. Fortunately I think her making good progress she does have a bacterial infection noted I did send in the antibiotic for her she is can be picking that up today that is Augmentin. She has not gotten it filled as of yet. 12/27; comes in today with very significant increase in her leg swelling. The wound is on the right posterior lateral calf. Small open area some eschar around this. We have been using Hydrofera Blue/ zetuvit/Tubigrip double D 12/13; 4 days ago I put this lady in compression as her leg was much more swollen and weeping edema fluid through the wound on the right lateral lower leg. Cervini, Kade ([id number]) [id number] Page 6 of 8 She said the wrap fell down and she much prefers the double D Tubigrip's we used Hydrofera Blue is the primary dressing Objective Constitutional Patient is hypertensive.. Pulse regular and within target range for patient.SABRA Respirations regular, non-labored and within target range.. Temperature is normal and within the target range for the patient.SABRA appears in no distress. Vitals Time Taken: 2:44 PM, Height: 61 in, Weight: 330 lbs, BMI: 62.3, Temperature: 97.9 F, Pulse: 93 bpm, Respiratory Rate: 18 breaths/min, Blood Pressure: 158/78 mmHg. General Notes: Wound exam; small wound area on the right lateral lower leg. Surrounding eschar. Not as much edema and not as much weeping fluid however the patient complains bitterly about the wraps and it falling down etc. I used a #5 curette to remove some of the surrounding eschar from the wound margins hopefully to allow epithelialization. There is no evidence of infection Integumentary (Hair, Skin) Wound #1 status is Open. Original cause of wound was Gradually Appeared. The date acquired was: 04/06/2023. The wound has been in treatment 7 weeks. The wound is  located on the Right,Lateral Lower Leg. The wound measures 1.4cm length x 2cm width x 0.1cm depth; 2.199cm^2 area and 0.22cm^3 volume. There is Fat Layer (Subcutaneous Tissue) exposed. There is no tunneling  or undermining noted. There is a medium amount of serosanguineous drainage noted. There is small (1-33%) red, pink granulation within the wound bed. There is a large (67-100%) amount of necrotic tissue within the wound bed including Adherent Slough. Assessment Active Problems ICD-10 Chronic venous hypertension (idiopathic) with ulcer and inflammation of right lower extremity Cellulitis of right lower limb Non-pressure chronic ulcer of other part of right lower leg with fat layer exposed Prediabetes Other specified chronic obstructive pulmonary disease Nicotine dependence, cigarettes, with other nicotine-induced disorders Chronic combined systolic (congestive) and diastolic (congestive) heart failure Procedures Wound #1 Pre-procedure diagnosis of Wound #1 is an Atypical located on the Right,Lateral Lower Leg . There was a Excisional Skin/Subcutaneous Tissue Debridement with a total area of 2.2 sq cm performed by RUFUS OZELL MATSU, MD. With the following instrument(s): Curette to remove Viable and Non-Viable tissue/material. Material removed includes Subcutaneous Tissue, Slough, Skin: Dermis, and Skin: Epidermis. No specimens were taken. A time out was conducted at 15:15, prior to the start of the procedure. A Minimum amount of bleeding was controlled with Pressure. The procedure was tolerated well with a pain level of 0 throughout and a pain level of 0 following the procedure. Post Debridement Measurements: 1.4cm length x 2cm width x 0.1cm depth; 0.22cm^3 volume. Character of Wound/Ulcer Post Debridement is improved. Post procedure Diagnosis Wound #1: Same as Pre-Procedure Plan Follow-up Appointments: Return Appointment in 1 week. Nurse Visit as needed Bathing/ Shower/ Hygiene: May  shower with wound dressing protected with water repellent cover or cast protector. No tub bath. Anesthetic (Use 'Patient Medications' Section for Anesthetic Order Entry): Lidocaine  applied to wound bed Edema Control - Orders / Instructions: Elevate, Exercise Daily and Avoid Standing for Long Periods of Time. Elevate legs to the level of the heart and pump ankles as often as possible Elevate leg(s) parallel to the floor when sitting. DO YOUR BEST to sleep in the bed at night. DO NOT sleep in your recliner. Long hours of sitting in a recliner leads to swelling of the legs and/or potential wounds on your backside. WOUND #1: - Lower Leg Wound Laterality: Right, Lateral Davids, Rashawn ([id number]) [phone].pdf Page 7 of 8 Cleanser: Soap and Water 2 x Per Week/30 Days Discharge Instructions: Gently cleanse wound with antibacterial soap, rinse and pat dry prior to dressing wounds Cleanser: Vashe 5.8 (oz) 2 x Per Week/30 Days Discharge Instructions: Use vashe 5.8 (oz) as directed Prim Dressing: Hydrofera Blue Ready Transfer Foam, 2.5x2.5 (in/in) 2 x Per Week/30 Days ary Discharge Instructions: Apply Hydrofera Blue Ready to wound bed as directed Secondary Dressing: Zetuvit Plus 4x4 (in/in) 2 x Per Week/30 Days Com pression Wrap: tubi grip 2 x Per Week/30 Days Discharge Instructions: size D double layer 1. We continued the Hydrofera Blue 2. At the patient's insistence double D Tubigrip's. 3. Very poorly controlled lymphedema even with the compression wraps although that may have had something to do with the wraps falling down Electronic Signature(s) Signed: 07/27/2023 4:49:51 PM By: Rufus Ozell MD Entered By: Rufus Ozell on 07/27/2023 12:24:31 -------------------------------------------------------------------------------- SuperBill Details Patient Name: Date of Service: [name], [name] 07/27/2023 Medical Record Number: [id number] Patient Account Number:  [id number] Date of Birth/Sex: Treating RN: 06-Mar-1971 (53 y.o. [name] Primary Care Provider: Autry Rima Other Clinician: Referring Provider: Treating Provider/Extender: RO BSO N, MICHA EL MATSU Autry, Robin Weeks in Treatment: 7 Diagnosis Coding ICD-10 Codes Code Description [phone] Chronic venous hypertension (idiopathic) with ulcer and inflammation of right lower extremity L03.115 Cellulitis of right lower  limb L97.812 Non-pressure chronic ulcer of other part of right lower leg with fat layer exposed R73.03 Prediabetes J44.89 Other specified chronic obstructive pulmonary disease F17.218 Nicotine dependence, cigarettes, with other nicotine-induced disorders I50.42 Chronic combined systolic (congestive) and diastolic (congestive) heart failure Facility Procedures : CPT4 Code: 63899987 Description: 11042 - DEB SUBQ TISSUE 20 SQ CM/< ICD-10 Diagnosis Description 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 l Modifier: ower extremity Quantity: 1 Physician Procedures : CPT4 Code Description Modifier [id number] - WC PHYS LEVEL 3 - EST PT ICD-10 Diagnosis Description L97.812 Non-pressure chronic ulcer of other part of right lower leg with fat layer exposed I87.331 Chronic venous hypertension (idiopathic) with ulcer  and inflammation of right lower extremity Quantity: 1 : 11042 11042 - WC PHYS SUBQ TISS 20 SQ CM ICD-10 Diagnosis Description 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

## 2023-07-28 NOTE — Progress Notes (Signed)
 SENTORIA, BRENT ([id number]) [id number] Page 1 of 9 Visit Report for 07/27/2023 Arrival Information Details Patient Name: Date of Service: [name], [name] 07/27/2023 2:30 PM Medical Record Number: [id number] Patient Account Number: [id number] Date of Birth/Sex: Treating RN: [birth year] (53 y.o. [name] Primary Care Dermot Gremillion: Autry Rima Other Clinician: Referring Nnamdi Dacus: Treating Leo Weyandt/Extender: RO BSO N, MICHA EL KANDICE Autry, Robin Weeks in Treatment: 7 Visit Information History Since Last Visit Added or deleted any medications: No Patient Arrived: Ambulatory Any new allergies or adverse reactions: No Arrival Time: 14:42 Had a fall or experienced change in No Accompanied By: friend activities of daily living that may affect Transfer Assistance: None risk of falls: Patient Identification Verified: Yes Signs or symptoms of abuse/neglect since last visito No Secondary Verification Process Completed: Yes Hospitalized since last visit: No Patient Requires Transmission-Based Precautions: No Implantable device outside of the clinic excluding No Patient Has Alerts: Yes cellular tissue based products placed in the center Patient Alerts: Prediabetic since last visit: Has Dressing in Place as Prescribed: Yes Has Compression in Place as Prescribed: Yes Pain Present Now: No Electronic Signature(s) Signed: 07/27/2023 3:51:04 PM By: Alverta Sailors RN Entered By: Alverta [name] on 07/27/2023 14:44:22 -------------------------------------------------------------------------------- Clinic Level of Care Assessment Details Patient Name: Date of Service: [name], [name] 07/27/2023 2:30 PM Medical Record Number: [id number] Patient Account Number: [id number] Date of Birth/Sex: Treating RN: 1971-04-08 (53 y.o. [name] Primary Care Kainoah Bartosiewicz: Autry Rima Other Clinician: Referring Clifton Kovacic: Treating Yarisbel Miranda/Extender: RO BSO N, MICHA EL KANDICE Autry,  Robin Weeks in Treatment: 7 Clinic Level of Care Assessment Items TOOL 1 Quantity Score []  - 0 Use when EandM and Procedure is performed on INITIAL visit ASSESSMENTS - Nursing Assessment / Reassessment []  - 0 General Physical Exam (combine w/ comprehensive assessment (listed just below) when performed on new pt. evals) []  - 0 Comprehensive Assessment (HX, ROS, Risk Assessments, Wounds Hx, etc.) Pesch, Jeaninne ([id number]) [id number].pdf Page 2 of 9 ASSESSMENTS - Wound and Skin Assessment / Reassessment []  - 0 Dermatologic / Skin Assessment (not related to wound area) ASSESSMENTS - Ostomy and/or Continence Assessment and Care []  - 0 Incontinence Assessment and Management []  - 0 Ostomy Care Assessment and Management (repouching, etc.) PROCESS - Coordination of Care []  - 0 Simple Patient / Family Education for ongoing care []  - 0 Complex (extensive) Patient / Family Education for ongoing care []  - 0 Staff obtains Chiropractor, Records, T Results / Process Orders est []  - 0 Staff telephones HHA, Nursing Homes / Clarify orders / etc []  - 0 Routine Transfer to another Facility (non-emergent condition) []  - 0 Routine Hospital Admission (non-emergent condition) []  - 0 New Admissions / Manufacturing Engineer / Ordering NPWT Apligraf, etc. , []  - 0 Emergency Hospital Admission (emergent condition) PROCESS - Special Needs []  - 0 Pediatric / Minor Patient Management []  - 0 Isolation Patient Management []  - 0 Hearing / Language / Visual special needs []  - 0 Assessment of Community assistance (transportation, D/C planning, etc.) []  - 0 Additional assistance / Altered mentation []  - 0 Support Surface(s) Assessment (bed, cushion, seat, etc.) INTERVENTIONS - Miscellaneous []  - 0 External ear exam []  - 0 Patient Transfer (multiple staff / Nurse, Adult / Similar devices) []  - 0 Simple Staple / Suture removal (25 or less) []  - 0 Complex Staple / Suture  removal (26 or more) []  - 0 Hypo/Hyperglycemic Management (do not check if billed separately) []  - 0 Ankle / Brachial Index (ABI) - do not check if  billed separately Has the patient been seen at the hospital within the last three years: Yes Total Score: 0 Level Of Care: ____ Electronic Signature(s) Signed: 07/27/2023 3:51:04 PM By: Alverta Sailors RN Entered By: Alverta [name] on 07/27/2023 15:19:03 -------------------------------------------------------------------------------- Encounter Discharge Information Details Patient Name: Date of Service: [name], [name] 07/27/2023 2:30 PM Medical Record Number: [id number] Patient Account Number: [id number] Date of Birth/Sex: Treating RN: 1970-08-20 (53 y.o. [name] Primary Care [name]: Autry Rima Other Clinician: Referring [name]: Treating [name]/Extender: [id number] [name]., MICHA EL KANDICE Autry Rima [place], [name] ([id number]) [id number] Page 3 of 9 Weeks in Treatment: 7 Encounter Discharge Information Items Post Procedure Vitals Discharge Condition: Stable Temperature (F): 97.9 Ambulatory Status: Ambulatory Pulse (bpm): 93 Discharge Destination: Home Respiratory Rate (breaths/min): 18 Transportation: Private Auto Blood Pressure (mmHg): 158/78 Accompanied By: friend Schedule Follow-up Appointment: Yes Clinical Summary of Care: Electronic Signature(s) Signed: 07/27/2023 3:51:04 PM By: Alverta Sailors RN Entered By: Alverta [name] on 07/27/2023 15:27:21 -------------------------------------------------------------------------------- Lower Extremity Assessment Details Patient Name: Date of Service: [name], [name] 07/27/2023 2:30 PM Medical Record Number: [id number] Patient Account Number: [id number] Date of Birth/Sex: Treating RN: [birth year] (53 y.o. [name] Primary Care Merly Hinkson: Autry Rima Other Clinician: Referring [name]: Treating [name]/Extender: RO BSO N, MICHA EL KANDICE Autry,  Robin Weeks in Treatment: 7 Edema Assessment Assessed: [Left: Yes] [Right: No] Edema: [Left: Ye] [Right: s] Calf Left: Right: Point of Measurement: 33 cm From Medial Instep 52 cm Ankle Left: Right: Point of Measurement: 12 cm From Medial Instep 26 cm Vascular Assessment Pulses: Dorsalis Pedis Palpable: [Right:Yes] Extremity colors, hair growth, and conditions: Extremity Color: [Right:Red] Temperature of Extremity: [Right:Warm] Capillary Refill: [Right:< 3 seconds] Dependent Rubor: [Right:Yes] Blanched when Elevated: [Right:No Yes] Toe Nail Assessment Left: Right: Thick: Yes Discolored: Yes Deformed: Yes Improper Length and Hygiene: Yes Electronic Signature(sMadailein Londo, Cartha ([id number]) [phone].pdf Page 4 of 9 Signed: 07/27/2023 3:51:04 PM By: Alverta Sailors RN Entered By: Alverta [name] on 07/27/2023 14:50:28 -------------------------------------------------------------------------------- Multi Wound Chart Details Patient Name: Date of Service: [name], [name] 07/27/2023 2:30 PM Medical Record Number: [id number] Patient Account Number: [id number] Date of Birth/Sex: Treating RN: April 02, 1971 (53 y.o. [name] Primary Care Ramiah Helfrich: Autry Rima Other Clinician: Referring Krystalle Pilkington: Treating Vania Rosero/Extender: RO BSO N, MICHA EL KANDICE Autry, Robin Weeks in Treatment: 7 Vital Signs Height(in): 61 Pulse(bpm): 93 Weight(lbs): 330 Blood Pressure(mmHg): 158/78 Body Mass Index(BMI): 62.3 Temperature(F): 97.9 Respiratory Rate(breaths/min): 18 [1:Photos:] [N/A:N/A] Right, Lateral Lower Leg N/A N/A Wound Location: Gradually Appeared N/A N/A Wounding Event: Atypical N/A N/A Primary Etiology: Chronic Obstructive Pulmonary N/A N/A Comorbid History: Disease (COPD), Sleep Apnea, Congestive Heart Failure, Neuropathy 04/06/2023 N/A N/A Date Acquired: 7 N/A N/A Weeks of Treatment: Open N/A N/A Wound Status: No N/A N/A Wound  Recurrence: 1.4x2x0.1 N/A N/A Measurements L x W x D (cm) 2.199 N/A N/A A (cm) : rea 0.22 N/A N/A Volume (cm) : 93.00% N/A N/A % Reduction in Area: 93.00% N/A N/A % Reduction in Volume: Full Thickness Without Exposed N/A N/A Classification: Support Structures Medium N/A N/A Exudate Amount: Serosanguineous N/A N/A Exudate Type: red, brown N/A N/A Exudate Color: Small (1-33%) N/A N/A Granulation Amount: Red, Pink N/A N/A Granulation Quality: Large (67-100%) N/A N/A Necrotic Amount: Fat Layer (Subcutaneous Tissue): Yes N/A N/A Exposed Structures: Fascia: No Tendon: No Muscle: No Joint: No Bone: No Small (1-33%) N/A N/A Epithelialization: Treatment Notes Roseboom, Zamira ([id number]) [id number] Page 5 of 9 Electronic Signature(s) Signed: 07/27/2023 3:51:04 PM By: Alverta Sailors RN Entered By:  Alverta [name] on 07/27/2023 14:50:32 -------------------------------------------------------------------------------- Multi-Disciplinary Care Plan Details Patient Name: Date of Service: [name], Joseline 07/27/2023 2:30 PM Medical Record Number: [id number] Patient Account Number: [id number] Date of Birth/Sex: Treating RN: 1970-10-18 (53 y.o. [name] Primary Care Dontee Jaso: Autry Rima Other Clinician: Referring Amair Shrout: Treating Maudie Shingledecker/Extender: RO BSO N, MICHA EL KANDICE Autry, Robin Weeks in Treatment: 7 Active Inactive Wound/Skin Impairment Nursing Diagnoses: Impaired tissue integrity Knowledge deficit related to smoking impact on wound healing Knowledge deficit related to ulceration/compromised skin integrity Goals: Patient will demonstrate a reduced rate of smoking or cessation of smoking Date Initiated: 06/07/2023 Date Inactivated: 07/07/2023 Target Resolution Date: 07/08/2023 Goal Status: Met Patient/caregiver will verbalize understanding of skin care regimen Date Initiated: 06/07/2023 Date Inactivated: 07/07/2023 Target Resolution  Date: 07/07/2023 Goal Status: Met Ulcer/skin breakdown will have a volume reduction of 30% by week 4 Date Initiated: 06/07/2023 Date Inactivated: 07/07/2023 Target Resolution Date: 07/07/2023 Goal Status: Met Ulcer/skin breakdown will have a volume reduction of 50% by week 8 Date Initiated: 06/07/2023 Target Resolution Date: 08/07/2023 Goal Status: Active Ulcer/skin breakdown will have a volume reduction of 80% by week 12 Date Initiated: 06/07/2023 Target Resolution Date: 09/07/2023 Goal Status: Active Interventions: Assess patient/caregiver ability to obtain necessary supplies Assess patient/caregiver ability to perform ulcer/skin care regimen upon admission and as needed Assess ulceration(s) every visit Provide education on smoking Provide education on ulcer and skin care Treatment Activities: Skin care regimen initiated : 06/07/2023 Notes: Electronic Signature(s) Signed: 07/27/2023 3:51:04 PM By: Alverta Sailors RN Entered By: Alverta [name] on 07/27/2023 14:50:38 Deveney, DICKEY ([id number]) [id number] Page 6 of 9 -------------------------------------------------------------------------------- Pain Assessment Details Patient Name: Date of Service: [name], [name] 07/27/2023 2:30 PM Medical Record Number: [id number] Patient Account Number: [id number] Date of Birth/Sex: Treating RN: [birth year] (53 y.o. [name] Primary Care Latish Toutant: Autry Rima Other Clinician: Referring Inna Tisdell: Treating Anam Bobby/Extender: RO BSO N, MICHA EL KANDICE Autry, Robin Weeks in Treatment: 7 Active Problems Location of Pain Severity and Description of Pain Patient Has Paino No Site Locations Pain Management and Medication Current Pain Management: Electronic Signature(s) Signed: 07/27/2023 3:51:04 PM By: Alverta Sailors RN Entered By: Alverta [name] on 07/27/2023 14:44:53 -------------------------------------------------------------------------------- Patient/Caregiver  Education Details Patient Name: Date of Service: [name], [name] 12/31/2024andnbsp2:30 PM Medical Record Number: [id number] Patient Account Number: [id number] Date of Birth/Gender: Treating RN: [birth year] (53 y.o. [name] Primary Care Physician: Autry Rima Other Clinician: Referring Physician: Treating Physician/Extender: RO BSO N, MICHA EL KANDICE Autry, Rima Duos in Treatment: 7 [place], Jazzalyn ([id number]) [id number] Page 7 of 9 Education Assessment Education Provided To: Patient Education Topics Provided Wound/Skin Impairment: Handouts: Caring for Your Ulcer Methods: Explain/Verbal Responses: State content correctly Electronic Signature(s) Signed: 07/27/2023 3:51:04 PM By: Alverta Sailors RN Entered By: Alverta [name] on 07/27/2023 14:50:51 -------------------------------------------------------------------------------- Wound Assessment Details Patient Name: Date of Service: [name], [name] 07/27/2023 2:30 PM Medical Record Number: [id number] Patient Account Number: [id number] Date of Birth/Sex: Treating RN: 05-Jan-1971 (53 y.o. [name] Primary Care Lacy Taglieri: Autry Rima Other Clinician: Referring Kineta Fudala: Treating Ameer Sanden/Extender: RO BSO N, MICHA EL KANDICE Autry, Robin Weeks in Treatment: 7 Wound Status Wound Number: 1 Primary Atypical Etiology: Wound Location: Right, Lateral Lower Leg Wound Open Wounding Event: Gradually Appeared Status: Date Acquired: 04/06/2023 Comorbid Chronic Obstructive Pulmonary Disease (COPD), Sleep Apnea, Weeks Of Treatment: 7 History: Congestive Heart Failure, Neuropathy Clustered Wound: No Photos Wound Measurements Length: (cm) 1.4 Width: (cm) 2 Depth: (cm) 0.1 Area: (cm) 2.199 Volume: (cm) 0.22 % Reduction in Area: 93% %  Reduction in Volume: 93% Epithelialization: Small (1-33%) Tunneling: No Undermining: No Wound Description Classification: Full Thickness Without Exposed Support Exudate  Amount: Medium Exudate Type: Serosanguineous Dillinger, Fiora ([id number]) Exudate Color: red, brown Structures Foul Odor After Cleansing: No Slough/Fibrino Yes [phone].pdf Page 8 of 9 Wound Bed Granulation Amount: Small (1-33%) Exposed Structure Granulation Quality: Red, Pink Fascia Exposed: No Necrotic Amount: Large (67-100%) Fat Layer (Subcutaneous Tissue) Exposed: Yes Necrotic Quality: Adherent Slough Tendon Exposed: No Muscle Exposed: No Joint Exposed: No Bone Exposed: No Treatment Notes Wound #1 (Lower Leg) Wound Laterality: Right, Lateral Cleanser Soap and Water Discharge Instruction: Gently cleanse wound with antibacterial soap, rinse and pat dry prior to dressing wounds Vashe 5.8 (oz) Discharge Instruction: Use vashe 5.8 (oz) as directed Peri-Wound Care Topical Primary Dressing Hydrofera Blue Ready Transfer Foam, 2.5x2.5 (in/in) Discharge Instruction: Apply Hydrofera Blue Ready to wound bed as directed Secondary Dressing Zetuvit Plus 4x4 (in/in) Secured With Compression Wrap tubi grip Discharge Instruction: size D double layer Compression Stockings Add-Ons Electronic Signature(s) Signed: 07/27/2023 3:51:04 PM By: Alverta Sailors RN Entered By: Alverta [name] on 07/27/2023 14:49:44 -------------------------------------------------------------------------------- Vitals Details Patient Name: Date of Service: [name], [name] 07/27/2023 2:30 PM Medical Record Number: [id number] Patient Account Number: [id number] Date of Birth/Sex: Treating RN: [birth year] (53 y.o. [name] Primary Care Kenney Going: Autry Rima Other Clinician: Referring Chayil Gantt: Treating Armonie Staten/Extender: RO BSO N, MICHA EL KANDICE Autry, Robin Weeks in Treatment: 7 Vital Signs Time Taken: 14:44 Temperature (F): 97.9 Height (in): 61 Pulse (bpm): 93 Weight (lbs): 330 Respiratory Rate (breaths/min): 18 Body Mass Index (BMI): 62.3 Blood Pressure (mmHg):  158/78 Reference Range: 80 - 120 mg / dl Neuenfeldt, Loyda ([id number]) [id number] Page 9 of 9 Electronic Signature(s) Signed: 07/27/2023 3:51:04 PM By: Alverta Sailors RN Entered By: Alverta [name] on 07/27/2023 14:44:43

## 2023-08-05 ENCOUNTER — Encounter: Payer: 59 | Attending: Physician Assistant | Admitting: Physician Assistant

## 2023-08-05 DIAGNOSIS — L03115 Cellulitis of right lower limb: Secondary | ICD-10-CM | POA: Diagnosis present

## 2023-08-05 DIAGNOSIS — J4489 Other specified chronic obstructive pulmonary disease: Secondary | ICD-10-CM | POA: Diagnosis not present

## 2023-08-05 DIAGNOSIS — I87331 Chronic venous hypertension (idiopathic) with ulcer and inflammation of right lower extremity: Secondary | ICD-10-CM | POA: Insufficient documentation

## 2023-08-05 DIAGNOSIS — F17218 Nicotine dependence, cigarettes, with other nicotine-induced disorders: Secondary | ICD-10-CM | POA: Diagnosis not present

## 2023-08-05 DIAGNOSIS — R7303 Prediabetes: Secondary | ICD-10-CM | POA: Diagnosis not present

## 2023-08-05 DIAGNOSIS — G629 Polyneuropathy, unspecified: Secondary | ICD-10-CM | POA: Insufficient documentation

## 2023-08-05 DIAGNOSIS — L97812 Non-pressure chronic ulcer of other part of right lower leg with fat layer exposed: Secondary | ICD-10-CM | POA: Diagnosis not present

## 2023-08-05 DIAGNOSIS — G4730 Sleep apnea, unspecified: Secondary | ICD-10-CM | POA: Diagnosis not present

## 2023-08-05 DIAGNOSIS — I5042 Chronic combined systolic (congestive) and diastolic (congestive) heart failure: Secondary | ICD-10-CM | POA: Diagnosis not present

## 2023-08-05 NOTE — Progress Notes (Addendum)
 [name], [name] ([id number]) [id number] Page 1 of 7 Visit Report for 08/05/2023 Chief Complaint Document Details Patient Name: Date of Service: [name], [name] 08/05/2023 1:30 PM Medical Record Number: [id number] Patient Account Number: [id number] Date of Birth/Sex: Treating RN: 30-Jan-1971 (53 y.o. [name] Primary Care Provider: Autry Rima Other Clinician: Referring Provider: Treating Provider/Extender: Bethena Andre Autry Rima Weeks in Treatment: 8 Information Obtained from: Patient Chief Complaint Right leg venous leg ulcer Electronic Signature(s) Signed: 08/05/2023 1:24:27 PM By: Bethena Andre PA-C Entered By: Bethena Andre on 08/05/2023 10:24:27 -------------------------------------------------------------------------------- HPI Details Patient Name: Date of Service: [name], [name] 08/05/2023 1:30 PM Medical Record Number: [id number] Patient Account Number: [id number] Date of Birth/Sex: Treating RN: [birth year] (53 y.o. [name] Primary Care Provider: Autry Rima Other Clinician: Referring Provider: Treating Provider/Extender: Bethena Andre Autry Rima Weeks in Treatment: 8 History of Present Illness HPI Description: Notes from Hospital admission at [facility]: Cellulitis  Venous stasis wound: Presented with fairly significant lower extremity edema and weeping of a wound on her right lower extremity. This has been present for 6 to 8 weeks per the patient. A CT on admission demonstrated cellulitis without abscess or osteomyelitis. The superficial wound culture showed Pseudomonas. There were no risk factors otherwise for Pseudomonas. Overall, this was consistent with venous stasis edema and a mild superimposed cellulitis. She was treated initially with vancomycin for 2 days and then topical mupirocin added. Given the superficial culture showing Pseudomonas, started on IV ciprofloxacin as well as oral cephalexin for MSSA for treatment  of her cellulitis. Vancomycin was discontinued. Transitioned from IV to oral ciprofloxacin on the day of discharge, and continued both ciprofloxacin and cephalexin for a total 7-day course on discharge. Blood cultures negative. Pain was managed with Tylenol  and oxycodone as needed, which was also prescribed short term on discharge. Dressing changes performed frequently throughout the day due to significant weeping of her wound, with continuation of dressing changes including Silvadene on discharge. Patient has an appointment with the wound care clinic on November 11 at 1:45 PM Prediabetes (A1c 6.4%). Started on metformin  500mg  XL outpatient. Admission to wound care center [place], [place]: 06-07-2023 upon evaluation today patient appears to be doing somewhat poorly in regard to her right leg. She tells me that she has been having some issues [name] ([id number]) [id number] Page 2 of 7 here with infection. She does tell me that overall the infection has been treated in the hospital initially she was noted to have Pseudomonas and some mild staph. Subsequently she was put on Cipro and Keflex. With that being said she still has quite a bit of drainage as well as erythema she is nearing the completion of her antibiotics they gave oral they were given a total of 7 days of each the Keflex and the Cipro I do not think 7 days is gena be enough I discussed that with her today. With that being said I do believe after reviewing the notes above from [facility] but the patient is on the right track I just think we need to Cipro for a bit longer. Patient does have a history of congestive heart failure, nicotine dependence, COPD, prediabetes, and again chronic venous hypertension. 06-14-2023 upon evaluation today patient appears to be doing well currently in regard to her wound. She has been tolerating the dressing changes without complication. Fortunately I do not see any  signs of active infection at this time which is great news. No fevers, chills, nausea, vomiting, or diarrhea. Upon inspection  patient's wound bed actually showed signs of good granulation and epithelization at this point. I actually did perform some debridement clearway necrotic debris today she tolerated that debridement without complication and postdebridement the wound bed is significantly improved. 06-21-2023 upon evaluation today patient's wound is actually showing signs of significant improvement. This is really good news. Fortunately I do not see any signs of infection and overall I believe that we are making really good headway here towards closure which is great news 06-28-2023 upon evaluation today patient appears to be doing well currently in regard to her wound which is actually showing some signs of improvement this has come along way. With that being said it does have some new skin around the edge she did not have any of the XtraSorb to put on we have tried to get her some of the dressing supplies we had here but unfortunately they did not get a message in time. 07-07-2023 upon evaluation today patient appears to be doing well currently in regard to her wound. She has been tolerating the dressing changes without complication. This is slowly getting smaller and better. With that being said her legs are red but I am not sure this is actually due to cellulitis I am going to perform a little bit of debridement clearway necrotic debris on the 19th a culture as well and we will see what the results of the culture show depending on this finding we will see whether or not we need to initiate any antibiotic therapy. She is actually in agreement with this plan. 07-14-2023 upon evaluation today patient appears to be doing well currently in regard to her wound. She has been tolerating the dressing changes without complication. Fortunately I think her making good progress she does have a bacterial  infection noted I did send in the antibiotic for her she is can be picking that up today that is Augmentin. She has not gotten it filled as of yet. 12/27; comes in today with very significant increase in her leg swelling. The wound is on the right posterior lateral calf. Small open area some eschar around this. We have been using Hydrofera Blue/ zetuvit/Tubigrip double D 12/13; 4 days ago I put this lady in compression as her leg was much more swollen and weeping edema fluid through the wound on the right lateral lower leg. She said the wrap fell down and she much prefers the double D Tubigrip's we used Hydrofera Blue is the primary dressing 08-05-23 upon evaluation today patient appears to be doing excellent in regard to her wound. This actually showing signs of improvement and in fact I think is healed. Electronic Signature(s) Signed: 08/05/2023 2:12:47 PM By: Bethena Ferraris PA-C Entered By: Bethena Ferraris on 08/05/2023 11:12:46 -------------------------------------------------------------------------------- Physical Exam Details Patient Name: Date of Service: [name], [name] 08/05/2023 1:30 PM Medical Record Number: [id number] Patient Account Number: [id number] Date of Birth/Sex: Treating RN: 12-27-1970 (53 y.o. [name] Primary Care Provider: Autry Rima Other Clinician: Referring Provider: Treating Provider/Extender: Bethena Ferraris Autry Rima Weeks in Treatment: 8 Constitutional Well-nourished and well-hydrated in no acute distress. Respiratory normal breathing without difficulty. Psychiatric this patient is able to make decisions and demonstrates good insight into disease process. Alert and Oriented x 3. pleasant and cooperative. Notes Patient's wound bed showed signs of being completely healed I do not see any signs of opening and overall I believe that we are making really good progress here. I think that she should continue with compression to keep this under  control otherwise I  am really excited that she is doing so well. Electronic Signature(s) Knik-[place]view, DICKEY ([id number]) [id number] Page 3 of 7 Signed: 08/05/2023 2:13:28 PM By: Bethena Ferraris PA-C Entered By: Bethena Ferraris on 08/05/2023 11:13:28 -------------------------------------------------------------------------------- Physician Orders Details Patient Name: Date of Service: [name], [name] 08/05/2023 1:30 PM Medical Record Number: [id number] Patient Account Number: [id number] Date of Birth/Sex: Treating RN: [birth year] (53 y.o. [name] Primary Care Provider: Autry Rima Other Clinician: Referring Provider: Treating Provider/Extender: Bethena Ferraris Autry Rima Weeks in Treatment: 8 The following information was scribed by: Alverta [name] The information was scribed for: Bethena Ferraris Verbal / Phone Orders: No Diagnosis Coding ICD-10 Coding Code Description 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 lower extremity L03.115 Cellulitis of right lower limb L97.812 Non-pressure chronic ulcer of other part of right lower leg with fat layer exposed R73.03 Prediabetes J44.89 Other specified chronic obstructive pulmonary disease F17.218 Nicotine dependence, cigarettes, with other nicotine-induced disorders I50.42 Chronic combined systolic (congestive) and diastolic (congestive) heart failure Discharge From [facility] Discharge from Wound Care Center Treatment Complete Wear compression garments daily. Put garments on first thing when you wake up and remove them before bed. Moisturize legs daily after removing compression garments. Elevate, Exercise Daily and A void Standing for Long Periods of Time. Electronic Signature(s) Signed: 08/05/2023 3:53:53 PM By: Bethena Ferraris PA-C Signed: 08/06/2023 8:27:37 AM By: Alverta Sailors RN Entered By: Alverta [name] on 08/05/2023  10:57:19 -------------------------------------------------------------------------------- Problem List Details Patient Name: Date of Service: [name], [name] 08/05/2023 1:30 PM Medical Record Number: [id number] Patient Account Number: [id number] Date of Birth/Sex: Treating RN: [birth year] (53 y.o. [name] Primary Care Provider: Autry Rima Other Clinician: Referring Provider: Treating Provider/Extender: Bethena Ferraris Autry Rima Devra in Treatment: 8 [place], [place] ([id number]) [id number] Page 4 of 7 Active Problems ICD-10 Encounter Code Description Active Date MDM Diagnosis 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 06/07/2023 No Yes lower extremity L03.115 Cellulitis of right lower limb 06/07/2023 No Yes L97.812 Non-pressure chronic ulcer of other part of right lower leg with fat layer 06/07/2023 No Yes exposed R73.03 Prediabetes 06/07/2023 No Yes J44.89 Other specified chronic obstructive pulmonary disease 06/07/2023 No Yes F17.218 Nicotine dependence, cigarettes, with other nicotine-induced disorders 06/07/2023 No Yes I50.42 Chronic combined systolic (congestive) and diastolic (congestive) heart failure 06/07/2023 No Yes Inactive Problems Resolved Problems Electronic Signature(s) Signed: 08/05/2023 1:24:18 PM By: Bethena Ferraris PA-C Entered By: Bethena Ferraris on 08/05/2023 10:24:18 -------------------------------------------------------------------------------- Progress Note Details Patient Name: Date of Service: [name], [name] 08/05/2023 1:30 PM Medical Record Number: [id number] Patient Account Number: [id number] Date of Birth/Sex: Treating RN: [birth year] (53 y.o. [name] Primary Care Provider: Autry Rima Other Clinician: Referring Provider: Treating Provider/Extender: Bethena Ferraris Autry Rima Weeks in Treatment: 8 Subjective Chief Complaint Information obtained from Patient Right leg  venous leg ulcer [name], [name] ([id number]) [id number] Page 5 of 7 History of Present Illness (HPI) Notes from Hospital admission at [facility]: Cellulitis  Venous stasis wound: Presented with fairly significant lower extremity edema and weeping of a wound on her right lower extremity. This has been present for 6 to 8 weeks per the patient. A CT on admission demonstrated cellulitis without abscess or osteomyelitis. The superficial wound culture showed Pseudomonas. There were no risk factors otherwise for Pseudomonas. Overall, this was consistent with venous stasis edema and a mild superimposed cellulitis. She was treated initially with vancomycin for 2 days and then topical mupirocin added. Given the superficial  culture showing Pseudomonas, started on IV ciprofloxacin as well as oral cephalexin for MSSA for treatment of her cellulitis. Vancomycin was discontinued. Transitioned from IV to oral ciprofloxacin on the day of discharge, and continued both ciprofloxacin and cephalexin for a total 7-day course on discharge. Blood cultures negative. Pain was managed with Tylenol  and oxycodone as needed, which was also prescribed short term on discharge. Dressing changes performed frequently throughout the day due to significant weeping of her wound, with continuation of dressing changes including Silvadene on discharge. Patient has an appointment with the wound care clinic on November 11 at 1:45 PM Prediabetes (A1c 6.4%). Started on metformin  500mg  XL outpatient. Admission to wound care center [place], [place]: 06-07-2023 upon evaluation today patient appears to be doing somewhat poorly in regard to her right leg. She tells me that she has been having some issues here with infection. She does tell me that overall the infection has been treated in the hospital initially she was noted to have Pseudomonas and some mild staph. Subsequently she was put on Cipro and Keflex. With  that being said she still has quite a bit of drainage as well as erythema she is nearing the completion of her antibiotics they gave oral they were given a total of 7 days of each the Keflex and the Cipro I do not think 7 days is gena be enough I discussed that with her today. With that being said I do believe after reviewing the notes above from [facility] but the patient is on the right track I just think we need to Cipro for a bit longer. Patient does have a history of congestive heart failure, nicotine dependence, COPD, prediabetes, and again chronic venous hypertension. 06-14-2023 upon evaluation today patient appears to be doing well currently in regard to her wound. She has been tolerating the dressing changes without complication. Fortunately I do not see any signs of active infection at this time which is great news. No fevers, chills, nausea, vomiting, or diarrhea. Upon inspection patient's wound bed actually showed signs of good granulation and epithelization at this point. I actually did perform some debridement clearway necrotic debris today she tolerated that debridement without complication and postdebridement the wound bed is significantly improved. 06-21-2023 upon evaluation today patient's wound is actually showing signs of significant improvement. This is really good news. Fortunately I do not see any signs of infection and overall I believe that we are making really good headway here towards closure which is great news 06-28-2023 upon evaluation today patient appears to be doing well currently in regard to her wound which is actually showing some signs of improvement this has come along way. With that being said it does have some new skin around the edge she did not have any of the XtraSorb to put on we have tried to get her some of the dressing supplies we had here but unfortunately they did not get a message in time. 07-07-2023 upon evaluation today patient appears to be  doing well currently in regard to her wound. She has been tolerating the dressing changes without complication. This is slowly getting smaller and better. With that being said her legs are red but I am not sure this is actually due to cellulitis I am going to perform a little bit of debridement clearway necrotic debris on the 19th a culture as well and we will see what the results of the culture show depending on this finding we will see whether or not we need  to initiate any antibiotic therapy. She is actually in agreement with this plan. 07-14-2023 upon evaluation today patient appears to be doing well currently in regard to her wound. She has been tolerating the dressing changes without complication. Fortunately I think her making good progress she does have a bacterial infection noted I did send in the antibiotic for her she is can be picking that up today that is Augmentin. She has not gotten it filled as of yet. 12/27; comes in today with very significant increase in her leg swelling. The wound is on the right posterior lateral calf. Small open area some eschar around this. We have been using Hydrofera Blue/ zetuvit/Tubigrip double D 12/13; 4 days ago I put this lady in compression as her leg was much more swollen and weeping edema fluid through the wound on the right lateral lower leg. She said the wrap fell down and she much prefers the double D Tubigrip's we used Hydrofera Blue is the primary dressing 08-05-23 upon evaluation today patient appears to be doing excellent in regard to her wound. This actually showing signs of improvement and in fact I think is healed. Objective Constitutional Well-nourished and well-hydrated in no acute distress. Vitals Time Taken: 1:34 PM, Height: 61 in, Weight: 330 lbs, BMI: 62.3, Temperature: 97.9 F, Pulse: 58 bpm, Respiratory Rate: 18 breaths/min, Blood Pressure: 153/67 mmHg. Respiratory normal breathing without difficulty. Psychiatric this patient is  able to make decisions and demonstrates good insight into disease process. Alert and Oriented x 3. pleasant and cooperative. General Notes: Patient's wound bed showed signs of being completely healed I do not see any signs of opening and overall I believe that we are making really good progress here. I think that she should continue with compression to keep this under control otherwise I am really excited that she is doing so well. Integumentary (Hair, Skin) Wound #1 status is Healed - Epithelialized. Original cause of wound was Gradually Appeared. The date acquired was: 04/06/2023. The wound has been in treatment 8 weeks. The wound is located on the Right,Lateral Lower Leg. The wound measures 0cm length x 0cm width x 0cm depth; 0cm^2 area and 0cm^3 volume. There is no tunneling or undermining noted. There is a none present amount of drainage noted. There is no granulation within the wound bed. There is no necrotic tissue within the wound bed. [name], [name] ([id number]) [id number] Page 6 of 7 Assessment Active Problems ICD-10 Chronic venous hypertension (idiopathic) with ulcer and inflammation of right lower extremity Cellulitis of right lower limb Non-pressure chronic ulcer of other part of right lower leg with fat layer exposed Prediabetes Other specified chronic obstructive pulmonary disease Nicotine dependence, cigarettes, with other nicotine-induced disorders Chronic combined systolic (congestive) and diastolic (congestive) heart failure Plan Discharge From [facility] Services: Discharge from Wound Care Center Treatment Complete Wear compression garments daily. Put garments on first thing when you wake up and remove them before bed. Moisturize legs daily after removing compression garments. Elevate, Exercise Daily and Avoid Standing for Long Periods of Time. 1. I am going to recommend at this time that we have the patient going to discontinue wound care services  that she appears to be completely healed. 2. Also can recommend that she should continue to wear compression I think double layer Tubigrip will work if she can wear compression socks that is fine as well what ever she can be most compliant with. Will see her back for follow-up visit as needed. Electronic Signature(s) Signed: 08/05/2023 2:13:57 PM By:  Bethena Ferraris PA-C Entered By: Bethena Ferraris on 08/05/2023 11:13:57 -------------------------------------------------------------------------------- SuperBill Details Patient Name: Date of Service: [name], [name] 08/05/2023 Medical Record Number: [id number] Patient Account Number: [id number] Date of Birth/Sex: Treating RN: 06-Dec-1970 (53 y.o. [name] Primary Care Provider: Autry Rima Other Clinician: Referring Provider: Treating Provider/Extender: Bethena Ferraris Autry Rima Weeks in Treatment: 8 Diagnosis Coding ICD-10 Codes Code Description [phone] Chronic venous hypertension (idiopathic) with ulcer and inflammation of right lower extremity L03.115 Cellulitis of right lower limb L97.812 Non-pressure chronic ulcer of other part of right lower leg with fat layer exposed R73.03 Prediabetes J44.89 Other specified chronic obstructive pulmonary disease F17.218 Nicotine dependence, cigarettes, with other nicotine-induced disorders I50.42 Chronic combined systolic (congestive) and diastolic (congestive) heart failure Kuba, Kyliana ([id number]) [id number] Page 7 of 7 Facility Procedures : CPT4 Code: [id number] Description: [id number] - WOUND CARE VISIT-LEV 2 EST PT Modifier: Quantity: 1 Physician Procedures : CPT4 Code Description Modifier [id number] - WC PHYS LEVEL 3 - EST PT ICD-10 Diagnosis Description 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 lower extremity L03.115 Cellulitis of right lower limb L97.812  Non-pressure chronic ulcer of other part of right lower leg  with fat layer exposed R73.03 Prediabetes Quantity: 1 Electronic Signature(s) Signed: 08/05/2023 2:18:57 PM By: Bethena Ferraris PA-C Entered By: Bethena Ferraris on 08/05/2023 11:18:57

## 2023-08-05 NOTE — Progress Notes (Addendum)
 [name], [name] ([id number]) [id number] Page 1 of 7 Visit Report for 08/05/2023 Arrival Information Details Patient Name: Date of Service: [name] 08/05/2023 1:30 PM Medical Record Number: [id number] Patient Account Number: [id number] Date of Birth/Sex: Treating [name]: [name] (53 y.o. [name] Primary Care [name]: [name] Other Clinician: Referring [name]: Treating [name]/Extender: [name] [name] [name] in Treatment: 8 Visit Information History Since Last Visit Added or deleted any medications: No Patient Arrived: Ambulatory Any new allergies or adverse reactions: No Arrival Time: 13:32 Had a fall or experienced change in No Accompanied By: friend activities of daily living that may affect Transfer Assistance: None risk of falls: Patient Identification Verified: Yes Signs or symptoms of abuse/neglect since last visito No Secondary Verification Process Completed: Yes Hospitalized since last visit: No Patient Requires Transmission-Based Precautions: No Implantable device outside of the clinic excluding No Patient Has Alerts: Yes cellular tissue based products placed in the center Patient Alerts: Prediabetic since last visit: Has Dressing in Place as Prescribed: Yes Has Compression in Place as Prescribed: Yes Pain Present Now: No Electronic Signature(s) Signed: 08/06/2023 8:27:37 AM By: [name] Entered By: [name] on 08/05/2023 10:34:02 -------------------------------------------------------------------------------- Clinic Level of Care Assessment Details Patient Name: Date of Service: [name] 08/05/2023 1:30 PM Medical Record Number: [id number] Patient Account Number: [id number] Date of Birth/Sex: Treating [name]: [name] (53 y.o. [name] Primary Care [name]: [name] Other Clinician: Referring [name]: Treating [name]/Extender: [name] [name] Weeks in Treatment:  8 Clinic Level of Care Assessment Items TOOL 4 Quantity Score X- 1 0 Use when only an EandM is performed on FOLLOW-UP visit ASSESSMENTS - Nursing Assessment / Reassessment X- 1 10 Reassessment of Co-morbidities (includes updates in patient status) X- 1 5 Reassessment of Adherence to Treatment Plan [name], [name] ([id number]) [id number] Page 2 of 7 ASSESSMENTS - Wound and Skin A ssessment / Reassessment X - Simple Wound Assessment / Reassessment - one wound 1 5 []  - 0 Complex Wound Assessment / Reassessment - multiple wounds []  - 0 Dermatologic / Skin Assessment (not related to wound area) ASSESSMENTS - Focused Assessment []  - 0 Circumferential Edema Measurements - multi extremities []  - 0 Nutritional Assessment / Counseling / Intervention []  - 0 Lower Extremity Assessment (monofilament, tuning fork, pulses) []  - 0 Peripheral Arterial Disease Assessment (using hand held doppler) ASSESSMENTS - Ostomy and/or Continence Assessment and Care []  - 0 Incontinence Assessment and Management []  - 0 Ostomy Care Assessment and Management (repouching, etc.) PROCESS - Coordination of Care X - Simple Patient / Family Education for ongoing care 1 15 []  - 0 Complex (extensive) Patient / Family Education for ongoing care []  - 0 Staff obtains Chiropractor, Records, T Results / Process Orders est []  - 0 Staff telephones HHA, Nursing Homes / Clarify orders / etc []  - 0 Routine Transfer to another Facility (non-emergent condition) []  - 0 Routine Hospital Admission (non-emergent condition) []  - 0 New Admissions / Manufacturing Engineer / Ordering NPWT Apligraf, etc. , []  - 0 Emergency Hospital Admission (emergent condition) X- 1 10 Simple Discharge Coordination []  - 0 Complex (extensive) Discharge Coordination PROCESS - Special Needs []  - 0 Pediatric / Minor Patient Management []  - 0 Isolation Patient Management []  - 0 Hearing / Language / Visual special  needs []  - 0 Assessment of Community assistance (transportation, D/C planning, etc.) []  - 0 Additional assistance / Altered mentation []  - 0 Support Surface(s) Assessment (bed, cushion, seat, etc.) INTERVENTIONS - Wound Cleansing / Measurement []  -  0 Simple Wound Cleansing - one wound []  - 0 Complex Wound Cleansing - multiple wounds []  - 0 Wound Imaging (photographs - any number of wounds) []  - 0 Wound Tracing (instead of photographs) []  - 0 Simple Wound Measurement - one wound []  - 0 Complex Wound Measurement - multiple wounds INTERVENTIONS - Wound Dressings []  - 0 Small Wound Dressing one or multiple wounds []  - 0 Medium Wound Dressing one or multiple wounds []  - 0 Large Wound Dressing one or multiple wounds []  - 0 Application of Medications - topical []  - 0 Application of Medications - injection INTERVENTIONS - Miscellaneous []  - 0 External ear exam [name], [name] ([id number]) [id number] Page 3 of 7 []  - 0 Specimen Collection (cultures, biopsies, blood, body fluids, etc.) []  - 0 Specimen(s) / Culture(s) sent or taken to Lab for analysis []  - 0 Patient Transfer (multiple staff / Deitra Lift / Similar devices) []  - 0 Simple Staple / Suture removal (25 or less) []  - 0 Complex Staple / Suture removal (26 or more) []  - 0 Hypo / Hyperglycemic Management (close monitor of Blood Glucose) []  - 0 Ankle / Brachial Index (ABI) - do not check if billed separately X- 1 5 Vital Signs Has the patient been seen at the hospital within the last three years: Yes Total Score: 50 Level Of Care: New/Established - Level 2 Electronic Signature(s) Signed: 08/06/2023 8:27:37 AM By: [name] Entered By: [name] on 08/05/2023 10:57:45 -------------------------------------------------------------------------------- Lower Extremity Assessment Details Patient Name: Date of Service: [name] 08/05/2023 1:30 PM Medical Record Number:  [id number] Patient Account Number: [id number] Date of Birth/Sex: Treating [name]: [name] (53 y.o. [name] Primary Care [name]: [name] Other Clinician: Referring Crisanto Nied: Treating Avianna Moynahan/Extender: [name] [name] Weeks in Treatment: 8 Electronic Signature(s) Signed: 08/06/2023 8:27:37 AM By: [name] Entered By: [name] on 08/05/2023 10:55:35 -------------------------------------------------------------------------------- Multi Wound Chart Details Patient Name: Date of Service: [name] RD, [name] 08/05/2023 1:30 PM Medical Record Number: [id number] Patient Account Number: [id number] Date of Birth/Sex: Treating [name]: [birth year] (53 y.o. [name] Primary Care Precilla Purnell: [name] Other Clinician: Referring Corinthian Kemler: Treating Landis Dowdy/Extender: [name] [name] Weeks in Treatment: 8 Vital Signs Height(in): 61 Pulse(bpm): 58 [name], [name] ([id number]) [id number] Page 4 of 7 Weight(lbs): 330 Blood Pressure(mmHg): 153/67 Body Mass Index(BMI): 62.3 Temperature(F): 97.9 Respiratory Rate(breaths/min): 18 [1:Photos:] [N/A:N/A] Right, Lateral Lower Leg N/A N/A Wound Location: Gradually Appeared N/A N/A Wounding Event: Atypical N/A N/A Primary Etiology: Chronic Obstructive Pulmonary N/A N/A Comorbid History: Disease (COPD), Sleep Apnea, Congestive Heart Failure, Neuropathy 04/06/2023 N/A N/A Date Acquired: 8 N/A N/A Weeks of Treatment: Healed - Epithelialized N/A N/A Wound Status: No N/A N/A Wound Recurrence: 0x0x0 N/A N/A Measurements L x W x D (cm) 0 N/A N/A A (cm) : rea 0 N/A N/A Volume (cm) : 100.00% N/A N/A % Reduction in Area: 100.00% N/A N/A % Reduction in Volume: Full Thickness Without Exposed N/A N/A Classification: Support Structures None Present N/A N/A Exudate Amount: None Present (0%) N/A N/A Granulation Amount: None Present (0%) N/A N/A Necrotic Amount: Fascia: No N/A  N/A Exposed Structures: Fat Layer (Subcutaneous Tissue): No Tendon: No Muscle: No Joint: No Bone: No Large (67-100%) N/A N/A Epithelialization: Treatment Notes Electronic Signature(s) Signed: 08/06/2023 8:27:37 AM By: [name] Entered By: [name] on 08/05/2023 10:55:40 -------------------------------------------------------------------------------- Multi-Disciplinary Care Plan Details Patient Name: Date of Service: [name] RD, [name] 08/05/2023 1:30 PM Medical Record Number: [id number] Patient Account Number: [id number] Date  of Birth/Sex: Treating [name]: [name] (53 y.o. [name] Primary Care Haidan Nhan: [name] Other Clinician: Referring Kylen Ismael: Treating Natalyia Innes/Extender: [name] [name] [name] in Treatment: [address] [place], [name] ([id number]) [id number] Page 5 of 7 Electronic Signature(s) Signed: 08/06/2023 8:27:37 AM By: [name] Entered By: [name] on 08/05/2023 10:58:10 -------------------------------------------------------------------------------- Pain Assessment Details Patient Name: Date of Service: [name] RD, [name] 08/05/2023 1:30 PM Medical Record Number: [id number] Patient Account Number: [id number] Date of Birth/Sex: Treating [name]: [name] (53 y.o. [name] Primary Care Tarance Balan: [name] Other Clinician: Referring Vietta Bonifield: Treating Latonia Conrow/Extender: [name] [name] Weeks in Treatment: 8 Active Problems Location of Pain Severity and Description of Pain Patient Has Paino No Site Locations Pain Management and Medication Current Pain Management: Electronic Signature(s) Signed: 08/06/2023 8:27:37 AM By: [name] Entered By: [name] on 08/05/2023 10:35:24 -------------------------------------------------------------------------------- Patient/Caregiver Education Details Patient Name: Date of Service: [name] RD, [name] 1/9/2025andnbsp1:30 PM [place],  [name] ([id number]) [id number] Page 6 of 7 Medical Record Number: [id number] Patient Account Number: [id number] Date of Birth/Gender: Treating [name]: [name] (53 y.o. [name] Primary Care Physician: [name] Other Clinician: Referring Physician: Treating Physician/Extender: [name] [name] [name] in Treatment: 8 Education Assessment Education Provided To: Patient Education Topics Provided Wound/Skin Impairment: Handouts: Other: discharge instructions Methods: Explain/Verbal Responses: State content correctly Electronic Signature(s) Signed: 08/06/2023 8:27:37 AM By: [name] Entered By: [name] on 08/05/2023 10:58:27 -------------------------------------------------------------------------------- Wound Assessment Details Patient Name: Date of Service: [name] RD, [name] 08/05/2023 1:30 PM Medical Record Number: [id number] Patient Account Number: [id number] Date of Birth/Sex: Treating [name]: [birth year] [name] (53 y.o. [name] Primary Care Garrison Michie: [name] Other Clinician: Referring Roshni Burbano: Treating Mccartney Brucks/Extender: [name] [name] Weeks in Treatment: 8 Wound Status Wound Number: 1 Primary Atypical Etiology: Wound Location: Right, Lateral Lower Leg Wound Healed - Epithelialized Wounding Event: Gradually Appeared Status: Date Acquired: 04/06/2023 Comorbid Chronic Obstructive Pulmonary Disease (COPD), Sleep Apnea, Weeks Of Treatment: 8 History: Congestive Heart Failure, Neuropathy Clustered Wound: No Photos Wound Measurements Length: (cm) 0 Width: (cm) 0 Depth: (cm) 0 Area: (cm) 0 Volume: (cm) 0 [name], [name] ([id number]) % Reduction in Area: 100% % Reduction in Volume: 100% Epithelialization: Large (67-100%) Tunneling: No Undermining: No [id number] Page 7 of 7 Wound Description Classification: Full Thickness Without Exposed Support Exudate Amount: None  Present Structures Foul Odor After Cleansing: No Slough/Fibrino No Wound Bed Granulation Amount: None Present (0%) Exposed Structure Necrotic Amount: None Present (0%) Fascia Exposed: No Fat Layer (Subcutaneous Tissue) Exposed: No Tendon Exposed: No Muscle Exposed: No Joint Exposed: No Bone Exposed: No Electronic Signature(s) Signed: 08/06/2023 8:27:37 AM By: [name] Entered By: [name] on 08/05/2023 10:50:47 -------------------------------------------------------------------------------- Vitals Details Patient Name: Date of Service: [name] RD, [name] 08/05/2023 1:30 PM Medical Record Number: [id number] Patient Account Number: [id number] Date of Birth/Sex: Treating [name]: [name] (53 y.o. [name] Primary Care Emiliya Chretien: [name] Other Clinician: Referring Lyla Jasek: Treating [name]/Extender: [name] [name] Weeks in Treatment: 8 Vital Signs Time Taken: 13:34 Temperature (F): 97.9 Height (in): 61 Pulse (bpm): 58 Weight (lbs): 330 Respiratory Rate (breaths/min): 18 Body Mass Index (BMI): 62.3 Blood Pressure (mmHg): 153/67 Reference Range: 80 - 120 mg / dl Electronic Signature(s) Signed: 08/06/2023 8:27:37 AM By: [name] Entered By: [name] on 08/05/2023 10:34:30
